# Patient Record
Sex: Male | Born: 1940
Health system: Southern US, Community
[De-identification: ages and names within clinical notes are randomized; demographics above are authoritative.]

## PROBLEM LIST (undated history)

## (undated) DIAGNOSIS — M7020 Olecranon bursitis, unspecified elbow: Secondary | ICD-10-CM

## (undated) DIAGNOSIS — N3281 Overactive bladder: Secondary | ICD-10-CM

## (undated) DIAGNOSIS — E291 Testicular hypofunction: Secondary | ICD-10-CM

## (undated) DIAGNOSIS — G629 Polyneuropathy, unspecified: Secondary | ICD-10-CM

## (undated) DIAGNOSIS — R9431 Abnormal electrocardiogram [ECG] [EKG]: Secondary | ICD-10-CM

## (undated) DIAGNOSIS — R42 Dizziness and giddiness: Secondary | ICD-10-CM

## (undated) DIAGNOSIS — R001 Bradycardia, unspecified: Secondary | ICD-10-CM

## (undated) DIAGNOSIS — I951 Orthostatic hypotension: Secondary | ICD-10-CM

## (undated) DIAGNOSIS — M47819 Spondylosis without myelopathy or radiculopathy, site unspecified: Secondary | ICD-10-CM

## (undated) DIAGNOSIS — F0390 Unspecified dementia without behavioral disturbance: Secondary | ICD-10-CM

## (undated) HISTORY — PX: TOTAL HIP ARTHROPLASTY: SHX124

## (undated) HISTORY — DX: Orthostatic hypotension: I95.1

## (undated) HISTORY — DX: Olecranon bursitis, unspecified elbow: M70.20

## (undated) HISTORY — DX: Overactive bladder: N32.81

## (undated) HISTORY — DX: Testicular hypofunction: E29.1

## (undated) HISTORY — DX: Spondylosis without myelopathy or radiculopathy, site unspecified: M47.819

## (undated) HISTORY — PX: UMBILICAL HERNIA REPAIR: SHX196

## (undated) HISTORY — DX: Dizziness and giddiness: R42

## (undated) HISTORY — DX: Unspecified dementia, unspecified severity, without behavioral disturbance, psychotic disturbance, mood disturbance, and anxiety: F03.90

## (undated) HISTORY — DX: Bradycardia, unspecified: R00.1

## (undated) HISTORY — DX: Abnormal electrocardiogram (ECG) (EKG): R94.31

## (undated) HISTORY — DX: Polyneuropathy, unspecified: G62.9

## (undated) HISTORY — PX: LUMBAR LAMINECTOMY: SHX95

---

## 2000-01-12 ENCOUNTER — Ambulatory Visit (HOSPITAL_BASED_OUTPATIENT_CLINIC_OR_DEPARTMENT_OTHER): Admission: RE | Admit: 2000-01-12 | Discharge: 2000-01-12 | Payer: Self-pay | Admitting: Urology

## 2000-04-27 ENCOUNTER — Ambulatory Visit (HOSPITAL_COMMUNITY): Admission: RE | Admit: 2000-04-27 | Discharge: 2000-04-27 | Payer: Self-pay | Admitting: Orthopedic Surgery

## 2000-04-27 ENCOUNTER — Encounter: Payer: Self-pay | Admitting: Orthopedic Surgery

## 2000-06-07 ENCOUNTER — Encounter: Payer: Self-pay | Admitting: Orthopedic Surgery

## 2000-06-14 ENCOUNTER — Encounter: Payer: Self-pay | Admitting: Orthopedic Surgery

## 2000-06-14 ENCOUNTER — Inpatient Hospital Stay (HOSPITAL_COMMUNITY): Admission: RE | Admit: 2000-06-14 | Discharge: 2000-06-17 | Payer: Self-pay | Admitting: Orthopedic Surgery

## 2001-04-14 ENCOUNTER — Encounter: Payer: Self-pay | Admitting: Internal Medicine

## 2001-04-14 ENCOUNTER — Encounter: Admission: RE | Admit: 2001-04-14 | Discharge: 2001-04-14 | Payer: Self-pay | Admitting: Internal Medicine

## 2001-06-07 ENCOUNTER — Ambulatory Visit (HOSPITAL_BASED_OUTPATIENT_CLINIC_OR_DEPARTMENT_OTHER): Admission: RE | Admit: 2001-06-07 | Discharge: 2001-06-07 | Payer: Self-pay | Admitting: Surgery

## 2002-09-06 ENCOUNTER — Emergency Department (HOSPITAL_COMMUNITY): Admission: EM | Admit: 2002-09-06 | Discharge: 2002-09-07 | Payer: Self-pay | Admitting: Emergency Medicine

## 2002-09-07 ENCOUNTER — Encounter: Payer: Self-pay | Admitting: Emergency Medicine

## 2003-09-06 ENCOUNTER — Ambulatory Visit (HOSPITAL_COMMUNITY): Admission: RE | Admit: 2003-09-06 | Discharge: 2003-09-06 | Payer: Self-pay | Admitting: Gastroenterology

## 2005-08-10 ENCOUNTER — Inpatient Hospital Stay (HOSPITAL_COMMUNITY): Admission: RE | Admit: 2005-08-10 | Discharge: 2005-08-12 | Payer: Self-pay | Admitting: Neurosurgery

## 2005-11-11 ENCOUNTER — Emergency Department (HOSPITAL_COMMUNITY): Admission: EM | Admit: 2005-11-11 | Discharge: 2005-11-11 | Payer: Self-pay | Admitting: Emergency Medicine

## 2006-04-29 ENCOUNTER — Ambulatory Visit: Payer: Self-pay | Admitting: Internal Medicine

## 2006-06-16 ENCOUNTER — Ambulatory Visit: Payer: Self-pay | Admitting: Internal Medicine

## 2007-05-10 ENCOUNTER — Encounter: Payer: Self-pay | Admitting: Internal Medicine

## 2007-05-10 DIAGNOSIS — I10 Essential (primary) hypertension: Secondary | ICD-10-CM | POA: Insufficient documentation

## 2007-05-10 DIAGNOSIS — M129 Arthropathy, unspecified: Secondary | ICD-10-CM | POA: Insufficient documentation

## 2007-05-17 ENCOUNTER — Encounter: Payer: Self-pay | Admitting: Internal Medicine

## 2007-07-11 ENCOUNTER — Telehealth: Payer: Self-pay | Admitting: Internal Medicine

## 2007-07-19 ENCOUNTER — Encounter: Payer: Self-pay | Admitting: Internal Medicine

## 2007-07-25 ENCOUNTER — Telehealth: Payer: Self-pay | Admitting: Internal Medicine

## 2007-08-15 ENCOUNTER — Encounter: Payer: Self-pay | Admitting: Internal Medicine

## 2007-10-24 ENCOUNTER — Encounter: Payer: Self-pay | Admitting: Internal Medicine

## 2007-11-09 ENCOUNTER — Encounter: Payer: Self-pay | Admitting: Internal Medicine

## 2010-12-05 NOTE — Op Note (Signed)
[place]. [facility]  Patient:    Ricky Black, Ricky Black Visit Number: [id number] MRN: [id number]          Service Type: DSU Location: [facility] Attending Physician:  Bonnetta Barry Dictated by:   Velora Heckler, M.D. Proc. Date: 06/07/01 Admit Date:  06/07/2001   CC:         Julieanne Manson, M.D.   Operative Report  PREOPERATIVE DIAGNOSIS:  Reducible umbilical hernia.  POSTOPERATIVE DIAGNOSIS:  Reducible umbilical hernia.  OPERATION PERFORMED:  Repair of umbilical hernia.  SURGEON:  Velora Heckler, M.D.  ANESTHESIA:  General.  ESTIMATED BLOOD LOSS:  Minimal.  PREPARATION:  Betadine.  COMPLICATIONS:  None.  INDICATIONS FOR PROCEDURE:  The patient is a 70 year old white male who presents with umbilical hernia.  This had first been noted in 1990.  It has been largely asymptomatic but increasing in size over the past few years.  It has become uncomfortable to the touch and bothers him with physical activity. He now comes to surgery for repair.  DESCRIPTION OF PROCEDURE:  The procedure was done in OR #3 at the [facility].  The patient was brought to the operating room and placed in supine position on the operating room table.  Following administration of general anesthesia, the patient was prepped and draped in the usual strict aseptic fashion.  After ascertaining that an adequate level of anesthesia had been obtained, an infraumbilical incision was made with a #15 blade. Dissection was carried down through the subcutaneous tissues to the fascia. The neck of the hernia was identified and the fascial plane was developed. Hernia sac was opened.  It contains omentum.  This was dissected away from the undersurface of the umbilicus and reduced back within the peritoneal cavity. Hernia defect was defined and debrided to healthy fascia.  There was good healthy, thick fascia apparent.  This was closed transversely with interrupted #1  Ethibond sutures.  There was no need for mesh application.  Good hemostasis was obtained.  The umbilicus was fixed back to the abdominal wall with an interrupted 3-0 Vicryl suture.  The subcutaneous tissues were reapproximated with interrupted 3-0 Vicryl sutures.  Skin edges were anesthetized with local Marcaine anesthetic.  Skin edges were reapproximated with interrupted 4-0 Vicryl subcuticular sutures.  The wound was washed and dried and benzoin and Steri-Strips were applied.  Sterile gauze dressings were applied.  The patient was awakened from anesthesia and brought to the recovery room in stable condition.  The patient tolerated the procedure well. Dictated by:   Velora Heckler, M.D. Attending Physician:  Bonnetta Barry DD:  06/07/01 TD:  06/07/01 Job: [id number]

## 2010-12-05 NOTE — Discharge Summary (Signed)
[facility]  Patient:    Ricky Black, Ricky Black                        MRN: [id number] Adm. Date:  [id number] Disc. Date: [id number] Attending:  Loanne Drilling Dictator:   Alexzandrew L. Perkins, P.A.C.                           Discharge Summary  ADMISSION DIAGNOSES: 1. Osteoarthritis of right hip. 2. Hypertension.  DISCHARGE DIAGNOSES: 1. Osteoarthritis of right hip status post right total hip replacement    arthroplasty. 2. Mild postoperative anemia. 3. Postoperative hypokalemia. 4. Hypertension.  PROCEDURE:  The patient was taken to the OR on June 14, 2000, underwent a right total hip replacement arthroplasty.  Surgeon was Dr. Ollen Gross, assistant was Ralene Bathe, P.A.C.  Surgery was under spinal anesthesia. Hemovac drain x1 placed at time of surgery.  HISTORY OF PRESENT ILLNESS:  The patient is a 70 year old male who has had significant history concerning his right hip.  He has had progressive pain and dysfunction due to known osteoarthritis.  He is seen in the office.  X-rays reveal advanced arthritis in both hips, however, the right is more pronounced then the left.  He is at a point which he wishes to have surgical intervention.  Risks and benefits of the total hip procedure have been discussed with the patient, and he has elected to proceed with surgery.  LABORATORY DATA:  CBC on admission showed a hemoglobin of 14.9, hematocrit of 41.9, white blood cell count 6.4, red cell count 4.61.  Differential within normal limits.  Postoperative hemoglobin dropped down to a level of 12.1, hematocrit of 34.3, last noted hemoglobin and hematocrit was 12.7 and 37.1. PT/PTT on admission were 14.4 and 30, respectively, with an INR of 1.2. Serial prothrombin times were followed per Coumadin protocol, last noted PT/INR were 17.6 and 1.7.  Chem panel on admission all within normal limits with the exception of a slightly low ALP of 31.  Followup BMET  postoperatively showed a drop in potassium from 3.8 to 3.4, increase in glucose from 98 to 142.  Last BMET noted potassium had come back up to 4.0, glucose dropped back down to 129.  Urinalysis on admission was negative.  Blood group type O+.  EKG dated June 07, 2000, showed normal sinus rhythm, normal EKG, no significant change since last tracing, confirmed by Dr. Roger Shelter. Chest x-ray dated June 07, 2000, negative chest for active disease.  Hip films dated preoperatively, dated June 07, 2000, degenerative changes are noted of the right hip.  Postoperative films on June 14, 2000, portable pelvis showed anatomic alignment of the right total hip arthroplasty, single view right hip, normal alignment of the femoral stem.  HOSPITAL COURSE:  The patient was admitted to [facility], taken to the OR on June 14, 2000, underwent the above stated procedure without complication.  The patient tolerated the procedure well, was later transferred to the recovery room and then the orthopedic floor for continued postoperative care.  The patient was placed on PCA analgesics for pain control following his surgery.  Hemovac drain which was placed at time of surgery was pulled on postoperative day #1.  The patient was noted to have some mild hypokalemia postoperatively on the followup BMET.  He was placed on potassium supplements and responded well.  He had some mild volume overload, and underwent some  mild diuresis and responded well.  The Is and Os improved throughout the hospital course.  The patient was slow to progress with physical therapy, only ambulating approximately 20 feet by postoperative day #1.  However, did very well and progressed up to 200 feet by postoperative day #2.  Dressing changes were initiated on postoperative day #2.  Wound was healing well.  Had some mild elevation in temperature on postoperative day #2 to 101.7.  He was treated with antipyretics  and incentive spirometer.  Temperature responded well, was afebrile by postoperative day #3.  By June 17, 2000, the patient had progressed very well with therapy, was working on ambulation and transfers.  Pain had been controlled.  He was weaned off his PCA over to p.o. analgesics, doing quite well, and was discharged home.  DISCHARGE PLAN:  Discharged home on June 17, 2000.  DISCHARGE DIAGNOSES:  Please see above.  DISCHARGE MEDICATIONS: 1. Percocet p.r.n. pain. 2. Robaxin p.r.n. spasm.  ACTIVITY:  He is touchdown weightbearing to the right lower extremity.  Hip precautions at all times.  He will be set up for home health PT and home health nursing.  FOLLOWUP:  Thursday or Friday following discharge.  Call the office for an appointment at [phone].  DIET:  Low sodium diet.  DISPOSITION:  Home.  CONDITION ON DISCHARGE:  Improved. DD:  07/30/00 TD:  07/31/00 Job: [id number]

## 2010-12-05 NOTE — Op Note (Signed)
NAME:  Ricky Black, Ricky Black                           ACCOUNT NO.:  [id number]   MEDICAL RECORD NO.:  [id number]                   PATIENT TYPE:  AMB   LOCATION:  ENDO                                 FACILITY:  [facility]   PHYSICIAN:  Graylin Shiver, M.D.                DATE OF BIRTH:  1941/01/05   DATE OF PROCEDURE:  09/06/2003  DATE OF DISCHARGE:                                 OPERATIVE REPORT   PROCEDURE:  Colonoscopy.   INDICATION FOR PROCEDURE:  Screening.   Informed consent was obtained after explanation of the risks of bleeding,  infection, and perforation.   PREMEDICATION:  Fentanyl 75 mcg IV and Versed 7 mg IV.   PROCEDURE IN DETAIL:  With the patient in the left lateral decubitus  position, a rectal exam was performed.  No masses were felt.  The Olympus  colonoscope was inserted into the rectum and advanced around the colon to  the cecum.  The cecal landmarks were identified.  The cecum and ascending  colon were normal.  The transverse colon, descending colon, sigmoid colon,  and rectum were normal.  He tolerated the procedure well without  complications.   IMPRESSION:  Normal colonoscopy to the cecum.   RECOMMENDATIONS:  I would recommend follow-up screening colonoscopy in 10  years.                                               Graylin Shiver, M.D.    Germain Osgood  D:  09/06/2003  T:  09/06/2003  Job:  [id number]

## 2010-12-05 NOTE — Op Note (Signed)
[facility]  Patient:    Ricky Black, Ricky Black                            MRN: [id number] Proc. Date: 06/14/00 Attending:  Trudee Grip, M.D.                           Operative Report  PREOPERATIVE DIAGNOSES:  Osteoarthritis, right hip.  POSTOPERATIVE DIAGNOSES:  Osteoarthritis, right hip.  PROCEDURE:  Right total hip arthroplasty.  SURGEON:  Dr. Lequita Halt.  ASSISTANT:  Shelbie Proctor, P.A.-C.  ANESTHESIA:  Spinal.  ESTIMATED BLOOD LOSS:  400.  DRAIN:  Hemovac x 1.  COMPLICATIONS:  None.  CONDITION:  Stable to recovery.  BRIEF CLINICAL NOTE:  Mr. Frankowski is a 70 year old male who has significant osteoarthritis of the right hip with progressive pain and dysfunction. He has failed nonoperative management and presents for right total hip arthroplasty.  DESCRIPTION OF PROCEDURE:  After successful administration of spinal anesthetic, the patient is placed in the left lateral decubitus position with the right side up and held with the hip positioner. The right lower extremity was then isolated from the perineum with plastic drapes and prepped and draped in the usual sterile fashion. A standard posterolateral incision was made, skin cut with a 10 blade through subcutaneous at the level of the fascia lata which is incised in line with the skin incision. The short external rotator was then isolated off the femur and capsulectomy performed. The hip was then dislocated and the center of the femoral head marked. The trial prosthesis is placed such that the center of the trial head corresponds to the center of his native femoral head. The osteotomy line is marked and then the femoral neck is cut with an oscillating saw. The femur is then retracted anteriorly and anterior capsule removed. At this point, acetabular preparation is initiated.  The labrum is removed. Acetabular reaming started with a 46 coursing in increments of 2 to a 54 and then a 55 is used. A 56 mm  pinnacle cup is then impacted into the acetabulum with 40 degrees of abduction and approximately 20 degrees of forward flexion matching his native anteversion. He had an excellent fit and then was also transfixed with 2 dome screws. A trial 28, 10 degree plus 4 liner is utilized. The high wall is then placed in the 10 oclock position.  Several preparations initiated first with the canal finder and the starter reamer. Axial reaming is performed all the way up to 19.5 mm. The proximal reaming is performed up to a 61F and the spout up to an extra extra large. A 61F XXL sleeve is placed. A 24 x 19 stem with a 36 plus 8 neck is placed with a 28 plus zero head. I put him in approximately 15 degrees of anteversion which was slightly more than his native anteversion. The hip is then reduced and he has got outstanding stability. Soft tissues were slightly tight thus we switched him to a neutral liner and he still had 70 degrees of flexion, 40 degrees adduction and 70 degrees internal rotation and 90 degrees of flexion and 90 degrees internal rotation and then full extension to full external rotation. Soft tissue tension is much more appropriate. Trials are then removed and the apex hole eliminator and permanent 28 mm neutral marathon liner is impacted into the acetabular shell. The permanent 61F XXL sleeve and 24  x 19 stem with 36 plus 8 neck are impacted into the femur. A trial plus 3 head is used and that is felt to be the most stable and match the tissue tension most appropriately. A permanent 28 plus 3 head is then utilized. Reduction is performed with the same stability parameters. The wound was copiously irrigated with antibiotic solution and the external rotators reattached to the femur through drill holes. The fascia lata and fascia of the gluteus maximus were then closed over 1 limb of the Hemovac drain with interrupted #1 Vicryl. The subcu was closed with interrupted 2-0 Vicryl,  skin with subcuticular stitch with 4-0 monocryl. The incision was cleaned and dried and Steri-Strips and a bulky sterile dressing applied. The drain was hooked to suction. The patient was placed into a knee immobilizer, awaken and transported to recovery in stable condition. DD:  06/14/00 TD:  06/14/00 Job: [id number]

## 2010-12-05 NOTE — Op Note (Signed)
[place]. [facility]  Patient:    Ricky Black, Ricky Black                          MRN: [id number] Proc. Date: 01/12/00 Adm. Date:  [id number] Disc. Date: [id number] Attending:  Laqueta Jean                           Operative Report  PREOPERATIVE DIAGNOSIS:  Benign prostatic hypertrophy.  POSTOPERATIVE DIAGNOSIS:  Benign prostatic hypertrophy.  OPERATION:  Transurethral needle ablation of prostate (TUNA).  SURGEON:  Sigmund I. Patsi Sears, M.D.  ANESTHESIA:  Local.  PREPARATION:  After the usual and customary pretreatment medications including pain medicine, antispasmodic medication and antibiotic, the patient was brought to the operating room and placed on the operating table in a dorsal low ____ dorsal lithotomy position and the pubis was prepped with Betadine solution and draped in the usual fashion.  PROCEDURE:  The transurethral needle scope was placed in the prostatic urethra, and 6 areas of ablation were accomplished.  Following this, the scope was removed and the Foley catheter was placed and irrigated.  The patient was taken to the recovery room in excellent condition.DD:  01/12/00 TD:  01/13/00 Job: [id number]

## 2010-12-05 NOTE — Op Note (Signed)
[name], Ricky Black                 ACCOUNT NO.:  [id number]   MEDICAL RECORD NO.:  [id number]          PATIENT TYPE:  INP   LOCATION:  2899                         FACILITY:  [facility]   PHYSICIAN:  Hewitt Shorts, M.D.DATE OF BIRTH:  11-27-1940   DATE OF PROCEDURE:  08/10/2005  DATE OF DISCHARGE:                                 OPERATIVE REPORT   PREOPERATIVE DIAGNOSIS:  Lumbar stenosis, lumbar spondylosis, lumbar  degenerative disk disease and neurogenic claudication.   POSTOPERATIVE DIAGNOSIS:  Lumbar stenosis, lumbar spondylosis, lumbar  degenerative disk disease and neurogenic claudication.   OPERATION PERFORMED:  L4 to S1 decompressive lumbar laminectomy with  microdissection.   SURGEON:  Hewitt Shorts, M.D.   ASSISTANT:  Hilda Lias, M.D.   ANESTHESIA:  General endotracheal.   INDICATIONS FOR PROCEDURE:  The patient is a 70 year old man who presented  with neurogenic claudication and was found by MRI scan to have significant  lumbar stenosis.  A decision was made to proceed with decompressive lumbar  laminectomy.   DESCRIPTION OF PROCEDURE:  The patient was brought to the operating room and  placed under general endotracheal anesthesia.  The patient was turned to a  prone position.  Lumbar region was prepped with Betadine soap and solution  and draped in sterile fashion.  The midline was infiltrated with local  anesthetic with epinephrine.  X-ray was taken and the L4-5 level identified.  A midline incision was made and carried down to the subcutaneous tissue.  Bipolar cautery and electrocautery were used to maintain hemostasis.  Dissection was carried down to the lumbar fascia which was incised  bilaterally and the paraspinal muscles were dissected from the spinous  process and lamina in subperiosteal fashion.  The  interlaminar space was  identified and x-ray was taken and the L5-S1, L4-5 interlaminar space  identified.  A laminectomy was performed using  double action rongeurs and  the X-Max drill and Kerrison punches.  Superior S1, complete L5 and inferior  L4 laminectomy was performed.  The ligamentum flavum at each level was  markedly thickened. There was significant stenosis at each level.  At points  the ligamentum flavum was adherent to the dura and two small rents of the  dura occurred.  These were closed with 6-0 Prolene suture, tested against  Valsalva with good watertight closure documented.  Care was taken to  decompress the thecal sac within the central part of the spinous canal as  well as the exiting nerve roots including the L4, L5 and S1 nerve roots  bilaterally.  Once decompression was completed and hemostasis was  established, Tisseel was injected over the dural repairs and then we  proceeded with closure.  The paraspinal muscle were approximated with  interrupted, undyed 1 Vicryl suture, deep fascia was closed with #1  interrupted undyed Vicryl sutures, the Scarpa's fascia closed with  interrupted undyed 1 and 2-0 undyed Vicryl sutures and the subcutaneous and  subcuticular layer were closed with interrupted inverted 2-0 and 3-0 undyed  Vicryl sutures and skin edges approximated with Dermabond.  The wound was  dressed with  Adaptic and sterile gauze.  The patient tolerated the procedure  well.  The estimated blood loss for  this procedure was 100 mL.  Sponge, needle and instrument counts were  correct.  Following surgery the patient was turned back to supine position  to be reversed from anesthetic, extubated and transferred to recovery room  for further care.      Hewitt Shorts, M.D.  Electronically Signed     RWN/MEDQ  D:  08/10/2005  T:  08/10/2005  Job:  [id number]

## 2010-12-05 NOTE — Op Note (Signed)
[place]. [facility]  Patient:    Ricky Black, Ricky Black                          MRN: [id number] Proc. Date: 01/12/00 Attending:  Vonzell Schlatter. Patsi Sears, M.D.                           Operative Report  PREOPERATIVE DIAGNOSIS:  Benign prostatic hypertrophy.  POSTOPERATIVE DIAGNOSIS:  Benign prostatic hypertrophy.  OPERATION:  Transurethral needle ablation of prostate (TUNA).  SURGEON:  Sigmund I. Patsi Sears, M.D.  ANESTHESIA:  Local.  PREPARATION:  After usual and customary pretreatment medications, including pain medicine, antispasmodic medication, antibiotic, the patient was brought to the operating room and placed on the operating table in the dorsal low Allen stirrup dorsal lithotomy position and the pubis was prepped with Betadine solution and draped in usual fashion.  PROCEDURE:  The transurethral needle scope was placed in the prostatic urethra, and six areas of ablation were accomplished.  Following this, the scope was removed, and Foley catheter was placed and irrigated.  The patient was taken to recovery room in excellent condition. DD:  01/12/00 TD:  01/13/00 Job: [id number]

## 2011-08-03 DIAGNOSIS — E291 Testicular hypofunction: Secondary | ICD-10-CM | POA: Diagnosis not present

## 2011-08-11 DIAGNOSIS — N312 Flaccid neuropathic bladder, not elsewhere classified: Secondary | ICD-10-CM | POA: Diagnosis not present

## 2011-08-11 DIAGNOSIS — E291 Testicular hypofunction: Secondary | ICD-10-CM | POA: Diagnosis not present

## 2011-08-19 DIAGNOSIS — H2510 Age-related nuclear cataract, unspecified eye: Secondary | ICD-10-CM | POA: Diagnosis not present

## 2011-09-16 DIAGNOSIS — M961 Postlaminectomy syndrome, not elsewhere classified: Secondary | ICD-10-CM | POA: Diagnosis not present

## 2011-09-30 DIAGNOSIS — M545 Low back pain: Secondary | ICD-10-CM | POA: Diagnosis not present

## 2011-11-30 DIAGNOSIS — E291 Testicular hypofunction: Secondary | ICD-10-CM | POA: Diagnosis not present

## 2011-12-07 DIAGNOSIS — N401 Enlarged prostate with lower urinary tract symptoms: Secondary | ICD-10-CM | POA: Diagnosis not present

## 2011-12-07 DIAGNOSIS — E291 Testicular hypofunction: Secondary | ICD-10-CM | POA: Diagnosis not present

## 2011-12-07 DIAGNOSIS — N312 Flaccid neuropathic bladder, not elsewhere classified: Secondary | ICD-10-CM | POA: Diagnosis not present

## 2012-01-05 DIAGNOSIS — M961 Postlaminectomy syndrome, not elsewhere classified: Secondary | ICD-10-CM | POA: Diagnosis not present

## 2012-03-03 DIAGNOSIS — M545 Low back pain: Secondary | ICD-10-CM | POA: Diagnosis not present

## 2012-03-03 DIAGNOSIS — M5137 Other intervertebral disc degeneration, lumbosacral region: Secondary | ICD-10-CM | POA: Diagnosis not present

## 2012-03-08 DIAGNOSIS — M169 Osteoarthritis of hip, unspecified: Secondary | ICD-10-CM | POA: Diagnosis not present

## 2012-03-16 DIAGNOSIS — M545 Low back pain: Secondary | ICD-10-CM | POA: Diagnosis not present

## 2012-03-16 DIAGNOSIS — M47817 Spondylosis without myelopathy or radiculopathy, lumbosacral region: Secondary | ICD-10-CM | POA: Diagnosis not present

## 2012-03-29 DIAGNOSIS — Z23 Encounter for immunization: Secondary | ICD-10-CM | POA: Diagnosis not present

## 2012-04-08 DIAGNOSIS — I10 Essential (primary) hypertension: Secondary | ICD-10-CM | POA: Diagnosis not present

## 2012-04-08 DIAGNOSIS — Z125 Encounter for screening for malignant neoplasm of prostate: Secondary | ICD-10-CM | POA: Diagnosis not present

## 2012-04-15 DIAGNOSIS — Z0000 Encounter for general adult medical examination without abnormal findings: Secondary | ICD-10-CM | POA: Diagnosis not present

## 2012-04-15 DIAGNOSIS — I10 Essential (primary) hypertension: Secondary | ICD-10-CM | POA: Diagnosis not present

## 2012-04-15 DIAGNOSIS — M545 Low back pain: Secondary | ICD-10-CM | POA: Diagnosis not present

## 2012-04-15 DIAGNOSIS — Z23 Encounter for immunization: Secondary | ICD-10-CM | POA: Diagnosis not present

## 2012-04-15 DIAGNOSIS — G589 Mononeuropathy, unspecified: Secondary | ICD-10-CM | POA: Diagnosis not present

## 2012-04-15 HISTORY — PX: COLONOSCOPY: SHX174

## 2012-04-19 DIAGNOSIS — Z1212 Encounter for screening for malignant neoplasm of rectum: Secondary | ICD-10-CM | POA: Diagnosis not present

## 2012-05-04 DIAGNOSIS — M76899 Other specified enthesopathies of unspecified lower limb, excluding foot: Secondary | ICD-10-CM | POA: Diagnosis not present

## 2012-06-03 DIAGNOSIS — E291 Testicular hypofunction: Secondary | ICD-10-CM | POA: Diagnosis not present

## 2012-06-09 DIAGNOSIS — M76899 Other specified enthesopathies of unspecified lower limb, excluding foot: Secondary | ICD-10-CM | POA: Diagnosis not present

## 2012-06-14 DIAGNOSIS — N400 Enlarged prostate without lower urinary tract symptoms: Secondary | ICD-10-CM | POA: Diagnosis not present

## 2012-06-29 DIAGNOSIS — M47817 Spondylosis without myelopathy or radiculopathy, lumbosacral region: Secondary | ICD-10-CM | POA: Diagnosis not present

## 2012-07-06 DIAGNOSIS — H2510 Age-related nuclear cataract, unspecified eye: Secondary | ICD-10-CM | POA: Diagnosis not present

## 2012-07-15 DIAGNOSIS — Z1331 Encounter for screening for depression: Secondary | ICD-10-CM | POA: Diagnosis not present

## 2012-07-15 DIAGNOSIS — F0390 Unspecified dementia without behavioral disturbance: Secondary | ICD-10-CM | POA: Diagnosis not present

## 2012-07-22 DIAGNOSIS — F0390 Unspecified dementia without behavioral disturbance: Secondary | ICD-10-CM | POA: Diagnosis not present

## 2012-08-23 DIAGNOSIS — H2510 Age-related nuclear cataract, unspecified eye: Secondary | ICD-10-CM | POA: Diagnosis not present

## 2012-10-13 DIAGNOSIS — F0390 Unspecified dementia without behavioral disturbance: Secondary | ICD-10-CM | POA: Diagnosis not present

## 2012-11-03 DIAGNOSIS — I951 Orthostatic hypotension: Secondary | ICD-10-CM | POA: Diagnosis not present

## 2012-11-03 DIAGNOSIS — Z6827 Body mass index (BMI) 27.0-27.9, adult: Secondary | ICD-10-CM | POA: Diagnosis not present

## 2012-11-03 DIAGNOSIS — I498 Other specified cardiac arrhythmias: Secondary | ICD-10-CM | POA: Diagnosis not present

## 2012-11-03 DIAGNOSIS — R42 Dizziness and giddiness: Secondary | ICD-10-CM | POA: Diagnosis not present

## 2012-11-07 ENCOUNTER — Encounter: Payer: Self-pay | Admitting: Cardiology

## 2012-11-07 ENCOUNTER — Ambulatory Visit (INDEPENDENT_AMBULATORY_CARE_PROVIDER_SITE_OTHER): Payer: Medicare Other | Admitting: Cardiology

## 2012-11-07 ENCOUNTER — Encounter: Payer: Self-pay | Admitting: *Deleted

## 2012-11-07 VITALS — BP 115/75 | HR 65 | Ht 74.0 in | Wt 213.1 lb

## 2012-11-07 DIAGNOSIS — R9431 Abnormal electrocardiogram [ECG] [EKG]: Secondary | ICD-10-CM

## 2012-11-07 DIAGNOSIS — M47819 Spondylosis without myelopathy or radiculopathy, site unspecified: Secondary | ICD-10-CM

## 2012-11-07 DIAGNOSIS — M479 Spondylosis, unspecified: Secondary | ICD-10-CM

## 2012-11-07 DIAGNOSIS — I951 Orthostatic hypotension: Secondary | ICD-10-CM | POA: Diagnosis not present

## 2012-11-07 DIAGNOSIS — E291 Testicular hypofunction: Secondary | ICD-10-CM

## 2012-11-07 DIAGNOSIS — G629 Polyneuropathy, unspecified: Secondary | ICD-10-CM

## 2012-11-07 DIAGNOSIS — I498 Other specified cardiac arrhythmias: Secondary | ICD-10-CM

## 2012-11-07 DIAGNOSIS — G589 Mononeuropathy, unspecified: Secondary | ICD-10-CM

## 2012-11-07 DIAGNOSIS — R001 Bradycardia, unspecified: Secondary | ICD-10-CM

## 2012-11-07 DIAGNOSIS — R42 Dizziness and giddiness: Secondary | ICD-10-CM | POA: Diagnosis not present

## 2012-11-07 DIAGNOSIS — F0390 Unspecified dementia without behavioral disturbance: Secondary | ICD-10-CM | POA: Insufficient documentation

## 2012-11-07 NOTE — Assessment & Plan Note (Signed)
Unfortunately he is having a significant problem with memory. This is being treated by his primary team.

## 2012-11-07 NOTE — Assessment & Plan Note (Signed)
The patient describes his symptoms last week as lightheadedness. He did not have syncope or presyncope. He feels better now that his lisinopril has been stopped. No further workup.

## 2012-11-07 NOTE — Assessment & Plan Note (Signed)
There was question of orthostatic hypotension on April 17. Today his blood pressures were 127/76 with a pulse of 52 lying,   126/69 with a pulse of 53 sitting,   116/65 with a pulse of 62 upon immediate standing,    127/74 with a pulse of 61 after 2 minutes standing,    116/75 with a pulse of 65 after standing for 5 minutes. There is slight blood pressure change but no marked abnormalities. He had no significant symptoms. I have encouraged him to remain off his lisinopril and to be sure that he remains hydrated.

## 2012-11-07 NOTE — Patient Instructions (Signed)
Your physician recommends that you schedule a follow-up appointment in:  3 MONTHS WITH DR Myrtis Ser Your physician recommends that you continue on your current medications as directed. Please refer to the Current Medication list given to you today.

## 2012-11-07 NOTE — Assessment & Plan Note (Signed)
The current EKG reveals a small R wave in V2. Overall there is no diagnostic abnormality. I reviewed with the patient's wife that we could proceed with 2-D echo to fully assess LV wall motion. I told him that I did not think that that was necessary at this time and they agreed.  The plan at this point is to stay off lisinopril. He is to go about full physical activities and be careful to remain hydrated. If he has any further problems I would be happy to see him for further cardiac evaluation.

## 2012-11-07 NOTE — Progress Notes (Signed)
Patient ID: Ricky Black, male   DOB: 12-Nov-1940, 72 y.o.   MRN: [id number]   HPI  The patient is seen as a new patient evaluation for bradycardia, lightheadedness, and question of orthostatic hypotension, and abnormal EKG. The patient has no prior cardiac history. In the past he had done some long-distance running. He's not had any chest pain or shortness of breath. On November 03, 2012 he was seen by the team helping to cover Dr. Eric Form. On that day he said that he felt lightheaded. He did not have syncope or presyncope. Blood pressures were obtained and there was question of orthostasis. His heart rate also was slow. His lisinopril was stopped. He was referred for further evaluation. He has felt better since his lisinopril was stopped on April 17.  Also an EKG was done. It showed sinus bradycardia. It was read as showing that an old septal infarct could not be ruled out.  The patient has developed a significant memory problem. Aricept has been started.  No Known Allergies  Current Outpatient Prescriptions  Medication Sig Dispense Refill  . ACAI PO Take 100 mg by mouth 2 (two) times daily as needed.      Marland Kitchen acetaminophen (TYLENOL) 325 MG tablet Take 650 mg by mouth as directed.      . bethanechol (URECHOLINE) 25 MG tablet Take 25 mg by mouth daily.      . celecoxib (CELEBREX) 200 MG capsule Take 200 mg by mouth daily.      . cyclobenzaprine (FLEXERIL) 10 MG tablet Take 10 mg by mouth 3 (three) times daily as needed for muscle spasms.      Marland Kitchen donepezil (ARICEPT) 10 MG tablet Take 10 mg by mouth at bedtime as needed.      . DULoxetine (CYMBALTA) 60 MG capsule Take 60 mg by mouth daily.      . finasteride (PROSCAR) 5 MG tablet Take 5 mg by mouth daily.      Marland Kitchen HYDROcodone-acetaminophen (NORCO) 7.5-325 MG per tablet Take 1 tablet by mouth every 6 (six) hours as needed for pain.      . Saw Palmetto, Serenoa repens, (SAW PALMETTO PO) Take 450 mg by mouth daily.      . tamsulosin (FLOMAX) 0.4 MG CAPS  Take 0.4 mg by mouth daily.      . Testosterone (AXIRON) 30 MG/ACT SOLN Place onto the skin as directed.       No current facility-administered medications for this visit.    History   Social History  . Marital Status: Married    Spouse Name: N/A    Number of Children: N/A  . Years of Education: N/A   Occupational History  . Not on file.   Social History Main Topics  . Smoking status: Former Games developer  . Smokeless tobacco: Not on file  . Alcohol Use: No  . Drug Use: Not on file  . Sexually Active: Not on file   Other Topics Concern  . Not on file   Social History Narrative  . No narrative on file    History reviewed. No pertinent family history.  Past Medical History  Diagnosis Date  . Dementia   . Hypogonadism male   . Neuropathy     (R) Dorsal foot  . Spinal arthritis     ESI  . OAB (overactive bladder)   . Olecranon bursitis   . Orthostatic hypotension   . Dizziness   . Bradycardia     Past Surgical History  Procedure Laterality Date  . Umbilical hernia repair    . Total hip arthroplasty      Right  . Lumbar laminectomy    . Colonoscopy  04/15/2012    Patient Active Problem List  Diagnosis  . HYPERTENSION  . ARTHRITIS  . Bradycardia  . Dizziness  . Orthostatic hypotension  . Spinal arthritis  . Neuropathy  . Hypogonadism male  . Dementia    ROS   Patient denies fever, chills, headache, sweats, rash, change in vision, change in hearing, chest pain, cough, nausea vomiting, urinary symptoms. All other systems are reviewed and are negative.  PHYSICAL EXAM  The patient is here with his wife. He seems oriented to person time and place. He does have difficulty with short-term memory. There is no jugulovenous distention. Lungs are clear. Respiratory effort is nonlabored. Cardiac exam reveals an S1 and S2. There no clicks or significant murmurs. The abdomen is soft. There is no peripheral edema. There no musculoskeletal deformities. There are no skin  rashes.  Filed Vitals:   11/07/12 1535 11/07/12 1538 11/07/12 1539 11/07/12 1540  BP: 126/69 116/65 127/74 115/75  Pulse: 53 62 61 65  Height:      Weight:      SpO2:       EKG is done today and reviewed by me. EKG today shows a rate of 51. There is a very tiny R wave in lead V2. There is a tracing from April 15, 2012. It was read as showing possible septal infarct age undetermined.  ASSESSMENT & PLAN

## 2012-11-07 NOTE — Assessment & Plan Note (Signed)
While in the office today the patient was stable. His heart rate at rest was in the range of 54. We exercised him in the office and he walked quite well. After walking in our long halls for multiple trips down the hall his heart rate was up to 122. This argues against chronotropic incompetence. The patient has not had syncope or presyncope. It does not appear that he is having any symptoms from his bradycardia. His wife does mention that his heart rate has been on the slow side over the years. We considered having him wear a Holter monitor. However I feel that that is not necessary at this time. The patient's wife understands and agrees.

## 2012-11-21 DIAGNOSIS — M47817 Spondylosis without myelopathy or radiculopathy, lumbosacral region: Secondary | ICD-10-CM | POA: Diagnosis not present

## 2012-12-14 DIAGNOSIS — M47817 Spondylosis without myelopathy or radiculopathy, lumbosacral region: Secondary | ICD-10-CM | POA: Diagnosis not present

## 2013-02-14 ENCOUNTER — Encounter: Payer: Self-pay | Admitting: Cardiology

## 2013-02-14 ENCOUNTER — Ambulatory Visit (INDEPENDENT_AMBULATORY_CARE_PROVIDER_SITE_OTHER): Payer: Medicare Other | Admitting: Cardiology

## 2013-02-14 VITALS — BP 104/62 | HR 66 | Ht 74.0 in | Wt 217.8 lb

## 2013-02-14 DIAGNOSIS — R42 Dizziness and giddiness: Secondary | ICD-10-CM

## 2013-02-14 DIAGNOSIS — I498 Other specified cardiac arrhythmias: Secondary | ICD-10-CM | POA: Diagnosis not present

## 2013-02-14 DIAGNOSIS — R001 Bradycardia, unspecified: Secondary | ICD-10-CM

## 2013-02-14 NOTE — Assessment & Plan Note (Signed)
Unfortunately there is a memory problem. This being treated by primary care.

## 2013-02-14 NOTE — Patient Instructions (Addendum)
**Note De-identified  Obfuscation** Your physician recommends that you continue on your current medications as directed. Please refer to the Current Medication list given to you today.  Your physician wants you to follow-up in: 1 year. You will receive a reminder letter in the mail two months in advance. If you don't receive a letter, please call our office to schedule the follow-up appointment.  

## 2013-02-14 NOTE — Progress Notes (Signed)
   HPI   Patient is here to followup his cardiac workup. I saw him November 07, 2012. He has some bradycardia and lightheadedness. His blood pressure was on the low side. His pulse was slow. We walked him in the office and his pulse increased. There was no definite evidence of chronotropic incompetence. He had already put his lisinopril on hold. I encouraged him to remain off lisinopril. He returns today feeling fine.  No Known Allergies  Current Outpatient Prescriptions  Medication Sig Dispense Refill  . acetaminophen (TYLENOL) 325 MG tablet Take 650 mg by mouth as directed.      . bethanechol (URECHOLINE) 25 MG tablet Take 25 mg by mouth daily.      . celecoxib (CELEBREX) 200 MG capsule Take 200 mg by mouth daily.      . finasteride (PROSCAR) 5 MG tablet Take 5 mg by mouth daily.      Marland Kitchen HYDROcodone-acetaminophen (NORCO) 7.5-325 MG per tablet Take 1 tablet by mouth every 6 (six) hours as needed for pain.       No current facility-administered medications for this visit.    History   Social History  . Marital Status: Married    Spouse Name: N/A    Number of Children: N/A  . Years of Education: N/A   Occupational History  . Not on file.   Social History Main Topics  . Smoking status: Former Games developer  . Smokeless tobacco: Not on file  . Alcohol Use: No  . Drug Use: Not on file  . Sexually Active: Not on file   Other Topics Concern  . Not on file   Social History Narrative  . No narrative on file    No family history on file.  Past Medical History  Diagnosis Date  . Dementia   . Hypogonadism male   . Neuropathy     (R) Dorsal foot  . Spinal arthritis     ESI  . OAB (overactive bladder)   . Olecranon bursitis   . Orthostatic hypotension   . Dizziness   . Bradycardia   . Abnormal EKG     Prior tracing  with reading can't rule out old septal MI    Past Surgical History  Procedure Laterality Date  . Umbilical hernia repair    . Total hip arthroplasty      Right    . Lumbar laminectomy    . Colonoscopy  04/15/2012    Patient Active Problem List   Diagnosis Date Noted  . Bradycardia   . Dizziness   . Orthostatic hypotension   . Spinal arthritis   . Neuropathy   . Hypogonadism male   . Dementia   . Abnormal EKG   . HYPERTENSION 05/10/2007  . ARTHRITIS 05/10/2007    ROS   Patient denies fever, chills, headache, sweats, rash, change in vision, change in hearing, chest pain, cough, nausea vomiting, urinary symptoms. All other systems are reviewed and are negative.  PHYSICAL EXAM  Patient is oriented to person time and place. Affect is normal. He's here with his wife. Lungs are clear. Respiratory effort is nonlabored. Cardiac exam reveals S1 and S2. There no clicks or significant murmurs. The abdomen is soft. There is no peripheral edema.  Filed Vitals:   02/14/13 0920  BP: 104/62  Pulse: 66  Height: 6\' 2"  (1.88 m)  Weight: 217 lb 12.8 oz (98.793 kg)  SpO2: 97%     ASSESSMENT & PLAN

## 2013-02-14 NOTE — Assessment & Plan Note (Signed)
Heart rate is stable. No change in therapy. 

## 2013-02-14 NOTE — Assessment & Plan Note (Signed)
He's not having any further dizziness. No further workup.

## 2013-02-15 DIAGNOSIS — N312 Flaccid neuropathic bladder, not elsewhere classified: Secondary | ICD-10-CM | POA: Diagnosis not present

## 2013-02-15 DIAGNOSIS — N400 Enlarged prostate without lower urinary tract symptoms: Secondary | ICD-10-CM | POA: Diagnosis not present

## 2013-02-15 DIAGNOSIS — R3129 Other microscopic hematuria: Secondary | ICD-10-CM | POA: Diagnosis not present

## 2013-02-15 DIAGNOSIS — N401 Enlarged prostate with lower urinary tract symptoms: Secondary | ICD-10-CM | POA: Diagnosis not present

## 2013-02-15 DIAGNOSIS — M545 Low back pain: Secondary | ICD-10-CM | POA: Diagnosis not present

## 2013-02-15 DIAGNOSIS — R310 Gross hematuria: Secondary | ICD-10-CM | POA: Diagnosis not present

## 2013-03-01 DIAGNOSIS — R310 Gross hematuria: Secondary | ICD-10-CM | POA: Diagnosis not present

## 2013-03-01 DIAGNOSIS — N281 Cyst of kidney, acquired: Secondary | ICD-10-CM | POA: Diagnosis not present

## 2013-03-08 DIAGNOSIS — R310 Gross hematuria: Secondary | ICD-10-CM | POA: Diagnosis not present

## 2013-03-08 DIAGNOSIS — N401 Enlarged prostate with lower urinary tract symptoms: Secondary | ICD-10-CM | POA: Diagnosis not present

## 2013-03-08 DIAGNOSIS — N400 Enlarged prostate without lower urinary tract symptoms: Secondary | ICD-10-CM | POA: Diagnosis not present

## 2013-03-22 DIAGNOSIS — M47817 Spondylosis without myelopathy or radiculopathy, lumbosacral region: Secondary | ICD-10-CM | POA: Diagnosis not present

## 2013-04-17 DIAGNOSIS — I10 Essential (primary) hypertension: Secondary | ICD-10-CM | POA: Diagnosis not present

## 2013-04-17 DIAGNOSIS — Z125 Encounter for screening for malignant neoplasm of prostate: Secondary | ICD-10-CM | POA: Diagnosis not present

## 2013-04-21 DIAGNOSIS — Z23 Encounter for immunization: Secondary | ICD-10-CM | POA: Diagnosis not present

## 2013-04-21 DIAGNOSIS — I951 Orthostatic hypotension: Secondary | ICD-10-CM | POA: Diagnosis not present

## 2013-04-21 DIAGNOSIS — R3129 Other microscopic hematuria: Secondary | ICD-10-CM | POA: Diagnosis not present

## 2013-04-21 DIAGNOSIS — R42 Dizziness and giddiness: Secondary | ICD-10-CM | POA: Diagnosis not present

## 2013-04-21 DIAGNOSIS — Z87898 Personal history of other specified conditions: Secondary | ICD-10-CM | POA: Diagnosis not present

## 2013-04-21 DIAGNOSIS — M545 Low back pain: Secondary | ICD-10-CM | POA: Diagnosis not present

## 2013-04-21 DIAGNOSIS — F0390 Unspecified dementia without behavioral disturbance: Secondary | ICD-10-CM | POA: Diagnosis not present

## 2013-04-21 DIAGNOSIS — M48061 Spinal stenosis, lumbar region without neurogenic claudication: Secondary | ICD-10-CM | POA: Diagnosis not present

## 2013-04-21 DIAGNOSIS — I10 Essential (primary) hypertension: Secondary | ICD-10-CM | POA: Diagnosis not present

## 2013-04-21 DIAGNOSIS — Z0000 Encounter for general adult medical examination without abnormal findings: Secondary | ICD-10-CM | POA: Diagnosis not present

## 2013-04-27 DIAGNOSIS — Z1212 Encounter for screening for malignant neoplasm of rectum: Secondary | ICD-10-CM | POA: Diagnosis not present

## 2013-06-28 DIAGNOSIS — M47817 Spondylosis without myelopathy or radiculopathy, lumbosacral region: Secondary | ICD-10-CM | POA: Diagnosis not present

## 2013-07-05 DIAGNOSIS — Z6828 Body mass index (BMI) 28.0-28.9, adult: Secondary | ICD-10-CM | POA: Diagnosis not present

## 2013-07-05 DIAGNOSIS — I10 Essential (primary) hypertension: Secondary | ICD-10-CM | POA: Diagnosis not present

## 2013-08-23 DIAGNOSIS — H2510 Age-related nuclear cataract, unspecified eye: Secondary | ICD-10-CM | POA: Diagnosis not present

## 2013-08-23 DIAGNOSIS — H40019 Open angle with borderline findings, low risk, unspecified eye: Secondary | ICD-10-CM | POA: Diagnosis not present

## 2013-08-23 DIAGNOSIS — Z961 Presence of intraocular lens: Secondary | ICD-10-CM | POA: Diagnosis not present

## 2013-08-25 DIAGNOSIS — R82998 Other abnormal findings in urine: Secondary | ICD-10-CM | POA: Diagnosis not present

## 2013-08-25 DIAGNOSIS — N312 Flaccid neuropathic bladder, not elsewhere classified: Secondary | ICD-10-CM | POA: Diagnosis not present

## 2013-08-25 DIAGNOSIS — R3129 Other microscopic hematuria: Secondary | ICD-10-CM | POA: Diagnosis not present

## 2013-08-25 DIAGNOSIS — N401 Enlarged prostate with lower urinary tract symptoms: Secondary | ICD-10-CM | POA: Diagnosis not present

## 2013-08-29 DIAGNOSIS — N400 Enlarged prostate without lower urinary tract symptoms: Secondary | ICD-10-CM | POA: Diagnosis not present

## 2013-08-29 DIAGNOSIS — E291 Testicular hypofunction: Secondary | ICD-10-CM | POA: Diagnosis not present

## 2013-08-29 DIAGNOSIS — N312 Flaccid neuropathic bladder, not elsewhere classified: Secondary | ICD-10-CM | POA: Diagnosis not present

## 2013-08-29 DIAGNOSIS — N401 Enlarged prostate with lower urinary tract symptoms: Secondary | ICD-10-CM | POA: Diagnosis not present

## 2013-08-29 DIAGNOSIS — R3129 Other microscopic hematuria: Secondary | ICD-10-CM | POA: Diagnosis not present

## 2013-08-29 DIAGNOSIS — N390 Urinary tract infection, site not specified: Secondary | ICD-10-CM | POA: Diagnosis not present

## 2013-08-29 DIAGNOSIS — N139 Obstructive and reflux uropathy, unspecified: Secondary | ICD-10-CM | POA: Diagnosis not present

## 2013-08-30 DIAGNOSIS — N401 Enlarged prostate with lower urinary tract symptoms: Secondary | ICD-10-CM | POA: Diagnosis not present

## 2013-08-30 DIAGNOSIS — R339 Retention of urine, unspecified: Secondary | ICD-10-CM | POA: Diagnosis not present

## 2013-10-03 DIAGNOSIS — N139 Obstructive and reflux uropathy, unspecified: Secondary | ICD-10-CM | POA: Diagnosis not present

## 2013-10-03 DIAGNOSIS — N312 Flaccid neuropathic bladder, not elsewhere classified: Secondary | ICD-10-CM | POA: Diagnosis not present

## 2013-10-03 DIAGNOSIS — N401 Enlarged prostate with lower urinary tract symptoms: Secondary | ICD-10-CM | POA: Diagnosis not present

## 2013-10-03 DIAGNOSIS — M47817 Spondylosis without myelopathy or radiculopathy, lumbosacral region: Secondary | ICD-10-CM | POA: Diagnosis not present

## 2013-11-16 DIAGNOSIS — M545 Low back pain, unspecified: Secondary | ICD-10-CM | POA: Diagnosis not present

## 2013-11-16 DIAGNOSIS — I10 Essential (primary) hypertension: Secondary | ICD-10-CM | POA: Diagnosis not present

## 2013-11-16 DIAGNOSIS — G309 Alzheimer's disease, unspecified: Secondary | ICD-10-CM | POA: Diagnosis not present

## 2013-11-16 DIAGNOSIS — F0280 Dementia in other diseases classified elsewhere without behavioral disturbance: Secondary | ICD-10-CM | POA: Diagnosis not present

## 2013-11-16 DIAGNOSIS — Z6829 Body mass index (BMI) 29.0-29.9, adult: Secondary | ICD-10-CM | POA: Diagnosis not present

## 2013-12-13 DIAGNOSIS — H9190 Unspecified hearing loss, unspecified ear: Secondary | ICD-10-CM | POA: Diagnosis not present

## 2013-12-13 DIAGNOSIS — H6120 Impacted cerumen, unspecified ear: Secondary | ICD-10-CM | POA: Diagnosis not present

## 2013-12-19 DIAGNOSIS — M47817 Spondylosis without myelopathy or radiculopathy, lumbosacral region: Secondary | ICD-10-CM | POA: Diagnosis not present

## 2014-02-15 ENCOUNTER — Ambulatory Visit: Payer: PRIVATE HEALTH INSURANCE | Admitting: Cardiology

## 2014-03-06 ENCOUNTER — Encounter: Payer: Self-pay | Admitting: Cardiology

## 2014-03-14 DIAGNOSIS — M47817 Spondylosis without myelopathy or radiculopathy, lumbosacral region: Secondary | ICD-10-CM | POA: Diagnosis not present

## 2014-04-10 DIAGNOSIS — N529 Male erectile dysfunction, unspecified: Secondary | ICD-10-CM | POA: Diagnosis not present

## 2014-04-10 DIAGNOSIS — E291 Testicular hypofunction: Secondary | ICD-10-CM | POA: Diagnosis not present

## 2014-04-10 DIAGNOSIS — N401 Enlarged prostate with lower urinary tract symptoms: Secondary | ICD-10-CM | POA: Diagnosis not present

## 2014-04-10 DIAGNOSIS — N312 Flaccid neuropathic bladder, not elsewhere classified: Secondary | ICD-10-CM | POA: Diagnosis not present

## 2014-04-26 DIAGNOSIS — Z23 Encounter for immunization: Secondary | ICD-10-CM | POA: Diagnosis not present

## 2014-05-09 DIAGNOSIS — M5186 Other intervertebral disc disorders, lumbar region: Secondary | ICD-10-CM | POA: Diagnosis not present

## 2014-05-09 DIAGNOSIS — M5126 Other intervertebral disc displacement, lumbar region: Secondary | ICD-10-CM | POA: Diagnosis not present

## 2014-05-22 DIAGNOSIS — Z0000 Encounter for general adult medical examination without abnormal findings: Secondary | ICD-10-CM | POA: Diagnosis not present

## 2014-05-22 DIAGNOSIS — Z125 Encounter for screening for malignant neoplasm of prostate: Secondary | ICD-10-CM | POA: Diagnosis not present

## 2014-05-22 DIAGNOSIS — I10 Essential (primary) hypertension: Secondary | ICD-10-CM | POA: Diagnosis not present

## 2014-05-31 DIAGNOSIS — Z1389 Encounter for screening for other disorder: Secondary | ICD-10-CM | POA: Diagnosis not present

## 2014-05-31 DIAGNOSIS — I951 Orthostatic hypotension: Secondary | ICD-10-CM | POA: Diagnosis not present

## 2014-05-31 DIAGNOSIS — I10 Essential (primary) hypertension: Secondary | ICD-10-CM | POA: Diagnosis not present

## 2014-05-31 DIAGNOSIS — Z0000 Encounter for general adult medical examination without abnormal findings: Secondary | ICD-10-CM | POA: Diagnosis not present

## 2014-05-31 DIAGNOSIS — E291 Testicular hypofunction: Secondary | ICD-10-CM | POA: Diagnosis not present

## 2014-05-31 DIAGNOSIS — K121 Other forms of stomatitis: Secondary | ICD-10-CM | POA: Diagnosis not present

## 2014-05-31 DIAGNOSIS — N318 Other neuromuscular dysfunction of bladder: Secondary | ICD-10-CM | POA: Diagnosis not present

## 2014-05-31 DIAGNOSIS — M4806 Spinal stenosis, lumbar region: Secondary | ICD-10-CM | POA: Diagnosis not present

## 2014-05-31 DIAGNOSIS — G309 Alzheimer's disease, unspecified: Secondary | ICD-10-CM | POA: Diagnosis not present

## 2014-06-01 DIAGNOSIS — Z1212 Encounter for screening for malignant neoplasm of rectum: Secondary | ICD-10-CM | POA: Diagnosis not present

## 2014-06-05 DIAGNOSIS — D101 Benign neoplasm of tongue: Secondary | ICD-10-CM | POA: Diagnosis not present

## 2014-06-08 DIAGNOSIS — K1379 Other lesions of oral mucosa: Secondary | ICD-10-CM | POA: Diagnosis not present

## 2014-07-03 DIAGNOSIS — K5730 Diverticulosis of large intestine without perforation or abscess without bleeding: Secondary | ICD-10-CM | POA: Diagnosis not present

## 2014-07-03 DIAGNOSIS — D125 Benign neoplasm of sigmoid colon: Secondary | ICD-10-CM | POA: Diagnosis not present

## 2014-07-03 DIAGNOSIS — Z1211 Encounter for screening for malignant neoplasm of colon: Secondary | ICD-10-CM | POA: Diagnosis not present

## 2014-07-03 DIAGNOSIS — D126 Benign neoplasm of colon, unspecified: Secondary | ICD-10-CM | POA: Diagnosis not present

## 2014-08-14 DIAGNOSIS — M5136 Other intervertebral disc degeneration, lumbar region: Secondary | ICD-10-CM | POA: Diagnosis not present

## 2014-10-17 DIAGNOSIS — R351 Nocturia: Secondary | ICD-10-CM | POA: Diagnosis not present

## 2014-10-17 DIAGNOSIS — N312 Flaccid neuropathic bladder, not elsewhere classified: Secondary | ICD-10-CM | POA: Diagnosis not present

## 2014-10-17 DIAGNOSIS — N401 Enlarged prostate with lower urinary tract symptoms: Secondary | ICD-10-CM | POA: Diagnosis not present

## 2014-11-28 DIAGNOSIS — M5136 Other intervertebral disc degeneration, lumbar region: Secondary | ICD-10-CM | POA: Diagnosis not present

## 2014-11-29 DIAGNOSIS — M4806 Spinal stenosis, lumbar region: Secondary | ICD-10-CM | POA: Diagnosis not present

## 2014-11-29 DIAGNOSIS — N400 Enlarged prostate without lower urinary tract symptoms: Secondary | ICD-10-CM | POA: Diagnosis not present

## 2014-11-29 DIAGNOSIS — I10 Essential (primary) hypertension: Secondary | ICD-10-CM | POA: Diagnosis not present

## 2014-11-29 DIAGNOSIS — Z6828 Body mass index (BMI) 28.0-28.9, adult: Secondary | ICD-10-CM | POA: Diagnosis not present

## 2014-11-29 DIAGNOSIS — Z23 Encounter for immunization: Secondary | ICD-10-CM | POA: Diagnosis not present

## 2014-11-29 DIAGNOSIS — G309 Alzheimer's disease, unspecified: Secondary | ICD-10-CM | POA: Diagnosis not present

## 2015-01-12 DIAGNOSIS — M5136 Other intervertebral disc degeneration, lumbar region: Secondary | ICD-10-CM | POA: Diagnosis not present

## 2015-04-03 DIAGNOSIS — M5136 Other intervertebral disc degeneration, lumbar region: Secondary | ICD-10-CM | POA: Diagnosis not present

## 2015-04-12 DIAGNOSIS — Z23 Encounter for immunization: Secondary | ICD-10-CM | POA: Diagnosis not present

## 2015-04-12 DIAGNOSIS — G309 Alzheimer's disease, unspecified: Secondary | ICD-10-CM | POA: Diagnosis not present

## 2015-04-12 DIAGNOSIS — Z6829 Body mass index (BMI) 29.0-29.9, adult: Secondary | ICD-10-CM | POA: Diagnosis not present

## 2015-04-12 DIAGNOSIS — I10 Essential (primary) hypertension: Secondary | ICD-10-CM | POA: Diagnosis not present

## 2015-05-27 DIAGNOSIS — R8290 Unspecified abnormal findings in urine: Secondary | ICD-10-CM | POA: Diagnosis not present

## 2015-05-27 DIAGNOSIS — Z79899 Other long term (current) drug therapy: Secondary | ICD-10-CM | POA: Diagnosis not present

## 2015-05-27 DIAGNOSIS — I10 Essential (primary) hypertension: Secondary | ICD-10-CM | POA: Diagnosis not present

## 2015-06-07 DIAGNOSIS — Z0000 Encounter for general adult medical examination without abnormal findings: Secondary | ICD-10-CM | POA: Diagnosis not present

## 2015-06-07 DIAGNOSIS — G629 Polyneuropathy, unspecified: Secondary | ICD-10-CM | POA: Diagnosis not present

## 2015-06-07 DIAGNOSIS — M4806 Spinal stenosis, lumbar region: Secondary | ICD-10-CM | POA: Diagnosis not present

## 2015-06-07 DIAGNOSIS — Z1389 Encounter for screening for other disorder: Secondary | ICD-10-CM | POA: Diagnosis not present

## 2015-06-07 DIAGNOSIS — E291 Testicular hypofunction: Secondary | ICD-10-CM | POA: Diagnosis not present

## 2015-06-07 DIAGNOSIS — N318 Other neuromuscular dysfunction of bladder: Secondary | ICD-10-CM | POA: Diagnosis not present

## 2015-06-07 DIAGNOSIS — M545 Low back pain: Secondary | ICD-10-CM | POA: Diagnosis not present

## 2015-06-07 DIAGNOSIS — N400 Enlarged prostate without lower urinary tract symptoms: Secondary | ICD-10-CM | POA: Diagnosis not present

## 2015-06-07 DIAGNOSIS — G309 Alzheimer's disease, unspecified: Secondary | ICD-10-CM | POA: Diagnosis not present

## 2015-06-07 DIAGNOSIS — I10 Essential (primary) hypertension: Secondary | ICD-10-CM | POA: Diagnosis not present

## 2015-06-07 DIAGNOSIS — Z6829 Body mass index (BMI) 29.0-29.9, adult: Secondary | ICD-10-CM | POA: Diagnosis not present

## 2015-06-28 DIAGNOSIS — L821 Other seborrheic keratosis: Secondary | ICD-10-CM | POA: Diagnosis not present

## 2015-06-28 DIAGNOSIS — L570 Actinic keratosis: Secondary | ICD-10-CM | POA: Diagnosis not present

## 2015-06-28 DIAGNOSIS — D1801 Hemangioma of skin and subcutaneous tissue: Secondary | ICD-10-CM | POA: Diagnosis not present

## 2015-07-10 DIAGNOSIS — M5136 Other intervertebral disc degeneration, lumbar region: Secondary | ICD-10-CM | POA: Diagnosis not present

## 2015-10-15 DIAGNOSIS — M5136 Other intervertebral disc degeneration, lumbar region: Secondary | ICD-10-CM | POA: Diagnosis not present

## 2015-11-04 DIAGNOSIS — L602 Onychogryphosis: Secondary | ICD-10-CM | POA: Diagnosis not present

## 2015-11-04 DIAGNOSIS — M2012 Hallux valgus (acquired), left foot: Secondary | ICD-10-CM | POA: Diagnosis not present

## 2015-11-04 DIAGNOSIS — M2011 Hallux valgus (acquired), right foot: Secondary | ICD-10-CM | POA: Diagnosis not present

## 2015-11-06 DIAGNOSIS — B351 Tinea unguium: Secondary | ICD-10-CM | POA: Diagnosis not present

## 2015-11-06 DIAGNOSIS — L602 Onychogryphosis: Secondary | ICD-10-CM | POA: Diagnosis not present

## 2015-11-25 DIAGNOSIS — L602 Onychogryphosis: Secondary | ICD-10-CM | POA: Diagnosis not present

## 2015-11-27 DIAGNOSIS — Z79899 Other long term (current) drug therapy: Secondary | ICD-10-CM | POA: Diagnosis not present

## 2015-11-27 DIAGNOSIS — B351 Tinea unguium: Secondary | ICD-10-CM | POA: Diagnosis not present

## 2015-12-06 DIAGNOSIS — N400 Enlarged prostate without lower urinary tract symptoms: Secondary | ICD-10-CM | POA: Diagnosis not present

## 2015-12-06 DIAGNOSIS — I10 Essential (primary) hypertension: Secondary | ICD-10-CM | POA: Diagnosis not present

## 2015-12-06 DIAGNOSIS — F329 Major depressive disorder, single episode, unspecified: Secondary | ICD-10-CM | POA: Diagnosis not present

## 2015-12-06 DIAGNOSIS — M4806 Spinal stenosis, lumbar region: Secondary | ICD-10-CM | POA: Diagnosis not present

## 2015-12-06 DIAGNOSIS — Z6830 Body mass index (BMI) 30.0-30.9, adult: Secondary | ICD-10-CM | POA: Diagnosis not present

## 2015-12-06 DIAGNOSIS — G309 Alzheimer's disease, unspecified: Secondary | ICD-10-CM | POA: Diagnosis not present

## 2016-01-24 DIAGNOSIS — B351 Tinea unguium: Secondary | ICD-10-CM | POA: Diagnosis not present

## 2016-02-01 DIAGNOSIS — M5136 Other intervertebral disc degeneration, lumbar region: Secondary | ICD-10-CM | POA: Diagnosis not present

## 2016-02-11 DIAGNOSIS — H6123 Impacted cerumen, bilateral: Secondary | ICD-10-CM | POA: Diagnosis not present

## 2016-02-11 DIAGNOSIS — H9193 Unspecified hearing loss, bilateral: Secondary | ICD-10-CM | POA: Diagnosis not present

## 2016-02-11 DIAGNOSIS — Z6831 Body mass index (BMI) 31.0-31.9, adult: Secondary | ICD-10-CM | POA: Diagnosis not present

## 2016-04-16 DIAGNOSIS — Z6831 Body mass index (BMI) 31.0-31.9, adult: Secondary | ICD-10-CM | POA: Diagnosis not present

## 2016-04-16 DIAGNOSIS — R05 Cough: Secondary | ICD-10-CM | POA: Diagnosis not present

## 2016-04-16 DIAGNOSIS — I10 Essential (primary) hypertension: Secondary | ICD-10-CM | POA: Diagnosis not present

## 2016-04-16 DIAGNOSIS — R233 Spontaneous ecchymoses: Secondary | ICD-10-CM | POA: Diagnosis not present

## 2016-04-27 ENCOUNTER — Other Ambulatory Visit: Payer: Self-pay | Admitting: Internal Medicine

## 2016-04-27 ENCOUNTER — Ambulatory Visit
Admission: RE | Admit: 2016-04-27 | Discharge: 2016-04-27 | Disposition: A | Discharge status: Home / Self Care | Payer: Medicare Other | Source: Ambulatory Visit | Attending: Internal Medicine | Admitting: Internal Medicine

## 2016-04-27 DIAGNOSIS — R233 Spontaneous ecchymoses: Secondary | ICD-10-CM

## 2016-04-27 DIAGNOSIS — Z79899 Other long term (current) drug therapy: Secondary | ICD-10-CM | POA: Diagnosis not present

## 2016-04-27 DIAGNOSIS — Z6831 Body mass index (BMI) 31.0-31.9, adult: Secondary | ICD-10-CM | POA: Diagnosis not present

## 2016-04-27 DIAGNOSIS — R05 Cough: Secondary | ICD-10-CM | POA: Diagnosis not present

## 2016-04-27 DIAGNOSIS — S301XXA Contusion of abdominal wall, initial encounter: Secondary | ICD-10-CM | POA: Diagnosis not present

## 2016-04-27 MED ORDER — IOPAMIDOL (ISOVUE-300) INJECTION 61%: 100.0000 mL | Freq: Once | INTRAVENOUS | Status: DC | PRN

## 2016-04-29 DIAGNOSIS — I10 Essential (primary) hypertension: Secondary | ICD-10-CM | POA: Diagnosis not present

## 2016-04-29 DIAGNOSIS — R233 Spontaneous ecchymoses: Secondary | ICD-10-CM | POA: Diagnosis not present

## 2016-06-10 DIAGNOSIS — F3289 Other specified depressive episodes: Secondary | ICD-10-CM | POA: Diagnosis not present

## 2016-06-10 DIAGNOSIS — G308 Other Alzheimer's disease: Secondary | ICD-10-CM | POA: Diagnosis not present

## 2016-06-10 DIAGNOSIS — Z23 Encounter for immunization: Secondary | ICD-10-CM | POA: Diagnosis not present

## 2016-06-10 DIAGNOSIS — Z1389 Encounter for screening for other disorder: Secondary | ICD-10-CM | POA: Diagnosis not present

## 2016-06-10 DIAGNOSIS — M545 Low back pain: Secondary | ICD-10-CM | POA: Diagnosis not present

## 2016-06-10 DIAGNOSIS — Z6831 Body mass index (BMI) 31.0-31.9, adult: Secondary | ICD-10-CM | POA: Diagnosis not present

## 2016-06-10 DIAGNOSIS — N400 Enlarged prostate without lower urinary tract symptoms: Secondary | ICD-10-CM | POA: Diagnosis not present

## 2016-06-10 DIAGNOSIS — I10 Essential (primary) hypertension: Secondary | ICD-10-CM | POA: Diagnosis not present

## 2016-06-10 DIAGNOSIS — Z0000 Encounter for general adult medical examination without abnormal findings: Secondary | ICD-10-CM | POA: Diagnosis not present

## 2016-06-10 DIAGNOSIS — N318 Other neuromuscular dysfunction of bladder: Secondary | ICD-10-CM | POA: Diagnosis not present

## 2016-09-16 DIAGNOSIS — M5136 Other intervertebral disc degeneration, lumbar region: Secondary | ICD-10-CM | POA: Diagnosis not present

## 2016-12-25 DIAGNOSIS — Z6832 Body mass index (BMI) 32.0-32.9, adult: Secondary | ICD-10-CM | POA: Diagnosis not present

## 2016-12-25 DIAGNOSIS — H9192 Unspecified hearing loss, left ear: Secondary | ICD-10-CM | POA: Diagnosis not present

## 2016-12-25 DIAGNOSIS — H6122 Impacted cerumen, left ear: Secondary | ICD-10-CM | POA: Diagnosis not present

## 2017-05-18 DIAGNOSIS — M5136 Other intervertebral disc degeneration, lumbar region: Secondary | ICD-10-CM | POA: Diagnosis not present

## 2017-06-08 DIAGNOSIS — R82998 Other abnormal findings in urine: Secondary | ICD-10-CM | POA: Diagnosis not present

## 2017-06-08 DIAGNOSIS — I10 Essential (primary) hypertension: Secondary | ICD-10-CM | POA: Diagnosis not present

## 2018-01-18 DIAGNOSIS — H6123 Impacted cerumen, bilateral: Secondary | ICD-10-CM | POA: Diagnosis not present

## 2018-02-24 DIAGNOSIS — L821 Other seborrheic keratosis: Secondary | ICD-10-CM | POA: Diagnosis not present

## 2018-02-24 DIAGNOSIS — C4441 Basal cell carcinoma of skin of scalp and neck: Secondary | ICD-10-CM | POA: Diagnosis not present

## 2018-02-24 DIAGNOSIS — L859 Epidermal thickening, unspecified: Secondary | ICD-10-CM | POA: Diagnosis not present

## 2018-02-24 DIAGNOSIS — L570 Actinic keratosis: Secondary | ICD-10-CM | POA: Diagnosis not present

## 2018-02-24 DIAGNOSIS — D1801 Hemangioma of skin and subcutaneous tissue: Secondary | ICD-10-CM | POA: Diagnosis not present

## 2018-02-24 DIAGNOSIS — D235 Other benign neoplasm of skin of trunk: Secondary | ICD-10-CM | POA: Diagnosis not present

## 2018-02-24 DIAGNOSIS — D485 Neoplasm of uncertain behavior of skin: Secondary | ICD-10-CM | POA: Diagnosis not present

## 2018-04-04 DIAGNOSIS — Z85828 Personal history of other malignant neoplasm of skin: Secondary | ICD-10-CM | POA: Diagnosis not present

## 2018-04-04 DIAGNOSIS — C44319 Basal cell carcinoma of skin of other parts of face: Secondary | ICD-10-CM | POA: Diagnosis not present

## 2018-04-11 DIAGNOSIS — L570 Actinic keratosis: Secondary | ICD-10-CM | POA: Diagnosis not present

## 2018-06-10 DIAGNOSIS — I10 Essential (primary) hypertension: Secondary | ICD-10-CM | POA: Diagnosis not present

## 2018-06-10 DIAGNOSIS — R82998 Other abnormal findings in urine: Secondary | ICD-10-CM | POA: Diagnosis not present

## 2018-06-10 DIAGNOSIS — R7301 Impaired fasting glucose: Secondary | ICD-10-CM | POA: Diagnosis not present

## 2018-06-29 ENCOUNTER — Emergency Department (HOSPITAL_COMMUNITY)
Admission: EM | Admit: 2018-06-29 | Discharge: 2018-06-30 | Disposition: A | Discharge status: Home / Self Care | Payer: Medicare Other | Source: Home / Self Care | Attending: Emergency Medicine | Admitting: Emergency Medicine

## 2018-06-29 ENCOUNTER — Encounter (HOSPITAL_COMMUNITY): Payer: Self-pay | Admitting: *Deleted

## 2018-06-29 ENCOUNTER — Emergency Department (HOSPITAL_COMMUNITY): Payer: Medicare Other

## 2018-06-29 DIAGNOSIS — K5730 Diverticulosis of large intestine without perforation or abscess without bleeding: Secondary | ICD-10-CM | POA: Diagnosis not present

## 2018-06-29 DIAGNOSIS — Z79899 Other long term (current) drug therapy: Secondary | ICD-10-CM

## 2018-06-29 DIAGNOSIS — M545 Low back pain, unspecified: Secondary | ICD-10-CM

## 2018-06-29 DIAGNOSIS — F0390 Unspecified dementia without behavioral disturbance: Secondary | ICD-10-CM | POA: Insufficient documentation

## 2018-06-29 DIAGNOSIS — S299XXA Unspecified injury of thorax, initial encounter: Secondary | ICD-10-CM | POA: Diagnosis not present

## 2018-06-29 DIAGNOSIS — I10 Essential (primary) hypertension: Secondary | ICD-10-CM | POA: Diagnosis not present

## 2018-06-29 DIAGNOSIS — N3001 Acute cystitis with hematuria: Secondary | ICD-10-CM | POA: Diagnosis not present

## 2018-06-29 DIAGNOSIS — S199XXA Unspecified injury of neck, initial encounter: Secondary | ICD-10-CM | POA: Diagnosis not present

## 2018-06-29 DIAGNOSIS — G9341 Metabolic encephalopathy: Secondary | ICD-10-CM | POA: Diagnosis not present

## 2018-06-29 DIAGNOSIS — S0990XA Unspecified injury of head, initial encounter: Secondary | ICD-10-CM | POA: Diagnosis not present

## 2018-06-29 DIAGNOSIS — R531 Weakness: Secondary | ICD-10-CM | POA: Diagnosis not present

## 2018-06-29 DIAGNOSIS — D696 Thrombocytopenia, unspecified: Secondary | ICD-10-CM | POA: Diagnosis not present

## 2018-06-29 DIAGNOSIS — R410 Disorientation, unspecified: Secondary | ICD-10-CM | POA: Diagnosis not present

## 2018-06-29 DIAGNOSIS — Z87891 Personal history of nicotine dependence: Secondary | ICD-10-CM | POA: Insufficient documentation

## 2018-06-29 DIAGNOSIS — R55 Syncope and collapse: Secondary | ICD-10-CM | POA: Diagnosis not present

## 2018-06-29 DIAGNOSIS — E876 Hypokalemia: Secondary | ICD-10-CM | POA: Diagnosis not present

## 2018-06-29 DIAGNOSIS — R4182 Altered mental status, unspecified: Secondary | ICD-10-CM | POA: Diagnosis not present

## 2018-06-29 DIAGNOSIS — S0091XA Abrasion of unspecified part of head, initial encounter: Secondary | ICD-10-CM | POA: Diagnosis not present

## 2018-06-29 LAB — CBC
HCT: 45.2 % (ref 39.0–52.0)
HEMOGLOBIN: 14.6 g/dL (ref 13.0–17.0)
MCH: 33.1 pg (ref 26.0–34.0)
MCHC: 32.3 g/dL (ref 30.0–36.0)
MCV: 102.5 fL — ABNORMAL HIGH (ref 80.0–100.0)
NRBC: 0 % (ref 0.0–0.2)
PLATELETS: 132 10*3/uL — AB (ref 150–400)
RBC: 4.41 MIL/uL (ref 4.22–5.81)
RDW: 12.4 % (ref 11.5–15.5)
WBC: 10.4 10*3/uL (ref 4.0–10.5)

## 2018-06-29 LAB — COMPREHENSIVE METABOLIC PANEL
ALK PHOS: 43 U/L (ref 38–126)
ALT: 37 U/L (ref 0–44)
ANION GAP: 11 (ref 5–15)
AST: 30 U/L (ref 15–41)
Albumin: 4.3 g/dL (ref 3.5–5.0)
BILIRUBIN TOTAL: 0.9 mg/dL (ref 0.3–1.2)
BUN: 22 mg/dL (ref 8–23)
CO2: 23 mmol/L (ref 22–32)
CREATININE: 1.1 mg/dL (ref 0.61–1.24)
Calcium: 9.7 mg/dL (ref 8.9–10.3)
Chloride: 103 mmol/L (ref 98–111)
GFR calc Af Amer: >60 mL/min (ref >60)
GFR calc non Af Amer: >60 mL/min (ref >60)
Glucose, Bld: 201 mg/dL — ABNORMAL HIGH (ref 70–99)
POTASSIUM: 4.2 mmol/L (ref 3.5–5.1)
Sodium: 137 mmol/L (ref 135–145)
TOTAL PROTEIN: 6.8 g/dL (ref 6.5–8.1)

## 2018-06-29 LAB — URINALYSIS, ROUTINE W REFLEX MICROSCOPIC
BACTERIA UA: NONE SEEN
Bilirubin Urine: NEGATIVE
GLUCOSE, UA: NEGATIVE mg/dL
Ketones, ur: NEGATIVE mg/dL
LEUKOCYTES UA: NEGATIVE
Nitrite: NEGATIVE
Protein, ur: NEGATIVE mg/dL
Specific Gravity, Urine: 1.033 — ABNORMAL HIGH (ref 1.005–1.030)
pH: 6 (ref 5.0–8.0)

## 2018-06-29 MED ORDER — IOPAMIDOL (ISOVUE-370) INJECTION 76%
INTRAVENOUS | Status: AC
  Administered 2018-06-29: 100 mL
  Filled 2018-06-29: qty 100

## 2018-06-29 MED ORDER — CYCLOBENZAPRINE HCL 5 MG PO TABS: 5.0000 mg | ORAL_TABLET | Freq: Two times a day (BID) | ORAL | 0 refills | Status: DC | PRN

## 2018-06-29 MED ORDER — ACETAMINOPHEN 500 MG PO TABS
1000.0000 mg | ORAL_TABLET | Freq: Once | ORAL | Status: AC
  Administered 2018-06-29: 1000 mg via ORAL
  Filled 2018-06-29: qty 2

## 2018-06-29 NOTE — ED Notes (Signed)
I have attempted to contact the patients wife at the number listed, number is not available, the patient is resting quietly in the hall at this time.

## 2018-06-29 NOTE — ED Provider Notes (Signed)
[facility] Provider Note   CSN: [id number] Arrival date & time: 06/29/18  1448     History   Chief Complaint Chief Complaint  Patient presents with  . Back Pain  . Altered Mental Status    HPI Ricky Black is a 77 y.o. male.  HPI   Hx of back pain but more severe today Has been more confused today, babbling, saying statements which are nonsense. Took hydrocodone this AM, and at 1PM, usually takes it once or twice a day. No medication changes.  No pain with urination.  History of dementia.   Patient denies pain but does state back hurts. Reports it began a few weeks ago.  Reports lower back pain.  Wife reports ab domen looks distended.  Patient denies chest pain, abdominal pain.  No increased swelling in legs.  Eating ok, no nausea, no vomiting, no black or bloody stools or diarrhea.  Reports righht lower back pain which is severe. No difficulty urinating. No fevers.  Past Medical History:  Diagnosis Date  . Abnormal EKG    Prior tracing  with reading can't rule out old septal MI  . Bradycardia   . Dementia ([place])   . Dizziness   . Hypogonadism male   . Neuropathy    (R) Dorsal foot  . OAB (overactive bladder)   . Olecranon bursitis   . Orthostatic hypotension   . Spinal arthritis    ESI    Patient Active Problem List   Diagnosis Date Noted  . Bradycardia   . Dizziness   . Orthostatic hypotension   . Spinal arthritis   . Neuropathy   . Hypogonadism male   . Dementia ([place])   . Abnormal EKG   . HYPERTENSION 05/10/2007  . ARTHRITIS 05/10/2007    Past Surgical History:  Procedure Laterality Date  . COLONOSCOPY  04/15/2012  . LUMBAR LAMINECTOMY    . TOTAL HIP ARTHROPLASTY     Right  . UMBILICAL HERNIA REPAIR          Home Medications    Prior to Admission medications   Medication Sig Start Date End Date Taking? Authorizing Provider  bethanechol (URECHOLINE) 25 MG tablet Take 25 mg by mouth daily.   Yes  [provider]  celecoxib (CELEBREX) 200 MG capsule Take 200 mg by mouth daily.   Yes [provider]  DONEPEZIL HCL PO Take 1 tablet by mouth at bedtime.   Yes [provider]  finasteride (PROSCAR) 5 MG tablet Take 5 mg by mouth daily.   Yes [provider]  HYDROCHLOROTHIAZIDE PO Take 1 tablet by mouth daily.   Yes [provider]  HYDROcodone-acetaminophen (NORCO) 7.5-325 MG per tablet Take 1 tablet by mouth every 6 (six) hours as needed for pain.   Yes [provider]  Memantine HCl (NAMENDA PO) Take 1 tablet by mouth 2 (two) times daily.   Yes [provider]  Pregabalin (LYRICA PO) Take 1 capsule by mouth 2 (two) times daily.   Yes [provider]  Sertraline HCl (ZOLOFT PO) Take 1 tablet by mouth daily.   Yes [provider]  tamsulosin (FLOMAX) 0.4 MG CAPS capsule Take 0.8 mg by mouth at bedtime.   Yes [provider]  VALSARTAN PO Take 1 tablet by mouth daily.   Yes [provider]  cyclobenzaprine (FLEXERIL) 5 MG tablet Take 1 tablet (5 mg total) by mouth 2 (two) times daily as needed for muscle spasms.  06/29/18   Gareth Morgan, MD    Family History History reviewed. No pertinent family history.  Social History Social History   Tobacco Use  . Smoking status: Former Smoker  Substance Use Topics  . Alcohol use: No  . Drug use: Not on file     Allergies   Patient has no known allergies.   Review of Systems Review of Systems  Unable to perform ROS: Dementia  Constitutional: Negative for fever.  Respiratory: Negative for cough and shortness of breath.   Cardiovascular: Negative for chest pain.  Gastrointestinal: Negative for abdominal pain, blood in stool, diarrhea, nausea and vomiting.  Genitourinary: Positive for decreased urine volume. Negative for dysuria.  Musculoskeletal: Positive for back pain.  Neurological: Negative for weakness, numbness and headaches.      Physical Exam Updated Vital Signs BP (!) 179/85 (BP Location: Left Arm)   Pulse 84   Temp 99.8 F (37.7 C) (Oral)   Resp 18   SpO2 98%   Physical Exam  Constitutional: He is oriented to person, place, and time. He appears well-developed and well-nourished. No distress.  HENT:  Head: Normocephalic and atraumatic.  Eyes: Conjunctivae and EOM are normal.  Neck: Normal range of motion.  Cardiovascular: Normal rate, regular rhythm, normal heart sounds and intact distal pulses. Exam reveals no gallop and no friction rub.  No murmur heard. Pulmonary/Chest: Effort normal and breath sounds normal. No respiratory distress. He has no wheezes. He has no rales.  Abdominal: Soft. He exhibits distension. There is no tenderness. There is no guarding.  Musculoskeletal: He exhibits no edema.       Lumbar back: He exhibits tenderness (right sided). He exhibits no bony tenderness.  Neurological: He is alert and oriented to person, place, and time.  Skin: Skin is warm and dry. He is not diaphoretic.  Nursing note and vitals reviewed.    ED Treatments / Results  Labs (all labs ordered are listed, but only abnormal results are displayed) Labs Reviewed  COMPREHENSIVE METABOLIC PANEL - Abnormal; Notable for the following components:      Result Value   Glucose, Bld 201 (*)    All other components within normal limits  CBC - Abnormal; Notable for the following components:   MCV 102.5 (*)    Platelets 132 (*)    All other components within normal limits  URINALYSIS, ROUTINE W REFLEX MICROSCOPIC - Abnormal; Notable for the following components:   APPearance HAZY (*)    Specific Gravity, Urine 1.033 (*)    Hgb urine dipstick LARGE (*)    All other components within normal limits  URINE CULTURE    EKG EKG Interpretation  Date/Time:  Wednesday June 29 2018 16:17:02 EST Ventricular Rate:  76 PR Interval:    QRS Duration: 121 QT Interval:  395 QTC Calculation: 445 R  Axis:   -47 Text Interpretation:  Sinus rhythm Short PR interval Nonspecific IVCD with LAD Left ventricular hypertrophy Anterior Q waves, possibly due to LVH Since prior ECG in 2007, nonspecific IVCD present Confirmed by Gareth Morgan [id number]) on 06/29/2018 6:54:33 PM   Radiology Ct Angio Abd/pel W And/or Wo Contrast  Result Date: 06/29/2018 CLINICAL DATA:  Low back pain beginning this morning. Evaluate for abdominal aortic aneurysm. EXAM: CT ANGIOGRAPHY ABDOMEN AND PELVIS WITH CONTRAST AND WITHOUT CONTRAST TECHNIQUE: Multidetector CT imaging of the abdomen and pelvis was performed using the standard protocol during bolus administration of intravenous contrast. Multiplanar reconstructed images and MIPs were obtained and reviewed to evaluate  the vascular anatomy. CONTRAST:  164mL ISOVUE-370 IOPAMIDOL (ISOVUE-370) INJECTION 76% COMPARISON:  CT abdomen and pelvis - 04/27/2016; 03/01/2013; 07/12/2003 FINDINGS: VASCULAR Aorta: Moderate amount of predominantly calcified atherosclerotic plaque within a normal caliber abdominal aorta, not resulting in hemodynamically significant stenosis. No abdominal aortic dissection or periaortic stranding. Celiac: There is a minimal amount eccentric calcified plaque about the origin of the celiac artery, not resulting in hemodynamically significant stenosis. Conventional branching pattern. SMA: There is a minimal amount of mixed calcified and noncalcified atherosclerotic plaque involving the origin and proximal aspect of the SMA, not resulting in hemodynamically significant stenosis. Conventional branching pattern. The distal tributaries of the SMA appear widely patent without discrete intraluminal filling defect suggest distal embolism. Renals: Solitary bilaterally; there is a moderate amount of eccentric calcified atherosclerotic plaque involving the origin of the bilateral renal arteries which approaches 50% luminal narrowing bilaterally though is without associated  asymmetric renal atrophy or perinephric stranding. IMA: Widely patent. Inflow: There is a minimal amount of crescentic calcified atherosclerotic plaque within the bilateral normal caliber common iliac arteries, not resulting in hemodynamically significant stenosis. The bilateral external iliac arteries are tortuous though widely patent of normal caliber. The bilateral internal iliac arteries are mildly disease though patent and of normal caliber. Proximal Outflow: There is a minimal amount of eccentric probably calcified atherosclerotic plaque involving the bilateral common femoral arteries, not resulting in hemodynamically significant stenosis. The bilateral superficial and deep femoral arteries appear widely patent throughout their imaged courses. Veins: The IVC and pelvic venous system appear widely patent on this arterial phase examination. Review of the MIP images confirms the above findings. _________________________________________________________ NON-VASCULAR Lower chest: Limited visualization of the lower thorax demonstrates minimal subsegmental atelectasis within the image right lower lobe. No focal airspace opacities. No pleural effusion. Hepatobiliary: There is diffuse decreased attenuation of the hepatic parenchyma on this postcontrast examination suggestive of hepatic steatosis. No discrete hyperenhancing hepatic lesions. Normal appearance of the gallbladder given degree distention. No radiopaque gallstones. No ascites. Pancreas: Normal appearance of the pancreas Spleen: Normal appearance of the spleen Adrenals/Urinary Tract: There is symmetric enhancement of the bilateral kidneys. Note is made of an approximately 2.4 cm hypoattenuating (11 Hounsfield unit) left-sided renal cyst (image 37, series 5 as well as an approximately 3.3 cm exophytic hypoattenuating right-sided renal cyst (image 24). There is geographic cortical atrophy involving the superior pole of the right kidney (image coronal image 134,  series 8). No discrete worrisome renal lesions. No definite evidence of nephrolithiasis on this postcontrast examination. There is a very minimal amount of likely age and body habitus related perinephric stranding. No urine obstruction. There is mild thickening of the bilateral adrenal glands without discrete nodule. Normal appearance of the urinary bladder given degree of distention. Stomach/Bowel: Scattered colonic diverticulosis primarily involving the distal aspect of the descending and sigmoid colon, without evidence of superimposed acute diverticulitis. Normal appearance of the terminal ileum and appendix. Moderate size hiatal hernia. No pneumoperitoneum, pneumatosis or portal venous gas. Lymphatic: No bulky retroperitoneal, mesenteric, pelvic or inguinal lymphadenopathy. Reproductive: Dystrophic calcifications within normal sized prostate gland, though note, evaluation degraded secondary streak artifact from the patient's right total hip prosthesis. Other: Minimal amount of subcutaneous edema about the midline of the low back. Musculoskeletal: No definite acute or aggressive osseous abnormalities. Post right total hip replacement without definitive evidence of hardware failure loosening though note the caudal aspect of the right femoral stem component was not imaged. There are multiple peripherally calcified ossicles superior to the right greater  trochanter likely the sequela of prior trauma and/or iatrogenic. Severe degenerative change of the contralateral left hip with joint space loss, subchondral sclerosis and osteophytosis. No evidence avascular necrosis. Moderate severe degenerative change throughout the lumbar spine. Stigmata of DISH throughout the thoracic and lumbar spine. Post L5 laminectomy with linear calcifications posterior to the sacrum with associated linear subcutaneous scarring. IMPRESSION: VASCULAR 1. No definite explanation for patient's acute back pain. Specifically, no evidence of  abdominal aortic aneurysm or dissection. 2.  Aortic Atherosclerosis (ICD10-I70.0). 3. Suspected hemodynamically significant stenosis ease involving the bilateral renal arteries without associated asymmetric renal atrophy or delayed renal enhancement. NON-VASCULAR 1. Colonic diverticulosis without evidence of superimposed acute diverticulitis. 2. Suspected hepatic steatosis. Correlation with LFTs could be performed as clinically indicated. 3. Moderate size hiatal hernia. Electronically Signed   By: Sandi Mariscal M.D.   On: 06/29/2018 18:53    Procedures Procedures (including critical care time)  Medications Ordered in ED Medications  iopamidol (ISOVUE-370) 76 % injection (100 mLs  Contrast Given 06/29/18 1815)  acetaminophen (TYLENOL) tablet 1,000 mg (1,000 mg Oral Given 06/29/18 2256)     Initial Impression / Assessment and Plan / ED Course  I have reviewed the triage vital signs and the nursing notes.  Pertinent labs & imaging results that were available during my care of the patient were reviewed by me and considered in my medical decision making (see chart for details).     77 year old male with a history of dementia, spinal arthritis, hypertension, who presents with concern for increasing chronic back pain and confusion.  EKG without acute changes, no chest pain to suggest ACS or PE.  No headache, no head trauma, no focal neurologic symptoms or findings on exam, and of low suspicion for CVA or intracranial etiology of symptoms.  Denies cough, shortness of breath, and have low suspicion for pneumonia.  Urinalysis shows no sign of urinary tract infection.  White no abdominal distention, given flank pain, CT angios abdomen pelvis was done which showed no evidence of aortic aneurysm, no dissection, no sign of nephrolithiasis, or other intra-abdominal infection.  He has no red flags of back pain, no signs of cauda equina, normal strength on exam and is able to walk independently.  His vital signs  are stable.  Suspect waxing and waning delirium secondary to increase in back pain from exacerbation of chronic back pain or muscular strain, and/or possible change related to medications. Recommend PCP follow up, close return precautions.  Patient will be discharged, however wife who is caretaker returned home to walk the dog and has not returned, and we do not have number to contact her. Nursing is contacting social work.   Final Clinical Impressions(s) / ED Diagnoses   Final diagnoses:  Acute right-sided low back pain without sciatica    ED Discharge Orders         Ordered    cyclobenzaprine (FLEXERIL) 5 MG tablet  2 times daily PRN     06/29/18 2043           Gareth Morgan, MD 06/30/18 0013

## 2018-06-29 NOTE — ED Triage Notes (Signed)
Pt in with family stating that patient was c/o lower back pain this morning, pt has dementia and is normally confused, but has seemed more confused this afternoon, pt has history of back pain in the past, pt reports he is having back pain still but is unable to explain the location or describe, family denies any changes in his urinary patterns, no fever at home, no indication of infection that they have noticed

## 2018-06-29 NOTE — ED Notes (Signed)
Assisted patient to use urinal. Attempted again to call wife, no voicemail not set up. Charge nurse made aware. Pt is confused, trying to get out of bed frequently. In plain sight of nurses station for safety.

## 2018-06-29 NOTE — Discharge Instructions (Signed)
Your back pain is likely muscular or an exacerbation of your chronic back pain. Sometimes pain can cause increased confusion, and sometimes pain medication can also cause this. You do not have symptoms to suggest pneumonia, no signs of UTI, no signs of abdominal infection or aneurysm.  I recommend close follow up with your doctor.

## 2018-06-29 NOTE — ED Notes (Addendum)
Pt has hx of dementia, pt. Has gotten up from bed numerous times, to urinate, remove leads, BP cuff, pulse ox, etc. Moved pt. To hallway to promote patient safety.  Family is coming to get patient when he is discharged. Pt. Being monitored.

## 2018-06-29 NOTE — ED Notes (Signed)
Attempted to call pt's family multiple times with no answer. Voicemail box is not set up so unable to leave message for family.

## 2018-06-30 ENCOUNTER — Encounter (HOSPITAL_COMMUNITY): Payer: Self-pay

## 2018-06-30 ENCOUNTER — Emergency Department (HOSPITAL_COMMUNITY): Payer: Medicare Other

## 2018-06-30 ENCOUNTER — Observation Stay (HOSPITAL_BASED_OUTPATIENT_CLINIC_OR_DEPARTMENT_OTHER)
Admission: EM | Admit: 2018-06-30 | Discharge: 2018-07-01 | Disposition: A | Discharge status: Home / Self Care | Payer: Medicare Other | Source: Home / Self Care | Attending: Emergency Medicine | Admitting: Emergency Medicine

## 2018-06-30 DIAGNOSIS — Z9181 History of falling: Secondary | ICD-10-CM

## 2018-06-30 DIAGNOSIS — Z79891 Long term (current) use of opiate analgesic: Secondary | ICD-10-CM | POA: Insufficient documentation

## 2018-06-30 DIAGNOSIS — N3281 Overactive bladder: Secondary | ICD-10-CM | POA: Insufficient documentation

## 2018-06-30 DIAGNOSIS — G92 Toxic encephalopathy: Secondary | ICD-10-CM

## 2018-06-30 DIAGNOSIS — Z96641 Presence of right artificial hip joint: Secondary | ICD-10-CM | POA: Insufficient documentation

## 2018-06-30 DIAGNOSIS — M47816 Spondylosis without myelopathy or radiculopathy, lumbar region: Secondary | ICD-10-CM

## 2018-06-30 DIAGNOSIS — R296 Repeated falls: Secondary | ICD-10-CM | POA: Insufficient documentation

## 2018-06-30 DIAGNOSIS — N390 Urinary tract infection, site not specified: Secondary | ICD-10-CM

## 2018-06-30 DIAGNOSIS — R531 Weakness: Secondary | ICD-10-CM

## 2018-06-30 DIAGNOSIS — M549 Dorsalgia, unspecified: Secondary | ICD-10-CM | POA: Diagnosis present

## 2018-06-30 DIAGNOSIS — F0390 Unspecified dementia without behavioral disturbance: Secondary | ICD-10-CM | POA: Insufficient documentation

## 2018-06-30 DIAGNOSIS — S0091XA Abrasion of unspecified part of head, initial encounter: Secondary | ICD-10-CM | POA: Diagnosis not present

## 2018-06-30 DIAGNOSIS — S0990XA Unspecified injury of head, initial encounter: Secondary | ICD-10-CM | POA: Diagnosis not present

## 2018-06-30 DIAGNOSIS — R3129 Other microscopic hematuria: Secondary | ICD-10-CM | POA: Insufficient documentation

## 2018-06-30 DIAGNOSIS — R410 Disorientation, unspecified: Secondary | ICD-10-CM | POA: Diagnosis not present

## 2018-06-30 DIAGNOSIS — R4182 Altered mental status, unspecified: Secondary | ICD-10-CM

## 2018-06-30 DIAGNOSIS — Z79899 Other long term (current) drug therapy: Secondary | ICD-10-CM | POA: Insufficient documentation

## 2018-06-30 DIAGNOSIS — I10 Essential (primary) hypertension: Secondary | ICD-10-CM | POA: Diagnosis present

## 2018-06-30 DIAGNOSIS — Z87891 Personal history of nicotine dependence: Secondary | ICD-10-CM

## 2018-06-30 DIAGNOSIS — E869 Volume depletion, unspecified: Secondary | ICD-10-CM | POA: Insufficient documentation

## 2018-06-30 DIAGNOSIS — T50915A Adverse effect of multiple unspecified drugs, medicaments and biological substances, initial encounter: Secondary | ICD-10-CM | POA: Insufficient documentation

## 2018-06-30 DIAGNOSIS — S199XXA Unspecified injury of neck, initial encounter: Secondary | ICD-10-CM | POA: Diagnosis not present

## 2018-06-30 DIAGNOSIS — Z791 Long term (current) use of non-steroidal anti-inflammatories (NSAID): Secondary | ICD-10-CM | POA: Insufficient documentation

## 2018-06-30 DIAGNOSIS — R0902 Hypoxemia: Secondary | ICD-10-CM | POA: Diagnosis not present

## 2018-06-30 LAB — CBC WITH DIFFERENTIAL/PLATELET
ABS IMMATURE GRANULOCYTES: 0.05 10*3/uL (ref 0.00–0.07)
Basophils Absolute: 0 10*3/uL (ref 0.0–0.1)
Basophils Relative: 0 %
Eosinophils Absolute: 0 10*3/uL (ref 0.0–0.5)
Eosinophils Relative: 0 %
HCT: 43 % (ref 39.0–52.0)
HEMOGLOBIN: 14.3 g/dL (ref 13.0–17.0)
Immature Granulocytes: 1 %
Lymphocytes Relative: 4 %
Lymphs Abs: 0.3 10*3/uL — ABNORMAL LOW (ref 0.7–4.0)
MCH: 33.7 pg (ref 26.0–34.0)
MCHC: 33.3 g/dL (ref 30.0–36.0)
MCV: 101.4 fL — ABNORMAL HIGH (ref 80.0–100.0)
MONO ABS: 0.5 10*3/uL (ref 0.1–1.0)
Monocytes Relative: 6 %
Neutro Abs: 7.3 10*3/uL (ref 1.7–7.7)
Neutrophils Relative %: 89 %
Platelets: 99 10*3/uL — ABNORMAL LOW (ref 150–400)
RBC: 4.24 MIL/uL (ref 4.22–5.81)
RDW: 12.8 % (ref 11.5–15.5)
WBC: 8.2 10*3/uL (ref 4.0–10.5)
nRBC: 0 % (ref 0.0–0.2)

## 2018-06-30 LAB — URINALYSIS, ROUTINE W REFLEX MICROSCOPIC
BILIRUBIN URINE: NEGATIVE
Glucose, UA: NEGATIVE mg/dL
Ketones, ur: 5 mg/dL — AB
LEUKOCYTES UA: NEGATIVE
NITRITE: POSITIVE — AB
PROTEIN: 100 mg/dL — AB
Specific Gravity, Urine: 1.026 (ref 1.005–1.030)
pH: 6 (ref 5.0–8.0)

## 2018-06-30 LAB — COMPREHENSIVE METABOLIC PANEL
ALT: 31 U/L (ref 0–44)
AST: 33 U/L (ref 15–41)
Albumin: 3.9 g/dL (ref 3.5–5.0)
Alkaline Phosphatase: 35 U/L — ABNORMAL LOW (ref 38–126)
Anion gap: 12 (ref 5–15)
BUN: 18 mg/dL (ref 8–23)
CO2: 27 mmol/L (ref 22–32)
Calcium: 9.3 mg/dL (ref 8.9–10.3)
Chloride: 100 mmol/L (ref 98–111)
Creatinine, Ser: 0.96 mg/dL (ref 0.61–1.24)
GFR calc Af Amer: >60 mL/min (ref >60)
GFR calc non Af Amer: >60 mL/min (ref >60)
Glucose, Bld: 144 mg/dL — ABNORMAL HIGH (ref 70–99)
POTASSIUM: 3.4 mmol/L — AB (ref 3.5–5.1)
Sodium: 139 mmol/L (ref 135–145)
Total Bilirubin: 1.2 mg/dL (ref 0.3–1.2)
Total Protein: 6.5 g/dL (ref 6.5–8.1)

## 2018-06-30 LAB — I-STAT TROPONIN, ED: TROPONIN I, POC: 0 ng/mL (ref 0.00–0.08)

## 2018-06-30 MED ORDER — SODIUM CHLORIDE 0.9 % IV SOLN
1.0000 g | Freq: Once | INTRAVENOUS | Status: AC
  Administered 2018-06-30: 1 g via INTRAVENOUS
  Filled 2018-06-30: qty 10

## 2018-06-30 NOTE — Care Management Note (Signed)
Case Management Note  Patient Details  Name: Ricky Black MRN: [id number] Date of Birth: [birth year]  Subjective/Objective:                  fall  Action/Plan: ED CM spoke with the patient's wife at the bedside. She reports she found the patient on his knees on the floor in the bedroom. He has a walker at home but she states he forgets to use it. He also had a shower chair. She states 3 people had to attempt to get him up. Reports his physician told her to bring him to the ED due to the fall. The patient receives personal care assistance from a program with [facility]. Wife reports he has LTC insurance which pays for his personal care. His LTC insurance has visited the home and completed a needs assessment. The personal care assistants assist with bathing, transportation and meal preparation. Patient's wife would like home health services for additional assistance. Due to the recent fall, she would like a home health RN, PT, OT, HHA and SW if ordered. She was provided with the list of 3-5 star [facility] providers using CMS compare. She selected [facility] for home health services. Awaiting orders to fax to [facility] if patient is discharged from the ED.    Expected Discharge Date:                  Expected Discharge Plan:  [place]  In-House Referral:  Clinical Social Work  Discharge planning Services  CM Consult  Post Acute Care Choice:    Choice offered to:  Spouse  DME Arranged:    DME Agency:     HH Arranged:    [place] Agency:  [facility]  Status of Service:     If discussed at [place] of Stay Meetings, dates discussed:    Additional Comments:  Apolonio Schneiders, RN 06/30/2018, 8:13 PM

## 2018-06-30 NOTE — ED Notes (Signed)
Pt is resting quietly, equal and unlabored. Still unable to get a hold of his wife to come and get him.

## 2018-06-30 NOTE — ED Notes (Signed)
Updated charge nurse on patient status.

## 2018-06-30 NOTE — ED Triage Notes (Signed)
Pt presents from home with report of altered mental status, last seen normal yesterday.  Pt was seen here and discharged this morning.  Per EMS, wife found pt on the floor, unsure of fall.  Pt has no complaints.

## 2018-06-30 NOTE — Clinical Social Work Note (Signed)
Clinical Social Work Assessment  Patient Details  Name: Ricky Black MRN: [id number] Date of Birth: [birth year]  Date of referral:  06/30/18               Reason for consult:  Other (Comment Required)(Information/ Resources)                Permission sought to share information with:  Case Manager Permission granted to share information::     Name::        Agency::     Relationship::     Contact Information:     Housing/Transportation Living arrangements for the past 2 months:  Single Family Home Source of Information:  Spouse Patient Interpreter Needed:  None Criminal Activity/Legal Involvement Pertinent to Current Situation/Hospitalization:    Significant Relationships:  Spouse Lives with:  Spouse Do you feel safe going back to the place where you live?  Yes Need for family participation in patient care:  Yes (Comment)  Care giving concerns: CSW consulted for helping family add support at home. Pt seen last night and stayed into this morning because staff unable to contact family. Pt returned shortly after leaving due to a fall.  Social Worker assessment / plan:  CSW and RNCM met with pt's wife at pt's bedside. Pt did not participate in assessment. CSW provided pt's wife with list of Memory Care units in the [place] area if she feels that pt would be more appropriate there than at home. CSW explained that Memory Cares are paid for via private pay or Medicaid and family would have to reach out to them about availability. RNCM working with family to establish home health services.   Employment status:  Retired Nurse, adult PT Recommendations:  Not assessed at this time Information / Referral to community resources:  Other (Comment Required)(Memory Care)  Patient/Family's Response to care:  Pt's wife agreeable to the plan of care.   Patient/Family's Understanding of and Emotional Response to Diagnosis, Current Treatment, and Prognosis:  Pt's family  did not have any questions or concerns for CSW at this time.   Emotional Assessment Appearance:  Appears stated age Attitude/Demeanor/Rapport:    Affect (typically observed):  Unable to Assess Orientation:    Alcohol / Substance use:    Psych involvement (Current and /or in the community):  No (Comment)  Discharge Needs  Concerns to be addressed:  Lack of Support Readmission within the last 30 days:  No Current discharge risk:  None Barriers to Discharge:  Continued Medical Work up   [employer], LCSW 06/30/2018, 8:40 PM

## 2018-06-30 NOTE — Progress Notes (Addendum)
CSW and RNCM met with pt and pt's wife. Pt is from home with his wife. CSW provided list of memory care units to pt's wife due to pt's memory loss progression. Pt's wife to explore memory care options. Pt's wife understanding that memory care is paid for via private pay or Medicaid. RNCM working with family to establish home health services. Pt has PCA at home already established.   Wendelyn Breslow, [facility]  [phone]

## 2018-06-30 NOTE — ED Provider Notes (Signed)
[facility] EMERGENCY DEPARTMENT Provider Note   CSN: [id number] Arrival date & time: 06/30/18  1842     History   Chief Complaint Chief Complaint  Patient presents with  . Altered Mental Status    HPI Ricky Black is a 77 y.o. male.  The history is provided by the patient.  Altered Mental Status   This is a chronic problem. The current episode started more than 1 week ago. Progression since onset: waxing and wanes. Associated symptoms include confusion and weakness. Pertinent negatives include no seizures. Risk factors: Hx of dementia, lives at home with wife and she is unable to help provide for him. Continues to have falls and unable to take care of him. Another fall today.  His past medical history is significant for dementia.    Past Medical History:  Diagnosis Date  . Abnormal EKG    Prior tracing  with reading can't rule out old septal MI  . Bradycardia   . Dementia ([place])   . Dizziness   . Hypogonadism male   . Neuropathy    (R) Dorsal foot  . OAB (overactive bladder)   . Olecranon bursitis   . Orthostatic hypotension   . Spinal arthritis    ESI    Patient Active Problem List   Diagnosis Date Noted  . Bradycardia   . Dizziness   . Orthostatic hypotension   . Spinal arthritis   . Neuropathy   . Hypogonadism male   . Dementia ([place])   . Abnormal EKG   . HYPERTENSION 05/10/2007  . ARTHRITIS 05/10/2007    Past Surgical History:  Procedure Laterality Date  . COLONOSCOPY  04/15/2012  . LUMBAR LAMINECTOMY    . TOTAL HIP ARTHROPLASTY     Right  . UMBILICAL HERNIA REPAIR          Home Medications    Prior to Admission medications   Medication Sig Start Date End Date Taking? Authorizing Provider  bethanechol (URECHOLINE) 25 MG tablet Take 25 mg by mouth daily.    [provider]  celecoxib (CELEBREX) 200 MG capsule Take 200 mg by mouth daily.    [provider]  cyclobenzaprine (FLEXERIL) 5 MG tablet Take 1  tablet (5 mg total) by mouth 2 (two) times daily as needed for muscle spasms. 06/29/18   Gareth Morgan, MD  DONEPEZIL HCL PO Take 1 tablet by mouth at bedtime.    [provider]  finasteride (PROSCAR) 5 MG tablet Take 5 mg by mouth daily.    [provider]  HYDROCHLOROTHIAZIDE PO Take 1 tablet by mouth daily.    [provider]  HYDROcodone-acetaminophen (NORCO) 7.5-325 MG per tablet Take 1 tablet by mouth every 6 (six) hours as needed for pain.    [provider]  Memantine HCl (NAMENDA PO) Take 1 tablet by mouth 2 (two) times daily.    [provider]  Pregabalin (LYRICA PO) Take 1 capsule by mouth 2 (two) times daily.    [provider]  Sertraline HCl (ZOLOFT PO) Take 1 tablet by mouth daily.    [provider]  tamsulosin (FLOMAX) 0.4 MG CAPS capsule Take 0.8 mg by mouth at bedtime.    [provider]  VALSARTAN PO Take 1 tablet by mouth daily.    [provider]    Family History History reviewed. No pertinent family history.  Social History Social History   Tobacco Use  . Smoking status: Former Research scientist (life sciences)  . Smokeless  tobacco: Never Used  Substance Use Topics  . Alcohol use: No  . Drug use: Not on file     Allergies   Patient has no known allergies.   Review of Systems Review of Systems  Unable to perform ROS: Dementia (patient able to answer yes and no)  Constitutional: Negative for chills and fever.  HENT: Negative for ear pain and sore throat.   Eyes: Negative for pain and visual disturbance.  Respiratory: Negative for cough and shortness of breath.   Cardiovascular: Negative for chest pain and palpitations.  Gastrointestinal: Negative for abdominal pain and vomiting.  Genitourinary: Negative for dysuria and hematuria.  Musculoskeletal: Positive for gait problem. Negative for arthralgias and back pain.  Skin: Positive for wound (abrasion to head). Negative for color change and rash.    Neurological: Positive for weakness. Negative for seizures and syncope.  Psychiatric/Behavioral: Positive for confusion.  All other systems reviewed and are negative.    Physical Exam Updated Vital Signs  ED Triage Vitals [06/30/18 1845]  Enc Vitals Group     BP (!) 190/84     Pulse Rate 70     Resp (!) 30     Temp 99.5 F (37.5 C)     Temp src      SpO2 96 %     Weight      Height      Head Circumference      Peak Flow      Pain Score 0     Pain Loc      Pain Edu?      Excl. in [place]?     Physical Exam Vitals signs and nursing note reviewed.  Constitutional:      Appearance: He is well-developed. He is not ill-appearing.  HENT:     Head: Normocephalic and atraumatic.     Nose: Nose normal.     Mouth/Throat:     Mouth: Mucous membranes are dry.  Eyes:     Extraocular Movements: Extraocular movements intact.     Conjunctiva/sclera: Conjunctivae normal.     Pupils: Pupils are equal, round, and reactive to light.  Neck:     Musculoskeletal: Normal range of motion and neck supple.  Cardiovascular:     Rate and Rhythm: Normal rate and regular rhythm.     Pulses: Normal pulses.     Heart sounds: Normal heart sounds. No murmur.  Pulmonary:     Effort: Pulmonary effort is normal. No respiratory distress.     Breath sounds: Normal breath sounds.  Abdominal:     Palpations: Abdomen is soft.     Tenderness: There is no abdominal tenderness.  Musculoskeletal: Normal range of motion.        General: No swelling or tenderness.  Skin:    General: Skin is warm and dry.     Comments: Abrasion to scalp  Neurological:     General: No focal deficit present.     Mental Status: He is alert. He is disoriented.     Cranial Nerves: No cranial nerve deficit.     Sensory: No sensory deficit.     Motor: No weakness.  Psychiatric:        Mood and Affect: Mood normal.      ED Treatments / Results  Labs (all labs ordered are listed, but only abnormal results are  displayed) Labs Reviewed  CBC WITH DIFFERENTIAL/PLATELET - Abnormal; Notable for the following components:      Result Value   MCV  101.4 (*)    Platelets 99 (*)    Lymphs Abs 0.3 (*)    All other components within normal limits  COMPREHENSIVE METABOLIC PANEL - Abnormal; Notable for the following components:   Potassium 3.4 (*)    Glucose, Bld 144 (*)    Alkaline Phosphatase 35 (*)    All other components within normal limits  URINALYSIS, ROUTINE W REFLEX MICROSCOPIC - Abnormal; Notable for the following components:   Color, Urine AMBER (*)    APPearance HAZY (*)    Hgb urine dipstick LARGE (*)    Ketones, ur 5 (*)    Protein, ur 100 (*)    Nitrite POSITIVE (*)    Bacteria, UA FEW (*)    All other components within normal limits  URINE CULTURE  I-STAT TROPONIN, ED    EKG EKG Interpretation  Date/Time:  Thursday June 30 2018 18:46:19 EST Ventricular Rate:  72 PR Interval:    QRS Duration: 118 QT Interval:  413 QTC Calculation: 452 R Axis:   -33 Text Interpretation:  Sinus rhythm Incomplete left bundle branch block LVH with secondary repolarization abnormality Anterior Q waves, possibly due to LVH No significant change since last tracing Confirmed by Lennice Sites [phone]) on 06/30/2018 6:49:36 PM   Radiology Ct Head Wo Contrast  Result Date: 06/30/2018 CLINICAL DATA:  Altered mental status.  Head trauma. EXAM: CT HEAD WITHOUT CONTRAST CT CERVICAL SPINE WITHOUT CONTRAST TECHNIQUE: Multidetector CT imaging of the head and cervical spine was performed following the standard protocol without intravenous contrast. Multiplanar CT image reconstructions of the cervical spine were also generated. COMPARISON:  Report from 09/07/2002 FINDINGS: Despite efforts by the technologist and patient, motion artifact is present on today's exam and could not be eliminated. This reduces exam sensitivity and specificity. CT HEAD FINDINGS Brain: The brainstem, cerebellum, cerebral peduncles,  thalami, basal ganglia, basilar cisterns, and ventricular system appear within normal limits. No intracranial hemorrhage, mass lesion, or acute CVA. Vascular: Unremarkable Skull: Unremarkable Sinuses/Orbits: Mild chronic right maxillary sinusitis. Other: No supplemental non-categorized findings. CT CERVICAL SPINE FINDINGS Alignment: No vertebral subluxation is observed. Skull base and vertebrae: There is congenital fusion at C2-3 between the vertebral bodies and posterior elements. Prominent loss of articular space anteriorly at C1-2. Schmorl's nodes at C4-5. No appreciable fracture. Soft tissues and spinal canal: No prevertebral soft tissue swelling. Bilateral common carotid atherosclerotic calcification. Disc levels: Facet and uncinate spurring cause foraminal impingement on the right at C3-4, C4-5, C5-6, and C6-7; and on the left at C4-5, C5-6, C6-7, and C7-T1. There is central narrowing of the thecal sac due to spurring at C3-4 and C4-5. Upper chest: Unremarkable Other: No supplemental non-categorized findings. IMPRESSION: 1. No acute intracranial findings or acute cervical spine findings. 2. Impingement at all levels between C3 and T1 due to facet and uncinate spurring. 3. Congenital fusion at C2-3. 4. Atherosclerosis. 5. Mild chronic right maxillary sinusitis. Electronically Signed   By: Van Clines M.D.   On: 06/30/2018 20:50   Ct Cervical Spine Wo Contrast  Result Date: 06/30/2018 CLINICAL DATA:  Altered mental status.  Head trauma. EXAM: CT HEAD WITHOUT CONTRAST CT CERVICAL SPINE WITHOUT CONTRAST TECHNIQUE: Multidetector CT imaging of the head and cervical spine was performed following the standard protocol without intravenous contrast. Multiplanar CT image reconstructions of the cervical spine were also generated. COMPARISON:  Report from 09/07/2002 FINDINGS: Despite efforts by the technologist and patient, motion artifact is present on today's exam and could not be eliminated. This reduces  exam sensitivity and specificity. CT HEAD FINDINGS Brain: The brainstem, cerebellum, cerebral peduncles, thalami, basal ganglia, basilar cisterns, and ventricular system appear within normal limits. No intracranial hemorrhage, mass lesion, or acute CVA. Vascular: Unremarkable Skull: Unremarkable Sinuses/Orbits: Mild chronic right maxillary sinusitis. Other: No supplemental non-categorized findings. CT CERVICAL SPINE FINDINGS Alignment: No vertebral subluxation is observed. Skull base and vertebrae: There is congenital fusion at C2-3 between the vertebral bodies and posterior elements. Prominent loss of articular space anteriorly at C1-2. Schmorl's nodes at C4-5. No appreciable fracture. Soft tissues and spinal canal: No prevertebral soft tissue swelling. Bilateral common carotid atherosclerotic calcification. Disc levels: Facet and uncinate spurring cause foraminal impingement on the right at C3-4, C4-5, C5-6, and C6-7; and on the left at C4-5, C5-6, C6-7, and C7-T1. There is central narrowing of the thecal sac due to spurring at C3-4 and C4-5. Upper chest: Unremarkable Other: No supplemental non-categorized findings. IMPRESSION: 1. No acute intracranial findings or acute cervical spine findings. 2. Impingement at all levels between C3 and T1 due to facet and uncinate spurring. 3. Congenital fusion at C2-3. 4. Atherosclerosis. 5. Mild chronic right maxillary sinusitis. Electronically Signed   By: Van Clines M.D.   On: 06/30/2018 20:50   Ct Angio Abd/pel W And/or Wo Contrast  Result Date: 06/29/2018 CLINICAL DATA:  Low back pain beginning this morning. Evaluate for abdominal aortic aneurysm. EXAM: CT ANGIOGRAPHY ABDOMEN AND PELVIS WITH CONTRAST AND WITHOUT CONTRAST TECHNIQUE: Multidetector CT imaging of the abdomen and pelvis was performed using the standard protocol during bolus administration of intravenous contrast. Multiplanar reconstructed images and MIPs were obtained and reviewed to evaluate the  vascular anatomy. CONTRAST:  147mL ISOVUE-370 IOPAMIDOL (ISOVUE-370) INJECTION 76% COMPARISON:  CT abdomen and pelvis - 04/27/2016; 03/01/2013; 07/12/2003 FINDINGS: VASCULAR Aorta: Moderate amount of predominantly calcified atherosclerotic plaque within a normal caliber abdominal aorta, not resulting in hemodynamically significant stenosis. No abdominal aortic dissection or periaortic stranding. Celiac: There is a minimal amount eccentric calcified plaque about the origin of the celiac artery, not resulting in hemodynamically significant stenosis. Conventional branching pattern. SMA: There is a minimal amount of mixed calcified and noncalcified atherosclerotic plaque involving the origin and proximal aspect of the SMA, not resulting in hemodynamically significant stenosis. Conventional branching pattern. The distal tributaries of the SMA appear widely patent without discrete intraluminal filling defect suggest distal embolism. Renals: Solitary bilaterally; there is a moderate amount of eccentric calcified atherosclerotic plaque involving the origin of the bilateral renal arteries which approaches 50% luminal narrowing bilaterally though is without associated asymmetric renal atrophy or perinephric stranding. IMA: Widely patent. Inflow: There is a minimal amount of crescentic calcified atherosclerotic plaque within the bilateral normal caliber common iliac arteries, not resulting in hemodynamically significant stenosis. The bilateral external iliac arteries are tortuous though widely patent of normal caliber. The bilateral internal iliac arteries are mildly disease though patent and of normal caliber. Proximal Outflow: There is a minimal amount of eccentric probably calcified atherosclerotic plaque involving the bilateral common femoral arteries, not resulting in hemodynamically significant stenosis. The bilateral superficial and deep femoral arteries appear widely patent throughout their imaged courses. Veins: The  IVC and pelvic venous system appear widely patent on this arterial phase examination. Review of the MIP images confirms the above findings. _________________________________________________________ NON-VASCULAR Lower chest: Limited visualization of the lower thorax demonstrates minimal subsegmental atelectasis within the image right lower lobe. No focal airspace opacities. No pleural effusion. Hepatobiliary: There is diffuse decreased attenuation of the hepatic parenchyma on this postcontrast examination  suggestive of hepatic steatosis. No discrete hyperenhancing hepatic lesions. Normal appearance of the gallbladder given degree distention. No radiopaque gallstones. No ascites. Pancreas: Normal appearance of the pancreas Spleen: Normal appearance of the spleen Adrenals/Urinary Tract: There is symmetric enhancement of the bilateral kidneys. Note is made of an approximately 2.4 cm hypoattenuating (11 Hounsfield unit) left-sided renal cyst (image 37, series 5 as well as an approximately 3.3 cm exophytic hypoattenuating right-sided renal cyst (image 24). There is geographic cortical atrophy involving the superior pole of the right kidney (image coronal image 134, series 8). No discrete worrisome renal lesions. No definite evidence of nephrolithiasis on this postcontrast examination. There is a very minimal amount of likely age and body habitus related perinephric stranding. No urine obstruction. There is mild thickening of the bilateral adrenal glands without discrete nodule. Normal appearance of the urinary bladder given degree of distention. Stomach/Bowel: Scattered colonic diverticulosis primarily involving the distal aspect of the descending and sigmoid colon, without evidence of superimposed acute diverticulitis. Normal appearance of the terminal ileum and appendix. Moderate size hiatal hernia. No pneumoperitoneum, pneumatosis or portal venous gas. Lymphatic: No bulky retroperitoneal, mesenteric, pelvic or inguinal  lymphadenopathy. Reproductive: Dystrophic calcifications within normal sized prostate gland, though note, evaluation degraded secondary streak artifact from the patient's right total hip prosthesis. Other: Minimal amount of subcutaneous edema about the midline of the low back. Musculoskeletal: No definite acute or aggressive osseous abnormalities. Post right total hip replacement without definitive evidence of hardware failure loosening though note the caudal aspect of the right femoral stem component was not imaged. There are multiple peripherally calcified ossicles superior to the right greater trochanter likely the sequela of prior trauma and/or iatrogenic. Severe degenerative change of the contralateral left hip with joint space loss, subchondral sclerosis and osteophytosis. No evidence avascular necrosis. Moderate severe degenerative change throughout the lumbar spine. Stigmata of DISH throughout the thoracic and lumbar spine. Post L5 laminectomy with linear calcifications posterior to the sacrum with associated linear subcutaneous scarring. IMPRESSION: VASCULAR 1. No definite explanation for patient's acute back pain. Specifically, no evidence of abdominal aortic aneurysm or dissection. 2.  Aortic Atherosclerosis (ICD10-I70.0). 3. Suspected hemodynamically significant stenosis ease involving the bilateral renal arteries without associated asymmetric renal atrophy or delayed renal enhancement. NON-VASCULAR 1. Colonic diverticulosis without evidence of superimposed acute diverticulitis. 2. Suspected hepatic steatosis. Correlation with LFTs could be performed as clinically indicated. 3. Moderate size hiatal hernia. Electronically Signed   By: Sandi Mariscal M.D.   On: 06/29/2018 18:53    Procedures Procedures (including critical care time)  Medications Ordered in ED Medications  cefTRIAXone (ROCEPHIN) 1 g in sodium chloride 0.9 % 100 mL IVPB (1 g Intravenous New Bag/Given 06/30/18 2328)     Initial  Impression / Assessment and Plan / ED Course  I have reviewed the triage vital signs and the nursing notes.  Pertinent labs & imaging results that were available during my care of the patient were reviewed by me and considered in my medical decision making (see chart for details).     SHAFER SWAMY is a 77 year old male with history of dementia, hypertension, altered mental status.  Patient pleasantly demented on exam.  Does not have any specific pain.  Does have some abrasions to his head.  Family member at the bedside states that patient has had multiple falls over the last 3 days.  Has a history of dementia but has had regression over the last several days.  Patient is on chronic narcotic pain medicine.  No new medications at this time.  Patient was seen here yesterday and had a CTA of his abdomen pelvis that was unremarkable.  Patient appears grossly neurologically intact.  No bony tenderness on exam.  Patient with normal vitals.  No fever.  No abdominal tenderness on exam. No concern for stroke, no focal exam.  No significant anemia, electrolyte abnormality, kidney injury.  CT of the neck and head unremarkable.  Patient with possible urinary tract infection with positive nitrates, few bacteria.  Will treat with IV Rocephin and get urine culture.  Patient to be admitted to hospitalist service for further care.  Will likely need placement.  Hemodynamically stable throughout my care.  This chart was dictated using voice recognition software.  Despite best efforts to proofread,  errors can occur which can change the documentation meaning.   Final Clinical Impressions(s) / ED Diagnoses   Final diagnoses:  Urinary tract infection without hematuria, site unspecified    ED Discharge Orders    None       Lennice Sites, DO 07/01/18 0159

## 2018-06-30 NOTE — Progress Notes (Addendum)
CSW consulted as pt's wife hadn't arrived to pick pt up. CSW spoke with Ms. Perkins listed in chart and was informed that she has came to pick pt up at this time. CSW thanked her and wished her well.   No further CSW needs at this time. CSW will sign off.    Ricky Black. Eugune Sine, MSW, [facility] Clinical Social Worker [phone]

## 2018-07-01 ENCOUNTER — Encounter (HOSPITAL_COMMUNITY): Payer: Self-pay

## 2018-07-01 ENCOUNTER — Emergency Department (HOSPITAL_COMMUNITY): Payer: Medicare Other

## 2018-07-01 ENCOUNTER — Observation Stay (HOSPITAL_COMMUNITY): Payer: Medicare Other

## 2018-07-01 ENCOUNTER — Inpatient Hospital Stay (HOSPITAL_COMMUNITY)
Admission: EM | Admit: 2018-07-01 | Discharge: 2018-07-04 | DRG: 071 | Disposition: A | Discharge status: Skilled Nursing Facility | Payer: Medicare Other | Attending: Internal Medicine | Admitting: Internal Medicine

## 2018-07-01 DIAGNOSIS — F0390 Unspecified dementia without behavioral disturbance: Secondary | ICD-10-CM | POA: Diagnosis not present

## 2018-07-01 DIAGNOSIS — R3129 Other microscopic hematuria: Secondary | ICD-10-CM | POA: Diagnosis not present

## 2018-07-01 DIAGNOSIS — M5126 Other intervertebral disc displacement, lumbar region: Secondary | ICD-10-CM | POA: Diagnosis present

## 2018-07-01 DIAGNOSIS — G8929 Other chronic pain: Secondary | ICD-10-CM | POA: Diagnosis not present

## 2018-07-01 DIAGNOSIS — M5127 Other intervertebral disc displacement, lumbosacral region: Secondary | ICD-10-CM | POA: Diagnosis present

## 2018-07-01 DIAGNOSIS — M549 Dorsalgia, unspecified: Secondary | ICD-10-CM | POA: Diagnosis present

## 2018-07-01 DIAGNOSIS — G9341 Metabolic encephalopathy: Principal | ICD-10-CM | POA: Diagnosis present

## 2018-07-01 DIAGNOSIS — M48061 Spinal stenosis, lumbar region without neurogenic claudication: Secondary | ICD-10-CM | POA: Diagnosis present

## 2018-07-01 DIAGNOSIS — I10 Essential (primary) hypertension: Secondary | ICD-10-CM | POA: Diagnosis present

## 2018-07-01 DIAGNOSIS — G9340 Encephalopathy, unspecified: Secondary | ICD-10-CM | POA: Diagnosis present

## 2018-07-01 DIAGNOSIS — T68XXXA Hypothermia, initial encounter: Secondary | ICD-10-CM | POA: Diagnosis not present

## 2018-07-01 DIAGNOSIS — S299XXA Unspecified injury of thorax, initial encounter: Secondary | ICD-10-CM | POA: Diagnosis not present

## 2018-07-01 DIAGNOSIS — R410 Disorientation, unspecified: Secondary | ICD-10-CM | POA: Diagnosis present

## 2018-07-01 DIAGNOSIS — R0902 Hypoxemia: Secondary | ICD-10-CM | POA: Diagnosis not present

## 2018-07-01 DIAGNOSIS — R296 Repeated falls: Secondary | ICD-10-CM | POA: Diagnosis present

## 2018-07-01 DIAGNOSIS — M545 Low back pain: Secondary | ICD-10-CM | POA: Diagnosis not present

## 2018-07-01 DIAGNOSIS — N3001 Acute cystitis with hematuria: Secondary | ICD-10-CM | POA: Diagnosis not present

## 2018-07-01 DIAGNOSIS — D696 Thrombocytopenia, unspecified: Secondary | ICD-10-CM | POA: Diagnosis present

## 2018-07-01 DIAGNOSIS — Z79891 Long term (current) use of opiate analgesic: Secondary | ICD-10-CM

## 2018-07-01 DIAGNOSIS — Z96641 Presence of right artificial hip joint: Secondary | ICD-10-CM | POA: Diagnosis present

## 2018-07-01 DIAGNOSIS — R4182 Altered mental status, unspecified: Secondary | ICD-10-CM | POA: Diagnosis not present

## 2018-07-01 DIAGNOSIS — N390 Urinary tract infection, site not specified: Secondary | ICD-10-CM

## 2018-07-01 DIAGNOSIS — Z79899 Other long term (current) drug therapy: Secondary | ICD-10-CM

## 2018-07-01 DIAGNOSIS — S199XXA Unspecified injury of neck, initial encounter: Secondary | ICD-10-CM | POA: Diagnosis not present

## 2018-07-01 DIAGNOSIS — E876 Hypokalemia: Secondary | ICD-10-CM | POA: Diagnosis present

## 2018-07-01 DIAGNOSIS — M47816 Spondylosis without myelopathy or radiculopathy, lumbar region: Secondary | ICD-10-CM | POA: Diagnosis not present

## 2018-07-01 DIAGNOSIS — I440 Atrioventricular block, first degree: Secondary | ICD-10-CM | POA: Diagnosis not present

## 2018-07-01 DIAGNOSIS — Z87891 Personal history of nicotine dependence: Secondary | ICD-10-CM

## 2018-07-01 DIAGNOSIS — S0990XA Unspecified injury of head, initial encounter: Secondary | ICD-10-CM | POA: Diagnosis not present

## 2018-07-01 DIAGNOSIS — I499 Cardiac arrhythmia, unspecified: Secondary | ICD-10-CM | POA: Diagnosis not present

## 2018-07-01 DIAGNOSIS — W19XXXA Unspecified fall, initial encounter: Secondary | ICD-10-CM | POA: Diagnosis present

## 2018-07-01 DIAGNOSIS — Y92009 Unspecified place in unspecified non-institutional (private) residence as the place of occurrence of the external cause: Secondary | ICD-10-CM

## 2018-07-01 LAB — URINALYSIS, ROUTINE W REFLEX MICROSCOPIC
Bilirubin Urine: NEGATIVE
Glucose, UA: NEGATIVE mg/dL
Ketones, ur: 5 mg/dL — AB
Nitrite: POSITIVE — AB
PROTEIN: 100 mg/dL — AB
Specific Gravity, Urine: 1.025 (ref 1.005–1.030)
WBC, UA: >50 WBC/hpf — ABNORMAL HIGH (ref 0–5)
pH: 6 (ref 5.0–8.0)

## 2018-07-01 LAB — AMMONIA: Ammonia: 25 umol/L (ref 9–35)

## 2018-07-01 LAB — CK: Total CK: 665 U/L — ABNORMAL HIGH (ref 49–397)

## 2018-07-01 LAB — URINE CULTURE: Culture: NO GROWTH

## 2018-07-01 LAB — I-STAT CG4 LACTIC ACID, ED: Lactic Acid, Venous: 1.12 mmol/L (ref 0.5–1.9)

## 2018-07-01 MED ORDER — CELECOXIB 200 MG PO CAPS
200.0000 mg | ORAL_CAPSULE | Freq: Every day | ORAL | Status: DC
  Administered 2018-07-01: 200 mg via ORAL
  Filled 2018-07-01: qty 1

## 2018-07-01 MED ORDER — HYDROCODONE-ACETAMINOPHEN 7.5-325 MG PO TABS: 1.0000 | ORAL_TABLET | Freq: Four times a day (QID) | ORAL | Status: DC | PRN

## 2018-07-01 MED ORDER — ONDANSETRON HCL 4 MG/2ML IJ SOLN: 4.0000 mg | Freq: Four times a day (QID) | INTRAMUSCULAR | Status: DC | PRN

## 2018-07-01 MED ORDER — TAMSULOSIN HCL 0.4 MG PO CAPS
0.8000 mg | ORAL_CAPSULE | Freq: Every day | ORAL | Status: DC
  Administered 2018-07-01: 0.8 mg via ORAL
  Filled 2018-07-01: qty 2

## 2018-07-01 MED ORDER — FINASTERIDE 5 MG PO TABS
5.0000 mg | ORAL_TABLET | Freq: Every day | ORAL | Status: DC
  Administered 2018-07-01: 5 mg via ORAL
  Filled 2018-07-01: qty 1

## 2018-07-01 MED ORDER — CIPROFLOXACIN HCL 500 MG PO TABS: 500.0000 mg | ORAL_TABLET | Freq: Two times a day (BID) | ORAL | 0 refills | Status: DC

## 2018-07-01 MED ORDER — IRBESARTAN 150 MG PO TABS
150.0000 mg | ORAL_TABLET | Freq: Every day | ORAL | Status: DC
  Administered 2018-07-01: 150 mg via ORAL
  Filled 2018-07-01: qty 1

## 2018-07-01 MED ORDER — ACETAMINOPHEN 325 MG PO TABS
650.0000 mg | ORAL_TABLET | Freq: Four times a day (QID) | ORAL | Status: DC | PRN
  Administered 2018-07-01 (×2): 650 mg via ORAL
  Filled 2018-07-01 (×2): qty 2

## 2018-07-01 MED ORDER — MEMANTINE HCL 10 MG PO TABS
10.0000 mg | ORAL_TABLET | Freq: Two times a day (BID) | ORAL | Status: DC
  Administered 2018-07-01 (×2): 10 mg via ORAL
  Filled 2018-07-01 (×3): qty 1

## 2018-07-01 MED ORDER — DONEPEZIL HCL 10 MG PO TABS
10.0000 mg | ORAL_TABLET | Freq: Every day | ORAL | Status: DC
  Administered 2018-07-01: 10 mg via ORAL
  Filled 2018-07-01 (×2): qty 1

## 2018-07-01 MED ORDER — PREGABALIN 75 MG PO CAPS
75.0000 mg | ORAL_CAPSULE | Freq: Two times a day (BID) | ORAL | Status: DC
  Administered 2018-07-01 (×2): 75 mg via ORAL
  Filled 2018-07-01 (×2): qty 1

## 2018-07-01 MED ORDER — BETHANECHOL CHLORIDE 25 MG PO TABS
25.0000 mg | ORAL_TABLET | Freq: Every day | ORAL | Status: DC
  Administered 2018-07-01: 25 mg via ORAL
  Filled 2018-07-01: qty 1

## 2018-07-01 MED ORDER — POTASSIUM CHLORIDE CRYS ER 20 MEQ PO TBCR
40.0000 meq | EXTENDED_RELEASE_TABLET | Freq: Once | ORAL | Status: AC
  Administered 2018-07-01: 40 meq via ORAL
  Filled 2018-07-01: qty 2

## 2018-07-01 MED ORDER — HYDROCHLOROTHIAZIDE 25 MG PO TABS
25.0000 mg | ORAL_TABLET | Freq: Every day | ORAL | Status: DC
  Administered 2018-07-01: 25 mg via ORAL
  Filled 2018-07-01: qty 1

## 2018-07-01 MED ORDER — ESCITALOPRAM OXALATE 10 MG PO TABS
20.0000 mg | ORAL_TABLET | Freq: Every day | ORAL | Status: DC
  Administered 2018-07-01: 20 mg via ORAL
  Filled 2018-07-01: qty 2

## 2018-07-01 MED ORDER — ONDANSETRON HCL 4 MG PO TABS: 4.0000 mg | ORAL_TABLET | Freq: Four times a day (QID) | ORAL | Status: DC | PRN

## 2018-07-01 MED ORDER — ACETAMINOPHEN 650 MG RE SUPP: 650.0000 mg | Freq: Four times a day (QID) | RECTAL | Status: DC | PRN

## 2018-07-01 NOTE — Discharge Summary (Signed)
Physician Discharge Summary  Ricky Black BTD:[id number] DOB: 1941/05/09 DOA: 06/30/2018  PCP: Marton Redwood, MD  Admit date: 06/30/2018  Discharge date: 07/01/2018  Admitted From: Home  Disposition:  Home with Home health  Discharge Condition: Stable  CODE STATUS:  Full  Diet recommendation: Heart healthy  Brief/Interim Summary:  Patient is 77 years old male with past medical history of dementia, hypertension, back pain and degenerative joint disease presented to hospital with some confusion and back pain.  Patient does take Norco for back pain.  Patient lives with his wife at home who is been taking care of him but he did have difficulty ambulating so the wife was concerned about it.  Patient was then brought into the hospital.  Urinalysis showed some nitrates but no white cells.  He will be given a course of antibiotic to complete Omnicef.  His confusion was thought to be embolic encephalopathy secondary to polypharmacy volume depletion and possible UTI.  CT of the head and neck were negative.  Patient underwent a MRI of the lumbar spine which showed lumbar spinal canal stenosis with degenerative changes.  MRI of the thoracic spine also showed multilevel disc and facet degenerative changes.  Patient felt better while in the hospital.  He was seen by physical therapy and Occupational Therapy.  Physical therapy recommending home health on discharge.  At this time, patient has been considered stable for disposition home with home health.  I also spoke with the patient's wife regarding the plan for disposition and follow-up.  Patient will be given a course of antibiotics to complete on discharge.   Discharge Diagnoses:  Principal Problem:   Back pain, secondary to degenerative joint disease Active Problems:   Essential hypertension   Dementia (HCC) Metabolic encephalopathy- resolved   Hematuria, microscopic Possible UTI   Discharge Instructions  Discharge Instructions    Diet -  low sodium heart healthy   Complete by:  As directed    Discharge instructions   Complete by:  As directed    Follow up with Primary care physician in one week. Continue home PT on discharge. Increase fluid intake.   Increase activity slowly   Complete by:  As directed      Allergies as of 07/01/2018   No Known Allergies     Medication List    TAKE these medications   bethanechol 25 MG tablet Commonly known as:  URECHOLINE Take 25 mg by mouth daily.   celecoxib 200 MG capsule Commonly known as:  CELEBREX Take 200 mg by mouth daily.   ciprofloxacin 500 MG tablet Commonly known as:  CIPRO Take 1 tablet (500 mg total) by mouth 2 (two) times daily for 5 days.   cyclobenzaprine 5 MG tablet Commonly known as:  FLEXERIL Take 1 tablet (5 mg total) by mouth 2 (two) times daily as needed for muscle spasms.   DONEPEZIL HCL PO Take 1 tablet by mouth at bedtime.   finasteride 5 MG tablet Commonly known as:  PROSCAR Take 5 mg by mouth daily.   HYDROCHLOROTHIAZIDE PO Take 1 tablet by mouth daily.   HYDROcodone-acetaminophen 7.5-325 MG tablet Commonly known as:  NORCO Take 1 tablet by mouth every 6 (six) hours as needed for pain.   LYRICA PO Take 1 capsule by mouth 2 (two) times daily.   NAMENDA PO Take 1 tablet by mouth 2 (two) times daily.   tamsulosin 0.4 MG Caps capsule Commonly known as:  FLOMAX Take 0.8 mg by mouth at bedtime.  VALSARTAN PO Take 1 tablet by mouth daily.   ZOLOFT PO Take 1 tablet by mouth daily.       No Known Allergies  Consultations:  None   Procedures/Studies: Ct Head Wo Contrast  Result Date: 06/30/2018 CLINICAL DATA:  Altered mental status.  Head trauma. EXAM: CT HEAD WITHOUT CONTRAST CT CERVICAL SPINE WITHOUT CONTRAST TECHNIQUE: Multidetector CT imaging of the head and cervical spine was performed following the standard protocol without intravenous contrast. Multiplanar CT image reconstructions of the cervical spine were also  generated. COMPARISON:  Report from 09/07/2002 FINDINGS: Despite efforts by the technologist and patient, motion artifact is present on today's exam and could not be eliminated. This reduces exam sensitivity and specificity. CT HEAD FINDINGS Brain: The brainstem, cerebellum, cerebral peduncles, thalami, basal ganglia, basilar cisterns, and ventricular system appear within normal limits. No intracranial hemorrhage, mass lesion, or acute CVA. Vascular: Unremarkable Skull: Unremarkable Sinuses/Orbits: Mild chronic right maxillary sinusitis. Other: No supplemental non-categorized findings. CT CERVICAL SPINE FINDINGS Alignment: No vertebral subluxation is observed. Skull base and vertebrae: There is congenital fusion at C2-3 between the vertebral bodies and posterior elements. Prominent loss of articular space anteriorly at C1-2. Schmorl's nodes at C4-5. No appreciable fracture. Soft tissues and spinal canal: No prevertebral soft tissue swelling. Bilateral common carotid atherosclerotic calcification. Disc levels: Facet and uncinate spurring cause foraminal impingement on the right at C3-4, C4-5, C5-6, and C6-7; and on the left at C4-5, C5-6, C6-7, and C7-T1. There is central narrowing of the thecal sac due to spurring at C3-4 and C4-5. Upper chest: Unremarkable Other: No supplemental non-categorized findings. IMPRESSION: 1. No acute intracranial findings or acute cervical spine findings. 2. Impingement at all levels between C3 and T1 due to facet and uncinate spurring. 3. Congenital fusion at C2-3. 4. Atherosclerosis. 5. Mild chronic right maxillary sinusitis. Electronically Signed   By: Van Clines M.D.   On: 06/30/2018 20:50   Ct Cervical Spine Wo Contrast  Result Date: 06/30/2018 CLINICAL DATA:  Altered mental status.  Head trauma. EXAM: CT HEAD WITHOUT CONTRAST CT CERVICAL SPINE WITHOUT CONTRAST TECHNIQUE: Multidetector CT imaging of the head and cervical spine was performed following the standard  protocol without intravenous contrast. Multiplanar CT image reconstructions of the cervical spine were also generated. COMPARISON:  Report from 09/07/2002 FINDINGS: Despite efforts by the technologist and patient, motion artifact is present on today's exam and could not be eliminated. This reduces exam sensitivity and specificity. CT HEAD FINDINGS Brain: The brainstem, cerebellum, cerebral peduncles, thalami, basal ganglia, basilar cisterns, and ventricular system appear within normal limits. No intracranial hemorrhage, mass lesion, or acute CVA. Vascular: Unremarkable Skull: Unremarkable Sinuses/Orbits: Mild chronic right maxillary sinusitis. Other: No supplemental non-categorized findings. CT CERVICAL SPINE FINDINGS Alignment: No vertebral subluxation is observed. Skull base and vertebrae: There is congenital fusion at C2-3 between the vertebral bodies and posterior elements. Prominent loss of articular space anteriorly at C1-2. Schmorl's nodes at C4-5. No appreciable fracture. Soft tissues and spinal canal: No prevertebral soft tissue swelling. Bilateral common carotid atherosclerotic calcification. Disc levels: Facet and uncinate spurring cause foraminal impingement on the right at C3-4, C4-5, C5-6, and C6-7; and on the left at C4-5, C5-6, C6-7, and C7-T1. There is central narrowing of the thecal sac due to spurring at C3-4 and C4-5. Upper chest: Unremarkable Other: No supplemental non-categorized findings. IMPRESSION: 1. No acute intracranial findings or acute cervical spine findings. 2. Impingement at all levels between C3 and T1 due to facet and uncinate spurring. 3. Congenital  fusion at C2-3. 4. Atherosclerosis. 5. Mild chronic right maxillary sinusitis. Electronically Signed   By: Van Clines M.D.   On: 06/30/2018 20:50   Mr Thoracic Spine Wo Contrast  Result Date: 07/01/2018 CLINICAL DATA:  77 y/o M; worsening back pain, confusion, generalized weakness over the past couple of days. EXAM: MRI  THORACIC AND LUMBAR SPINE WITHOUT CONTRAST TECHNIQUE: Multiplanar and multiecho pulse sequences of the thoracic and lumbar spine were obtained without intravenous contrast. The patient was confused and moving, unable to continue the exam, lumbar axial sequences were not acquired. COMPARISON:  06/29/2018 CT abdomen pelvis. FINDINGS: MRI THORACIC SPINE FINDINGS Alignment:  Physiologic. Vertebrae: No fracture, evidence of discitis, or bone lesion. Cord:  Normal signal and morphology. Paraspinal and other soft tissues: Negative. Disc levels: Mild multilevel disc and facet degenerative changes. No significant foraminal or canal stenosis. MRI LUMBAR SPINE FINDINGS Segmentation:  Standard. Alignment:  Straightening of lumbar lordosis.  No listhesis. Vertebrae: No fracture, evidence of discitis, or bone lesion. Bilateral L3-4 facet edema, greater on the right, with edema in surrounding soft tissues, likely related to degenerative facet arthritis. Conus medullaris and cauda equina: Conus extends to the L1 level. Conus and cauda equina appear normal. Paraspinal and other soft tissues: Negative. Disc levels: Sagittal sequences only. 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 L1-2: No significant disc displacement, foraminal stenosis, or canal stenosis. L2-3: Disc bulge with left-greater-than-right facet hypertrophy. Mild left foraminal stenosis. Moderate to severe spinal canal stenosis. L3-4: Disc bulge with advanced bilateral facet hypertrophy, greater on the right. Moderate bilateral foraminal stenosis. Severe spinal canal stenosis. L4-5: Disc bulge with endplate marginal osteophytes and advanced facet hypertrophy. Moderate right and severe left foraminal stenosis. Mild spinal canal stenosis. L5-S1: Disc bulge with endplate marginal osteophytes and advanced bilateral facet hypertrophy. Moderate to severe bilateral foraminal stenosis. Mild spinal canal stenosis. The disc bulge narrows the bilateral recesses  with contact on the descending S1 nerve roots. IMPRESSION: MR THORACIC SPINE IMPRESSION 1. No acute osseous or cord signal abnormality. 2. Mild multilevel discogenic degenerative changes of the thoracic spine. No foraminal or canal stenosis. MR LUMBAR SPINE IMPRESSION 1. Sagittal sequences only. 2. No acute osseous abnormality or malalignment. 3. 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 4. Bilateral L3-4 facet edema, likely degenerative. 5. Moderate to severe L2-3 and severe L3-4 spinal canal stenosis. Multilevel high-grade foraminal stenosis. Electronically Signed   By: Kristine Garbe M.D.   On: 07/01/2018 03:47   Mr Lumbar Spine Wo Contrast  Result Date: 07/01/2018 CLINICAL DATA:  77 y/o M; worsening back pain, confusion, generalized weakness over the past couple of days. EXAM: MRI THORACIC AND LUMBAR SPINE WITHOUT CONTRAST TECHNIQUE: Multiplanar and multiecho pulse sequences of the thoracic and lumbar spine were obtained without intravenous contrast. The patient was confused and moving, unable to continue the exam, lumbar axial sequences were not acquired. COMPARISON:  06/29/2018 CT abdomen pelvis. FINDINGS: MRI THORACIC SPINE FINDINGS Alignment:  Physiologic. Vertebrae: No fracture, evidence of discitis, or bone lesion. Cord:  Normal signal and morphology. Paraspinal and other soft tissues: Negative. Disc levels: Mild multilevel disc and facet degenerative changes. No significant foraminal or canal stenosis. MRI LUMBAR SPINE FINDINGS Segmentation:  Standard. Alignment:  Straightening of lumbar lordosis.  No listhesis. Vertebrae: No fracture, evidence of discitis, or bone lesion. Bilateral L3-4 facet edema, greater on the right, with edema in surrounding soft tissues, likely related to degenerative facet  arthritis. Conus medullaris and cauda equina: Conus extends to the L1 level. Conus and cauda  equina appear normal. Paraspinal and other soft tissues: Negative. Disc levels: Sagittal sequences only. 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 L1-2: No significant disc displacement, foraminal stenosis, or canal stenosis. L2-3: Disc bulge with left-greater-than-right facet hypertrophy. Mild left foraminal stenosis. Moderate to severe spinal canal stenosis. L3-4: Disc bulge with advanced bilateral facet hypertrophy, greater on the right. Moderate bilateral foraminal stenosis. Severe spinal canal stenosis. L4-5: Disc bulge with endplate marginal osteophytes and advanced facet hypertrophy. Moderate right and severe left foraminal stenosis. Mild spinal canal stenosis. L5-S1: Disc bulge with endplate marginal osteophytes and advanced bilateral facet hypertrophy. Moderate to severe bilateral foraminal stenosis. Mild spinal canal stenosis. The disc bulge narrows the bilateral recesses with contact on the descending S1 nerve roots. IMPRESSION: MR THORACIC SPINE IMPRESSION 1. No acute osseous or cord signal abnormality. 2. Mild multilevel discogenic degenerative changes of the thoracic spine. No foraminal or canal stenosis. MR LUMBAR SPINE IMPRESSION 1. Sagittal sequences only. 2. No acute osseous abnormality or malalignment. 3. 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 4. Bilateral L3-4 facet edema, likely degenerative. 5. Moderate to severe L2-3 and severe L3-4 spinal canal stenosis. Multilevel high-grade foraminal stenosis. Electronically Signed   By: Kristine Garbe M.D.   On: 07/01/2018 03:47   Ct Angio Abd/pel W And/or Wo Contrast  Result Date: 06/29/2018 CLINICAL DATA:  Low back pain beginning this morning. Evaluate for abdominal aortic aneurysm. EXAM: CT ANGIOGRAPHY ABDOMEN AND PELVIS WITH CONTRAST AND WITHOUT CONTRAST TECHNIQUE: Multidetector CT imaging of the abdomen and pelvis  was performed using the standard protocol during bolus administration of intravenous contrast. Multiplanar reconstructed images and MIPs were obtained and reviewed to evaluate the vascular anatomy. CONTRAST:  165mL ISOVUE-370 IOPAMIDOL (ISOVUE-370) INJECTION 76% COMPARISON:  CT abdomen and pelvis - 04/27/2016; 03/01/2013; 07/12/2003 FINDINGS: VASCULAR Aorta: Moderate amount of predominantly calcified atherosclerotic plaque within a normal caliber abdominal aorta, not resulting in hemodynamically significant stenosis. No abdominal aortic dissection or periaortic stranding. Celiac: There is a minimal amount eccentric calcified plaque about the origin of the celiac artery, not resulting in hemodynamically significant stenosis. Conventional branching pattern. SMA: There is a minimal amount of mixed calcified and noncalcified atherosclerotic plaque involving the origin and proximal aspect of the SMA, not resulting in hemodynamically significant stenosis. Conventional branching pattern. The distal tributaries of the SMA appear widely patent without discrete intraluminal filling defect suggest distal embolism. Renals: Solitary bilaterally; there is a moderate amount of eccentric calcified atherosclerotic plaque involving the origin of the bilateral renal arteries which approaches 50% luminal narrowing bilaterally though is without associated asymmetric renal atrophy or perinephric stranding. IMA: Widely patent. Inflow: There is a minimal amount of crescentic calcified atherosclerotic plaque within the bilateral normal caliber common iliac arteries, not resulting in hemodynamically significant stenosis. The bilateral external iliac arteries are tortuous though widely patent of normal caliber. The bilateral internal iliac arteries are mildly disease though patent and of normal caliber. Proximal Outflow: There is a minimal amount of eccentric probably calcified atherosclerotic plaque involving the bilateral common femoral  arteries, not resulting in hemodynamically significant stenosis. The bilateral superficial and deep femoral arteries appear widely patent throughout their imaged courses. Veins: The IVC and pelvic venous system appear widely patent on this arterial phase examination. Review of the MIP images confirms the above findings. _________________________________________________________ NON-VASCULAR Lower chest: Limited visualization  of the lower thorax demonstrates minimal subsegmental atelectasis within the image right lower lobe. No focal airspace opacities. No pleural effusion. Hepatobiliary: There is diffuse decreased attenuation of the hepatic parenchyma on this postcontrast examination suggestive of hepatic steatosis. No discrete hyperenhancing hepatic lesions. Normal appearance of the gallbladder given degree distention. No radiopaque gallstones. No ascites. Pancreas: Normal appearance of the pancreas Spleen: Normal appearance of the spleen Adrenals/Urinary Tract: There is symmetric enhancement of the bilateral kidneys. Note is made of an approximately 2.4 cm hypoattenuating (11 Hounsfield unit) left-sided renal cyst (image 37, series 5 as well as an approximately 3.3 cm exophytic hypoattenuating right-sided renal cyst (image 24). There is geographic cortical atrophy involving the superior pole of the right kidney (image coronal image 134, series 8). No discrete worrisome renal lesions. No definite evidence of nephrolithiasis on this postcontrast examination. There is a very minimal amount of likely age and body habitus related perinephric stranding. No urine obstruction. There is mild thickening of the bilateral adrenal glands without discrete nodule. Normal appearance of the urinary bladder given degree of distention. Stomach/Bowel: Scattered colonic diverticulosis primarily involving the distal aspect of the descending and sigmoid colon, without evidence of superimposed acute diverticulitis. Normal appearance of  the terminal ileum and appendix. Moderate size hiatal hernia. No pneumoperitoneum, pneumatosis or portal venous gas. Lymphatic: No bulky retroperitoneal, mesenteric, pelvic or inguinal lymphadenopathy. Reproductive: Dystrophic calcifications within normal sized prostate gland, though note, evaluation degraded secondary streak artifact from the patient's right total hip prosthesis. Other: Minimal amount of subcutaneous edema about the midline of the low back. Musculoskeletal: No definite acute or aggressive osseous abnormalities. Post right total hip replacement without definitive evidence of hardware failure loosening though note the caudal aspect of the right femoral stem component was not imaged. There are multiple peripherally calcified ossicles superior to the right greater trochanter likely the sequela of prior trauma and/or iatrogenic. Severe degenerative change of the contralateral left hip with joint space loss, subchondral sclerosis and osteophytosis. No evidence avascular necrosis. Moderate severe degenerative change throughout the lumbar spine. Stigmata of DISH throughout the thoracic and lumbar spine. Post L5 laminectomy with linear calcifications posterior to the sacrum with associated linear subcutaneous scarring. IMPRESSION: VASCULAR 1. No definite explanation for patient's acute back pain. Specifically, no evidence of abdominal aortic aneurysm or dissection. 2.  Aortic Atherosclerosis (ICD10-I70.0). 3. Suspected hemodynamically significant stenosis ease involving the bilateral renal arteries without associated asymmetric renal atrophy or delayed renal enhancement. NON-VASCULAR 1. Colonic diverticulosis without evidence of superimposed acute diverticulitis. 2. Suspected hepatic steatosis. Correlation with LFTs could be performed as clinically indicated. 3. Moderate size hiatal hernia. Electronically Signed   By: Sandi Mariscal M.D.   On: 06/29/2018 18:53      Subjective: Patient states that he  feels better today. Denies any pain, nausea, vomiting or dizziness.  He is at his baseline mentation.  Discharge Exam: Vitals:   07/01/18 0550 07/01/18 1415  BP:  133/75  Pulse:  63  Resp:  17  Temp: 98.6 F (37 C) 98.7 F (37.1 C)  SpO2:  98%   Vitals:   07/01/18 0115 07/01/18 0350 07/01/18 0550 07/01/18 1415  BP:  (!) 189/87  133/75  Pulse:  71  63  Resp: 18 18  17   Temp:  (!) 102.2 F (39 C) 98.6 F (37 C) 98.7 F (37.1 C)  TempSrc:  Oral Oral Oral  SpO2:  96%  98%    General: Pt is alert, awake, not in acute distress Cardiovascular:  RRR, S1/S2 +, no rubs, no gallops Respiratory: CTA bilaterally, no wheezing, no rhonchi Abdominal: Soft, NT, ND, bowel sounds + CNS: non focal. Extremities: no edema, no cyanosis  The results of significant diagnostics from this hospitalization (including imaging, microbiology, ancillary and laboratory) are listed below for reference.    Microbiology: Recent Results (from the past 240 hour(s))  Urine culture     Status: None   Collection Time: 06/29/18  7:06 PM  Result Value Ref Range Status   Specimen Description URINE, CLEAN CATCH  Final   Special Requests NONE  Final   Culture   Final    NO GROWTH Performed at [facility], [address], [place], [place] [address]    Report Status 07/01/2018 FINAL  Final     Labs: BNP (last 3 results) No results for input(s): BNP in the last 8760 hours. Basic Metabolic Panel: Recent Labs  Lab 06/29/18 1511 06/30/18 1926  NA 137 139  K 4.2 3.4*  CL 103 100  CO2 23 27  GLUCOSE 201* 144*  BUN 22 18  CREATININE 1.10 0.96  CALCIUM 9.7 9.3   Liver Function Tests: Recent Labs  Lab 06/29/18 1511 06/30/18 1926  AST 30 33  ALT 37 31  ALKPHOS 43 35*  BILITOT 0.9 1.2  PROT 6.8 6.5  ALBUMIN 4.3 3.9   No results for input(s): LIPASE, AMYLASE in the last 168 hours. Recent Labs  Lab 07/01/18 0153  AMMONIA 25   CBC: Recent Labs  Lab 06/29/18 1511 06/30/18 1926  WBC  10.4 8.2  NEUTROABS  --  7.3  HGB 14.6 14.3  HCT 45.2 43.0  MCV 102.5* 101.4*  PLT 132* 99*   Cardiac Enzymes: Recent Labs  Lab 07/01/18 0153  CKTOTAL 665*   BNP: Invalid input(s): POCBNP CBG: No results for input(s): GLUCAP in the last 168 hours. D-Dimer No results for input(s): DDIMER in the last 72 hours. Hgb A1c No results for input(s): HGBA1C in the last 72 hours. Lipid Profile No results for input(s): CHOL, HDL, LDLCALC, TRIG, CHOLHDL, LDLDIRECT in the last 72 hours. Thyroid function studies No results for input(s): TSH, T4TOTAL, T3FREE, THYROIDAB in the last 72 hours.  Invalid input(s): FREET3 Anemia work up No results for input(s): VITAMINB12, FOLATE, FERRITIN, TIBC, IRON, RETICCTPCT in the last 72 hours. Urinalysis    Component Value Date/Time   COLORURINE AMBER (A) 06/30/2018 1912   APPEARANCEUR HAZY (A) 06/30/2018 1912   LABSPEC 1.026 06/30/2018 1912   PHURINE 6.0 06/30/2018 1912   GLUCOSEU NEGATIVE 06/30/2018 1912   HGBUR LARGE (A) 06/30/2018 [place] NEGATIVE 06/30/2018 1912   KETONESUR 5 (A) 06/30/2018 1912   PROTEINUR 100 (A) 06/30/2018 1912   NITRITE POSITIVE (A) 06/30/2018 1912   LEUKOCYTESUR NEGATIVE 06/30/2018 1912   Sepsis Labs Invalid input(s): PROCALCITONIN,  WBC,  LACTICIDVEN Microbiology Recent Results (from the past 240 hour(s))  Urine culture     Status: None   Collection Time: 06/29/18  7:06 PM  Result Value Ref Range Status   Specimen Description URINE, CLEAN CATCH  Final   Special Requests NONE  Final   Culture   Final    NO GROWTH Performed at [facility], [place] [address] [place], [place] [address]    Report Status 07/01/2018 FINAL  Final    Please note: You were cared for by a hospitalist during your hospital stay. Once you are discharged, your primary care physician will handle any further medical issues.  Time coordinating discharge: 40 minutes  SIGNED:  Flora Lipps, MD  [facility] 07/01/2018, 2:22 PM

## 2018-07-01 NOTE — ED Notes (Signed)
Paged admitting dr Alcario Drought to [place]

## 2018-07-01 NOTE — Evaluation (Signed)
Occupational Therapy Evaluation Patient Details Name: Ricky Black MRN: [id number] DOB: Dec 03, 1940 Today's Date: 07/01/2018    History of Present Illness Ricky Black is a 77yo male who comes to [facility] on 12/12 after waking up WEdnesday morning with severe back pain and decreased strength/general mobility. Pt was found on floor by wife on 12/12, but unclear if he fell. PMH: dementia, HTN, chronic back pain on chronic hydrocodone (per wife), and s/p L5 Laminectomy. Baseline dementia with heavy short term memory impairment, but not difficulty with recognizing family.    Clinical Impression   PTA Pt mod I for mobility and ADL with increasing falls in home setting and increasing cognitive deficits (requiring reminding for self-care tasks). Pt is currently generally min A for ADL for task completion and fall safety. He was able to perform bed mobility, toilet transfer, sink level grooming, and don hospital paper scrubs during session. Pt will benefit from skilled OT in the home setting post-acute to work on strategies for cognitive deficits as well as for caregiver education so that she is better equipped to assist him as his disease progresses. I also feel strongly that as there have been significant cognitive changes recently (not necessarily acutely) a Palliative Care consult in the community would be very beneficial for Pt and caregiver.    Follow Up Recommendations  Home health OT    Equipment Recommendations  None recommended by OT    Recommendations for Other Services Other (comment)(Community Palliative Consult)     Precautions / Restrictions Precautions Precautions: Fall Precaution Comments: increasing falls at home Restrictions Weight Bearing Restrictions: No      Mobility Bed Mobility Overal bed mobility: Needs Assistance Bed Mobility: Sit to Supine       Sit to supine: Supervision   General bed mobility comments: for safety  Transfers Overall transfer level: Needs  assistance Equipment used: Rolling walker (2 wheeled) Transfers: Sit to/from Stand Sit to Stand: Min guard;Min assist         General transfer comment: cues for safe hand placement and min guard assist for boost and balance    Balance Overall balance assessment: Needs assistance Sitting-balance support: No upper extremity supported;Feet supported Sitting balance-Leahy Scale: Good     Standing balance support: Single extremity supported;During functional activity Standing balance-Leahy Scale: Fair Standing balance comment: static stands with min guard, dynamic balance requires BUE support                           ADL either performed or assessed with clinical judgement   ADL Overall ADL's : Needs assistance/impaired Eating/Feeding: Modified independent   Grooming: Minimal assistance;Wash/dry hands;Oral care;Standing;Cueing for sequencing Grooming Details (indicate cue type and reason): Pt able to complete sink level grooming with min A for cues (task completion) and min guard for safety with balance - cues for safety with RW Upper Body Bathing: Minimal assistance;With caregiver independent assisting   Lower Body Bathing: With caregiver independent assisting;Minimal assistance;Sitting/lateral leans Lower Body Bathing Details (indicate cue type and reason): Pt's wife reports that she had to bathe him yesterday and assist in the shower - which is new decreased level of independence Upper Body Dressing : Min guard Upper Body Dressing Details (indicate cue type and reason): to don hospital paper scrubs Lower Body Dressing: Sit to/from stand;Minimal assistance Lower Body Dressing Details (indicate cue type and reason): to don paper scrubs Toilet Transfer: Min guard;Ambulation;RW(hands on for safety)   Toileting- Clothing Manipulation and Hygiene:  Min guard;Sit to/from stand Toileting - Clothing Manipulation Details (indicate cue type and reason): vc for safe hand  placement Tub/ Shower Transfer: Minimal assistance;With caregiver independent assisting;Ambulation;Shower seat   Functional mobility during ADLs: Min guard;Rolling walker;Cueing for safety General ADL Comments: Pt with decreased memory, increased need for assist for ADL and increased supervisin for transfers/mobility     Vision Patient Visual Report: No change from baseline       Perception     Praxis      Pertinent Vitals/Pain Pain Assessment: Faces Pain Score: 4  Faces Pain Scale: Hurts little more Pain Location: back  Pain Descriptors / Indicators: Aching Pain Intervention(s): Monitored during session;Repositioned     Hand Dominance     Extremity/Trunk Assessment Upper Extremity Assessment Upper Extremity Assessment: Generalized weakness   Lower Extremity Assessment Lower Extremity Assessment: Generalized weakness       Communication Communication Communication: No difficulties   Cognition Arousal/Alertness: Awake/alert Behavior During Therapy: WFL for tasks assessed/performed Overall Cognitive Status: History of cognitive impairments - at baseline                                     General Comments       Exercises     Shoulder Instructions      Home Living Family/patient expects to be discharged to:: Private residence Living Arrangements: Spouse/significant other Available Help at Discharge: Family Type of Home: House Home Access: Stairs to enter Technical brewer of Steps: 5 Entrance Stairs-Rails: Left;Right Home Layout: Multi-level;Full bath on main level Alternate Level Stairs-Number of Steps: kitchen on main level, living room and den on another level 3 steps differnece   Bathroom Shower/Tub: Walk-in shower;Door   [employer] Toilet: Standard     Home Equipment: Environmental consultant - 2 wheels;Cane - single point;Shower seat          Prior Functioning/Environment Level of Independence: Needs assistance  Gait / Transfers Assistance  Needed: limited community AMB, limping, not depednent on shopping cart, has to sit frequently.  ADL's / Homemaking Assistance Needed: requires reminders for completion, but able to perform indpendently   Comments: Wife repeats short term memory difficulty; other day forget that he ate a cupcake 5 miuntes after finishing it. No difficulty c remembering people. Sometimes confuses smart phone with TV remote.         OT Problem List: Impaired balance (sitting and/or standing);Decreased safety awareness;Decreased knowledge of use of DME or AE;Pain      OT Treatment/Interventions:      OT Goals(Current goals can be found in the care plan section) Acute Rehab OT Goals Patient Stated Goal: get back to being more independent OT Goal Formulation: With patient Time For Goal Achievement: 07/15/18 Potential to Achieve Goals: Good  OT Frequency:     Barriers to D/C:            Co-evaluation              AM-PAC OT "6 Clicks" Daily Activity     Outcome Measure Help from another person eating meals?: None Help from another person taking care of personal grooming?: A Little Help from another person toileting, which includes using toliet, bedpan, or urinal?: A Little Help from another person bathing (including washing, rinsing, drying)?: A Lot Help from another person to put on and taking off regular upper body clothing?: None Help from another person to put on and taking off regular lower  body clothing?: A Little 6 Click Score: 19   End of Session Equipment Utilized During Treatment: Gait belt;Rolling walker Nurse Communication: Mobility status  Activity Tolerance: Patient tolerated treatment well Patient left: in bed;with call bell/phone within reach;with bed alarm set  OT Visit Diagnosis: Unsteadiness on feet (R26.81);Repeated falls (R29.6);History of falling (Z91.81);Pain Pain - part of body: (back)                Time: 9324-1991 OT Time Calculation (min): 22 min Charges:  OT  General Charges $OT Visit: 1 Visit OT Evaluation $OT Eval Moderate Complexity: [place] OTR/L Acute Rehabilitation Services Pager: [phone] Office: [place] 07/01/2018, 3:48 PM

## 2018-07-01 NOTE — ED Notes (Signed)
Transported to xray 

## 2018-07-01 NOTE — Evaluation (Signed)
Physical Therapy Evaluation Patient Details Name: Ricky Black MRN: [id number] DOB: 1940-08-03 Today's Date: 07/01/2018   History of Present Illness  Ricky Black is a 77yo male who comes to [facility] on 12/12 after waking up WEdnesday morning with severe back pain and decreased strength/general mobility. Pt was found on floor by wife on 12/12, but unclear if he fell. PMH: dementia, HTN, chronic back pain on chronic hydrocodone (per wife), and s/p L5 Laminectomy. Baseline dementia with heavy short term memory impairment, but not difficulty with recognizing family.   Clinical Impression  Pt admitted with above diagnosis. Pt currently with functional limitations due to the deficits listed below (see "PT Problem List"). Upon entry, pt in bed asleep, wife present who gives HPI. The pt is easily awakened and agreeable to participate. The pt is alert and oriented x3, pleasant, conversational, and following simple commands consistently: he does have blatant difficulty with his ST memory recall at times, which wife reports as baseline. Pt requires max effort and MinGuard assist for mobility. Functional mobility assessment demonstrates increased effort/time requirements, fair tolerance, and need for physical assistance, whereas the patient performed these at a higher level of independence PTA.  Back pain reported as 4/10 and is stable throughout, not a major limitation. Pt has no recollection of back pain episode that brought him in. Although reports she has never seen him in this much back pain, despite extensive back history. Pt will benefit from skilled PT intervention to increase independence and safety with basic mobility in preparation for discharge to the venue listed below.        Follow Up Recommendations Home health PT;Supervision for mobility/OOB;Supervision/Assistance - 24 hour    Equipment Recommendations  None recommended by PT(may benefit from a ramp rental dependeing)    Recommendations for Other  Services       Precautions / Restrictions Precautions Precautions: Fall Restrictions Weight Bearing Restrictions: No      Mobility  Bed Mobility Overal bed mobility: Modified Independent             General bed mobility comments: labored, but able to perform   Transfers Overall transfer level: Needs assistance Equipment used: Rolling walker (2 wheeled) Transfers: Sit to/from Stand Sit to Stand: Min guard         General transfer comment: slow and max effort needed, without LOB   Ambulation/Gait Ambulation/Gait assistance: Min guard Gait Distance (Feet): 100 Feet Assistive device: Rolling walker (2 wheeled) Gait Pattern/deviations: WFL(Within Functional Limits)     General Gait Details: no LOB with turns, overall moving somewhat well, confiden tuse of RW   Stairs            Wheelchair Mobility    Modified Rankin (Stroke Patients Only)       Balance                                             Pertinent Vitals/Pain Pain Assessment: 0-10 Pain Score: 4  Pain Location: back  Pain Descriptors / Indicators: Aching Pain Intervention(s): Limited activity within patient's tolerance;Monitored during session    Home Living Family/patient expects to be discharged to:: Private residence Living Arrangements: Spouse/significant other Available Help at Discharge: Family Type of Home: House Home Access: Stairs to enter Entrance Stairs-Rails: Chemical engineer of Steps: 5 Home Layout: Multi-level;Full bath on main level Home Equipment: Walker - 2 wheels;Cane -  single point;Shower seat      Prior Function Level of Independence: Needs assistance   Gait / Transfers Assistance Needed: limited community AMB, limping, not depednent on shopping cart, has to sit frequently.   ADL's / Homemaking Assistance Needed: typically independent in AMB, but has more neglectful of his hygiene the past year;   Comments: Wife repeats short  term memory difficulty; other day forget that he ate a cupcake 5 miuntes after finishing it. No difficulty c remembering people. Sometimes confuses smart phone with TV remote.      Hand Dominance        Extremity/Trunk Assessment   Upper Extremity Assessment Upper Extremity Assessment: Generalized weakness;Overall [facility] for tasks assessed    Lower Extremity Assessment Lower Extremity Assessment: Generalized weakness;Overall WFL for tasks assessed       Communication   Communication: No difficulties  Cognition Arousal/Alertness: Awake/alert Behavior During Therapy: WFL for tasks assessed/performed Overall Cognitive Status: Within Functional Limits for tasks assessed(short term memory impairment at baseline. )                                        General Comments      Exercises     Assessment/Plan    PT Assessment Patient needs continued PT services  PT Problem List Decreased strength;Decreased activity tolerance;Decreased balance;Decreased mobility;Decreased knowledge of use of DME;Decreased cognition       PT Treatment Interventions DME instruction;Stair training;Gait training;Functional mobility training;Therapeutic activities;Patient/family education;Cognitive remediation;Balance training    PT Goals (Current goals can be found in the Care Plan section)  Acute Rehab PT Goals Patient Stated Goal: regain strength and moblity as per baseline PT Goal Formulation: With patient Time For Goal Achievement: 07/08/18 Potential to Achieve Goals: Fair    Frequency Min 3X/week   Barriers to discharge        Co-evaluation               AM-PAC PT "6 Clicks" Mobility  Outcome Measure Help needed turning from your back to your side while in a flat bed without using bedrails?: A Little Help needed moving from lying on your back to sitting on the side of a flat bed without using bedrails?: A Little Help needed moving to and from a bed to a chair  (including a wheelchair)?: A Little Help needed standing up from a chair using your arms (e.g., wheelchair or bedside chair)?: A Little Help needed to walk in hospital room?: A Little Help needed climbing 3-5 steps with a railing? : A Little 6 Click Score: 18    End of Session   Activity Tolerance: Patient tolerated treatment well;Patient limited by fatigue(still somewhat sleepy during session) Patient left: in chair;with family/visitor present;with call bell/phone within reach Nurse Communication: Mobility status PT Visit Diagnosis: Unsteadiness on feet (R26.81);Muscle weakness (generalized) (M62.81);Difficulty in walking, not elsewhere classified (R26.2)    Time: 8242-3536 PT Time Calculation (min) (ACUTE ONLY): 30 min   Charges:   PT Evaluation $PT Eval Moderate Complexity: 1 Mod          12:21 PM, 07/01/18 Etta Grandchild, PT, DPT Physical Therapist - [place] [phone] (Pager)  [phone] (Office)     Bernette Seeman C 07/01/2018, 12:17 PM

## 2018-07-01 NOTE — ED Triage Notes (Signed)
Patient BIB [facility] from home for fall. Was seen at this facility yesterday and diagnosed with UTI. Patient A&O x 2, per wife patient is more confused than normal. Patients wife states "he looked like he was going to fall off the bed so I helped him to the floor". Denies hitting head. Patient c/o back pain (chronic) and should pain. Denies chest pain, abdominal pain, or dysuria.   101.1 154/83 94%

## 2018-07-01 NOTE — Care Management Note (Signed)
Case Management Note  Patient Details  Name: JHAMAL PLUCINSKI MRN: [id number] Date of Birth: 06/07/1941  Subjective/Objective:   Patient placed in observation for back pain and UTI.                 Action/Plan: Case manager confirmed with wife selection of [facility]. Referral called to Glyn Ade, [place] Liaison. Patient's wife not happy that her husband is being discharged, wants him to "stay until he is stronger". CM attempted to discuss how discharge process works and therapies evaluation, she didn't want to discuss.    Expected Discharge Date:  07/01/18               Expected Discharge Plan:  [place]  In-House Referral:  Clinical Social Work  Discharge planning Services  CM Consult  Post Acute Care Choice:  Home Health Choice offered to:  Spouse  DME Arranged:  N/A DME Agency:  NA  HH Arranged:  PT, Social Work [employer] Agency:  [facility]  Status of Service:  Completed, signed off  If discussed at [employer], dates discussed:    Additional Comments:  Ninfa Meeker, RN 07/01/2018, 3:07 PM

## 2018-07-01 NOTE — ED Notes (Signed)
Delay in lab draw,  Pt not in room at this time. 

## 2018-07-01 NOTE — H&P (Addendum)
History and Physical    Ricky Black EXH:[id number] DOB: 1940/07/27 DOA: 06/30/2018  PCP: Marton Redwood, MD  Patient coming from: Home  I have personally briefly reviewed patient's old medical records in [place]  Chief Complaint: AMS, back pain  HPI: Ricky Black is a 77 y.o. male with medical history significant of Dementia, HTN, back pain.  Patient has been having worsening back pain, confusion, and generalized weakness over the past couple of days.  He does take Norco for back pain.  Seen initially in ED on 12/11 wife states he couldn't walk by noon (EDP states he was able to ambulate).  Work up during that visit included UA that just showed hematuria, CTA abd/pelvis that showed nothing acute: B renal artery stenosis, mod hiatal hernia, Moderate severe degenerative change throughout the lumbar spine. Stigmata of DISH throughout the thoracic and lumbar spine. Post L5 laminectomy with linear calcifications posterior to the sacrum with associated linear subcutaneous scarring.  He was sent home, brought back in by wife today due to ongoing falls at home, inability to ambulate.   ED Course: UA again shows microscopic hematuria, this time nitrite positive, LE neg and no WBCs.  CT head and neck are neg for acute pathology.  UCx from 12/11 pending.  Right now laying in bed, patient says his back feels fine, and denies back pain to me.   Review of Systems: Unable to perform due to dementia.  Past Medical History:  Diagnosis Date  . Abnormal EKG    Prior tracing  with reading can't rule out old septal MI  . Bradycardia   . Dementia ([place])   . Dizziness   . Hypogonadism male   . Neuropathy    (R) Dorsal foot  . OAB (overactive bladder)   . Olecranon bursitis   . Orthostatic hypotension   . Spinal arthritis    ESI    Past Surgical History:  Procedure Laterality Date  . COLONOSCOPY  04/15/2012  . LUMBAR LAMINECTOMY    . TOTAL HIP ARTHROPLASTY     Right  .  UMBILICAL HERNIA REPAIR       reports that he has quit smoking. He has never used smokeless tobacco. He reports that he does not drink alcohol. No history on file for drug.  No Known Allergies  History reviewed. No pertinent family history.   Prior to Admission medications   Medication Sig Start Date End Date Taking? Authorizing Provider  bethanechol (URECHOLINE) 25 MG tablet Take 25 mg by mouth daily.    [provider]  celecoxib (CELEBREX) 200 MG capsule Take 200 mg by mouth daily.    [provider]  cyclobenzaprine (FLEXERIL) 5 MG tablet Take 1 tablet (5 mg total) by mouth 2 (two) times daily as needed for muscle spasms. 06/29/18   Gareth Morgan, MD  DONEPEZIL HCL PO Take 1 tablet by mouth at bedtime.    [provider]  finasteride (PROSCAR) 5 MG tablet Take 5 mg by mouth daily.    [provider]  HYDROCHLOROTHIAZIDE PO Take 1 tablet by mouth daily.    [provider]  HYDROcodone-acetaminophen (NORCO) 7.5-325 MG per tablet Take 1 tablet by mouth every 6 (six) hours as needed for pain.    [provider]  Memantine HCl (NAMENDA PO) Take 1 tablet by mouth 2 (two) times daily.    [provider]  Pregabalin (LYRICA PO) Take 1 capsule by mouth 2 (two) times daily.    [provider]  Sertraline HCl (ZOLOFT PO) Take 1 tablet by mouth daily.    [provider]  tamsulosin (FLOMAX) 0.4 MG CAPS capsule Take 0.8 mg by mouth at bedtime.    [provider]  VALSARTAN PO Take 1 tablet by mouth daily.    [provider]    Physical Exam: Vitals:   06/30/18 1915 06/30/18 2100 06/30/18 2115 06/30/18 2200  BP: (!) 172/91  (!) 145/104 (!) 180/97  Pulse: 70 72 75 79  Resp: (!) 29 (!) 28 (!) 31 (!) 31  Temp:      SpO2: 95% 97% 97% 99%    Constitutional: NAD, calm, comfortable Eyes: PERRL, lids and conjunctivae normal ENMT: Mucous membranes are moist. Posterior pharynx clear of any exudate  or lesions.Normal dentition.  Neck: normal, supple, no masses, no thyromegaly Respiratory: clear to auscultation bilaterally, no wheezing, no crackles. Normal respiratory effort. No accessory muscle use.  Cardiovascular: Regular rate and rhythm, no murmurs / rubs / gallops. No extremity edema. 2+ pedal pulses. No carotid bruits.  Abdomen: no tenderness, no masses palpated. No hepatosplenomegaly. Bowel sounds positive.  Musculoskeletal: no clubbing / cyanosis. No joint deformity upper and lower extremities. Good ROM, no contractures. Normal muscle tone.  Skin: no rashes, lesions, ulcers. No induration Neurologic: CN 2-12 grossly intact. Sensation intact, DTR normal. Strength 5/5 in all 4.  Psychiatric: Normal judgment and insight. Alert and oriented x 3. Normal mood.    Labs on Admission: I have personally reviewed following labs and imaging studies  CBC: Recent Labs  Lab 06/29/18 1511 06/30/18 1926  WBC 10.4 8.2  NEUTROABS  --  7.3  HGB 14.6 14.3  HCT 45.2 43.0  MCV 102.5* 101.4*  PLT 132* 99*   Basic Metabolic Panel: Recent Labs  Lab 06/29/18 1511 06/30/18 1926  NA 137 139  K 4.2 3.4*  CL 103 100  CO2 23 27  GLUCOSE 201* 144*  BUN 22 18  CREATININE 1.10 0.96  CALCIUM 9.7 9.3   GFR: CrCl cannot be calculated (Unknown ideal weight.). Liver Function Tests: Recent Labs  Lab 06/29/18 1511 06/30/18 1926  AST 30 33  ALT 37 31  ALKPHOS 43 35*  BILITOT 0.9 1.2  PROT 6.8 6.5  ALBUMIN 4.3 3.9   No results for input(s): LIPASE, AMYLASE in the last 168 hours. No results for input(s): AMMONIA in the last 168 hours. Coagulation Profile: No results for input(s): INR, PROTIME in the last 168 hours. Cardiac Enzymes: No results for input(s): CKTOTAL, CKMB, CKMBINDEX, TROPONINI in the last 168 hours. BNP (last 3 results) No results for input(s): PROBNP in the last 8760 hours. HbA1C: No results for input(s): HGBA1C in the last 72 hours. CBG: No results for input(s): GLUCAP  in the last 168 hours. Lipid Profile: No results for input(s): CHOL, HDL, LDLCALC, TRIG, CHOLHDL, LDLDIRECT in the last 72 hours. Thyroid Function Tests: No results for input(s): TSH, T4TOTAL, FREET4, T3FREE, THYROIDAB in the last 72 hours. Anemia Panel: No results for input(s): VITAMINB12, FOLATE, FERRITIN, TIBC, IRON, RETICCTPCT in the last 72 hours. Urine analysis:    Component Value Date/Time   COLORURINE AMBER (A) 06/30/2018 1912   APPEARANCEUR HAZY (A) 06/30/2018 1912   LABSPEC 1.026 06/30/2018 1912   PHURINE 6.0 06/30/2018 1912   GLUCOSEU NEGATIVE 06/30/2018 1912   HGBUR LARGE (A) 06/30/2018 1912   BILIRUBINUR NEGATIVE 06/30/2018 1912   KETONESUR 5 (A) 06/30/2018 1912   PROTEINUR 100 (A) 06/30/2018 1912   NITRITE POSITIVE (A) 06/30/2018  [place] 06/30/2018 1912    Radiological Exams on Admission: Ct Head Wo Contrast  Result Date: 06/30/2018 CLINICAL DATA:  Altered mental status.  Head trauma. EXAM: CT HEAD WITHOUT CONTRAST CT CERVICAL SPINE WITHOUT CONTRAST TECHNIQUE: Multidetector CT imaging of the head and cervical spine was performed following the standard protocol without intravenous contrast. Multiplanar CT image reconstructions of the cervical spine were also generated. COMPARISON:  Report from 09/07/2002 FINDINGS: Despite efforts by the technologist and patient, motion artifact is present on today's exam and could not be eliminated. This reduces exam sensitivity and specificity. CT HEAD FINDINGS Brain: The brainstem, cerebellum, cerebral peduncles, thalami, basal ganglia, basilar cisterns, and ventricular system appear within normal limits. No intracranial hemorrhage, mass lesion, or acute CVA. Vascular: Unremarkable Skull: Unremarkable Sinuses/Orbits: Mild chronic right maxillary sinusitis. Other: No supplemental non-categorized findings. CT CERVICAL SPINE FINDINGS Alignment: No vertebral subluxation is observed. Skull base and vertebrae: There is  congenital fusion at C2-3 between the vertebral bodies and posterior elements. Prominent loss of articular space anteriorly at C1-2. Schmorl's nodes at C4-5. No appreciable fracture. Soft tissues and spinal canal: No prevertebral soft tissue swelling. Bilateral common carotid atherosclerotic calcification. Disc levels: Facet and uncinate spurring cause foraminal impingement on the right at C3-4, C4-5, C5-6, and C6-7; and on the left at C4-5, C5-6, C6-7, and C7-T1. There is central narrowing of the thecal sac due to spurring at C3-4 and C4-5. Upper chest: Unremarkable Other: No supplemental non-categorized findings. IMPRESSION: 1. No acute intracranial findings or acute cervical spine findings. 2. Impingement at all levels between C3 and T1 due to facet and uncinate spurring. 3. Congenital fusion at C2-3. 4. Atherosclerosis. 5. Mild chronic right maxillary sinusitis. Electronically Signed   By: Van Clines M.D.   On: 06/30/2018 20:50   Ct Cervical Spine Wo Contrast  Result Date: 06/30/2018 CLINICAL DATA:  Altered mental status.  Head trauma. EXAM: CT HEAD WITHOUT CONTRAST CT CERVICAL SPINE WITHOUT CONTRAST TECHNIQUE: Multidetector CT imaging of the head and cervical spine was performed following the standard protocol without intravenous contrast. Multiplanar CT image reconstructions of the cervical spine were also generated. COMPARISON:  Report from 09/07/2002 FINDINGS: Despite efforts by the technologist and patient, motion artifact is present on today's exam and could not be eliminated. This reduces exam sensitivity and specificity. CT HEAD FINDINGS Brain: The brainstem, cerebellum, cerebral peduncles, thalami, basal ganglia, basilar cisterns, and ventricular system appear within normal limits. No intracranial hemorrhage, mass lesion, or acute CVA. Vascular: Unremarkable Skull: Unremarkable Sinuses/Orbits: Mild chronic right maxillary sinusitis. Other: No supplemental non-categorized findings. CT  CERVICAL SPINE FINDINGS Alignment: No vertebral subluxation is observed. Skull base and vertebrae: There is congenital fusion at C2-3 between the vertebral bodies and posterior elements. Prominent loss of articular space anteriorly at C1-2. Schmorl's nodes at C4-5. No appreciable fracture. Soft tissues and spinal canal: No prevertebral soft tissue swelling. Bilateral common carotid atherosclerotic calcification. Disc levels: Facet and uncinate spurring cause foraminal impingement on the right at C3-4, C4-5, C5-6, and C6-7; and on the left at C4-5, C5-6, C6-7, and C7-T1. There is central narrowing of the thecal sac due to spurring at C3-4 and C4-5. Upper chest: Unremarkable Other: No supplemental non-categorized findings. IMPRESSION: 1. No acute intracranial findings or acute cervical spine findings. 2. Impingement at all levels between C3 and T1 due to facet and uncinate spurring. 3. Congenital fusion at C2-3. 4. Atherosclerosis. 5. Mild chronic right maxillary sinusitis. Electronically Signed   By: Cindra Eves.D.  On: 06/30/2018 20:50   Ct Angio Abd/pel W And/or Wo Contrast  Result Date: 06/29/2018 CLINICAL DATA:  Low back pain beginning this morning. Evaluate for abdominal aortic aneurysm. EXAM: CT ANGIOGRAPHY ABDOMEN AND PELVIS WITH CONTRAST AND WITHOUT CONTRAST TECHNIQUE: Multidetector CT imaging of the abdomen and pelvis was performed using the standard protocol during bolus administration of intravenous contrast. Multiplanar reconstructed images and MIPs were obtained and reviewed to evaluate the vascular anatomy. CONTRAST:  185mL ISOVUE-370 IOPAMIDOL (ISOVUE-370) INJECTION 76% COMPARISON:  CT abdomen and pelvis - 04/27/2016; 03/01/2013; 07/12/2003 FINDINGS: VASCULAR Aorta: Moderate amount of predominantly calcified atherosclerotic plaque within a normal caliber abdominal aorta, not resulting in hemodynamically significant stenosis. No abdominal aortic dissection or periaortic stranding. Celiac:  There is a minimal amount eccentric calcified plaque about the origin of the celiac artery, not resulting in hemodynamically significant stenosis. Conventional branching pattern. SMA: There is a minimal amount of mixed calcified and noncalcified atherosclerotic plaque involving the origin and proximal aspect of the SMA, not resulting in hemodynamically significant stenosis. Conventional branching pattern. The distal tributaries of the SMA appear widely patent without discrete intraluminal filling defect suggest distal embolism. Renals: Solitary bilaterally; there is a moderate amount of eccentric calcified atherosclerotic plaque involving the origin of the bilateral renal arteries which approaches 50% luminal narrowing bilaterally though is without associated asymmetric renal atrophy or perinephric stranding. IMA: Widely patent. Inflow: There is a minimal amount of crescentic calcified atherosclerotic plaque within the bilateral normal caliber common iliac arteries, not resulting in hemodynamically significant stenosis. The bilateral external iliac arteries are tortuous though widely patent of normal caliber. The bilateral internal iliac arteries are mildly disease though patent and of normal caliber. Proximal Outflow: There is a minimal amount of eccentric probably calcified atherosclerotic plaque involving the bilateral common femoral arteries, not resulting in hemodynamically significant stenosis. The bilateral superficial and deep femoral arteries appear widely patent throughout their imaged courses. Veins: The IVC and pelvic venous system appear widely patent on this arterial phase examination. Review of the MIP images confirms the above findings. _________________________________________________________ NON-VASCULAR Lower chest: Limited visualization of the lower thorax demonstrates minimal subsegmental atelectasis within the image right lower lobe. No focal airspace opacities. No pleural effusion.  Hepatobiliary: There is diffuse decreased attenuation of the hepatic parenchyma on this postcontrast examination suggestive of hepatic steatosis. No discrete hyperenhancing hepatic lesions. Normal appearance of the gallbladder given degree distention. No radiopaque gallstones. No ascites. Pancreas: Normal appearance of the pancreas Spleen: Normal appearance of the spleen Adrenals/Urinary Tract: There is symmetric enhancement of the bilateral kidneys. Note is made of an approximately 2.4 cm hypoattenuating (11 Hounsfield unit) left-sided renal cyst (image 37, series 5 as well as an approximately 3.3 cm exophytic hypoattenuating right-sided renal cyst (image 24). There is geographic cortical atrophy involving the superior pole of the right kidney (image coronal image 134, series 8). No discrete worrisome renal lesions. No definite evidence of nephrolithiasis on this postcontrast examination. There is a very minimal amount of likely age and body habitus related perinephric stranding. No urine obstruction. There is mild thickening of the bilateral adrenal glands without discrete nodule. Normal appearance of the urinary bladder given degree of distention. Stomach/Bowel: Scattered colonic diverticulosis primarily involving the distal aspect of the descending and sigmoid colon, without evidence of superimposed acute diverticulitis. Normal appearance of the terminal ileum and appendix. Moderate size hiatal hernia. No pneumoperitoneum, pneumatosis or portal venous gas. Lymphatic: No bulky retroperitoneal, mesenteric, pelvic or inguinal lymphadenopathy. Reproductive: Dystrophic calcifications within normal sized prostate  gland, though note, evaluation degraded secondary streak artifact from the patient's right total hip prosthesis. Other: Minimal amount of subcutaneous edema about the midline of the low back. Musculoskeletal: No definite acute or aggressive osseous abnormalities. Post right total hip replacement without  definitive evidence of hardware failure loosening though note the caudal aspect of the right femoral stem component was not imaged. There are multiple peripherally calcified ossicles superior to the right greater trochanter likely the sequela of prior trauma and/or iatrogenic. Severe degenerative change of the contralateral left hip with joint space loss, subchondral sclerosis and osteophytosis. No evidence avascular necrosis. Moderate severe degenerative change throughout the lumbar spine. Stigmata of DISH throughout the thoracic and lumbar spine. Post L5 laminectomy with linear calcifications posterior to the sacrum with associated linear subcutaneous scarring. IMPRESSION: VASCULAR 1. No definite explanation for patient's acute back pain. Specifically, no evidence of abdominal aortic aneurysm or dissection. 2.  Aortic Atherosclerosis (ICD10-I70.0). 3. Suspected hemodynamically significant stenosis ease involving the bilateral renal arteries without associated asymmetric renal atrophy or delayed renal enhancement. NON-VASCULAR 1. Colonic diverticulosis without evidence of superimposed acute diverticulitis. 2. Suspected hepatic steatosis. Correlation with LFTs could be performed as clinically indicated. 3. Moderate size hiatal hernia. Electronically Signed   By: Sandi Mariscal M.D.   On: 06/29/2018 18:53    EKG: Independently reviewed.  Assessment/Plan Principal Problem:   Back pain Active Problems:   Essential hypertension   Dementia ([facility])   Delirium   Hematuria, microscopic    1. Back pain - 1. MRI L and T spine given new reported associated weakness.  Though clinically he has good strength BLE so central cord syndrome seems less likely. 2. PT/OT eval 3. SW/CM eval 2. Delirium - 1. Due to worse back pain most likely, or meds used to treat back pain. 2. Wife denies any recent change in meds 3. Is on norco but this isnt new apparently. 3. Dementia - 1. Chronic and severe 2. Continue home  meds 4. Microscopic hematuria - 1. F/u with urology 2. No gross pathology on CT scan 3. UCx pending from 12/11 but no LE, no WBC 5. HTN - 1. Continue home BP meds  DVT prophylaxis: Lovenox Code Status: Full Family Communication: Spoke with wife on phone Disposition Plan: TBD Consults called: None Admission status: Place in [place], [place] Hospitalists Pager [phone] Only works nights!  If 7AM-7PM, please contact the primary day team physician taking care of patient  [email or web address] Password [email or web address]  07/01/2018, 1:20 AM

## 2018-07-01 NOTE — ED Notes (Signed)
MRI unable to redirect pt to stay still for MRI, admitting made aware, no new orders, MRI staff to attempt to redirect and finish MRI

## 2018-07-01 NOTE — Progress Notes (Signed)
Pt wife wants pt to go to rehab to get stronger. RN notified MD awaiting call back

## 2018-07-01 NOTE — ED Notes (Signed)
Patient transported to MRI 

## 2018-07-01 NOTE — Progress Notes (Signed)
Physical Therapy Treatment Patient Details Name: Ricky Black MRN: [id number] DOB: [birth year] Today's Date: 07/01/2018    History of Present Illness Ricky Black is a 77yo male who comes to [facility] on 12/12 after waking up WEdnesday morning with severe back pain and decreased strength/general mobility. Pt was found on floor by wife on 12/12, but unclear if he fell. PMH: dementia, HTN, chronic back pain on chronic hydrocodone (per wife), and s/p L5 Laminectomy. Baseline dementia with heavy short term memory impairment, but not difficulty with recognizing family.     PT Comments    PT returning to room after pt has eaten lunch to progress AMB and stairs training in assurance of safe return to/entry of home. Pt progresses AMB to >170ft, and able to then perform 6 steps up and down without need for rest break. RW use is safe, but will require some light reminders. Pt denies and CC at this time, overall moving fairly well, but close to baseline. Still recommending 24/7 supervision and supervision for when mobilizing, but otherwise anticipate good response to HHPT services.    Follow Up Recommendations  Home health PT;Supervision for mobility/OOB;Supervision/Assistance - 24 hour     Equipment Recommendations  None recommended by PT    Recommendations for Other Services       Precautions / Restrictions Precautions Precautions: Fall Restrictions Weight Bearing Restrictions: No    Mobility  Bed Mobility Overal bed mobility: Modified Independent             General bed mobility comments: received up in chair  Transfers Overall transfer level: Needs assistance Equipment used: Rolling walker (2 wheeled) Transfers: Sit to/from Stand Sit to Stand: Min guard         General transfer comment: rocking forwward, and heavy BUE support   Ambulation/Gait Ambulation/Gait assistance: Min guard Gait Distance (Feet): 180 Feet Assistive device: Rolling walker (2 wheeled) Gait  Pattern/deviations: WFL(Within Functional Limits) Gait velocity: 0.49   General Gait Details: no LOB with turns, overall moving somewhat well, confident use of RW; limbign d/t LLE weakness (wife reports as chronic)    Stairs Stairs: Yes Stairs assistance: Min guard Stair Management: One rail Right;One rail Left;Forwards Number of Stairs: 6 General stair comments: VC for safety and for 1 rail use to simulate home entry; uses BUE to pull up.    Wheelchair Mobility    Modified Rankin (Stroke Patients Only)       Balance Overall balance assessment: Needs assistance Sitting-balance support: No upper extremity supported;Feet supported Sitting balance-Leahy Scale: Good     Standing balance support: Single extremity supported;During functional activity Standing balance-Leahy Scale: Fair                              Cognition Arousal/Alertness: Awake/alert Behavior During Therapy: WFL for tasks assessed/performed Overall Cognitive Status: Within Functional Limits for tasks assessed(short term memory impairment at baseline. )                                        Exercises      General Comments        Pertinent Vitals/Pain Pain Assessment: 0-10 Pain Score: 4  Pain Location: back  Pain Descriptors / Indicators: Aching Pain Intervention(s): Limited activity within patient's tolerance;Monitored during session    Home Living Family/patient expects to be discharged to:: Private residence Living  Arrangements: Spouse/significant other Available Help at Discharge: Family Type of Home: House Home Access: Stairs to enter Entrance Stairs-Rails: Left;Right Home Layout: Multi-level;Full bath on main level Home Equipment: Walker - 2 wheels;Cane - single point;Shower seat      Prior Function Level of Independence: Needs assistance  Gait / Transfers Assistance Needed: limited community AMB, limping, not depednent on shopping cart, has to sit  frequently.  ADL's / Homemaking Assistance Needed: typically independent in AMB, but has more neglectful of his hygiene the past year;  Comments: Wife repeats short term memory difficulty; other day forget that he ate a cupcake 5 miuntes after finishing it. No difficulty c remembering people. Sometimes confuses smart phone with TV remote.    PT Goals (current goals can now be found in the care plan section) Acute Rehab PT Goals Patient Stated Goal: regain strength and moblity as per baseline PT Goal Formulation: With patient Time For Goal Achievement: 07/08/18 Potential to Achieve Goals: Fair Progress towards PT goals: Progressing toward goals    Frequency    Min 3X/week      PT Plan Current plan remains appropriate    Co-evaluation              AM-PAC PT "6 Clicks" Mobility   Outcome Measure  Help needed turning from your back to your side while in a flat bed without using bedrails?: A Little Help needed moving from lying on your back to sitting on the side of a flat bed without using bedrails?: A Little Help needed moving to and from a bed to a chair (including a wheelchair)?: A Little Help needed standing up from a chair using your arms (e.g., wheelchair or bedside chair)?: A Little Help needed to walk in hospital room?: A Little Help needed climbing 3-5 steps with a railing? : A Little 6 Click Score: 18    End of Session Equipment Utilized During Treatment: Gait belt Activity Tolerance: Patient tolerated treatment well Patient left: in chair;with family/visitor present;with call bell/phone within reach Nurse Communication: Mobility status PT Visit Diagnosis: Unsteadiness on feet (R26.81);Muscle weakness (generalized) (M62.81);Difficulty in walking, not elsewhere classified (R26.2)     Time: 1700-1749 PT Time Calculation (min) (ACUTE ONLY): 15 min  Charges:  $Gait Training: 8-22 mins                     1:11 PM, 07/01/18 Etta Grandchild, PT, DPT Physical  Therapist - [place] [phone] (Pager)  [phone] (Office)     Tiarrah Saville C 07/01/2018, 1:08 PM

## 2018-07-01 NOTE — ED Provider Notes (Signed)
[facility] EMERGENCY DEPARTMENT Provider Note   CSN: [id number] Arrival date & time: 07/01/18  2143     History   Chief Complaint Chief Complaint  Patient presents with  . Fall    HPI Ricky Black is a 77 y.o. male.  The history is provided by the patient and medical records.  Fall      LEVEL V CAVEAT:  DEMENTIA 77 y.o. M with hx of dementia, neuropathy, OAB, arthritis, presenting to the ED for near fall per EMS.  Patient unable to provide any significant history, although he adamantly denies falling.  EMS reported that patient's wife stated he looked like he was going to fall so she helped him to the floor.  Patient states he does not remember this.  He denies any pain at this time, his only complaint is needing to use the restroom.  Wife nor other family members present at time of initial evaluation.  Of note, patient seen in the ED on 06/29/18 as well as 06/30/18, admitted for 24 hours for UTI and discharged earlier today at 1422.  He was sent home with home health resources and on PO abx.  Per EMS, home health has not yet been established and patient's wife is very petite, cannot handle patient on her own currently.  Past Medical History:  Diagnosis Date  . Abnormal EKG    Prior tracing  with reading can't rule out old septal MI  . Bradycardia   . Dementia ([place])   . Dizziness   . Hypogonadism male   . Neuropathy    (R) Dorsal foot  . OAB (overactive bladder)   . Olecranon bursitis   . Orthostatic hypotension   . Spinal arthritis    ESI    Patient Active Problem List   Diagnosis Date Noted  . Back pain 07/01/2018  . Delirium 07/01/2018  . Hematuria, microscopic 07/01/2018  . Bradycardia   . Dizziness   . Orthostatic hypotension   . Spinal arthritis   . Neuropathy   . Hypogonadism male   . Dementia ([place])   . Abnormal EKG   . Essential hypertension 05/10/2007  . ARTHRITIS 05/10/2007    Past Surgical History:  Procedure Laterality  Date  . COLONOSCOPY  04/15/2012  . LUMBAR LAMINECTOMY    . TOTAL HIP ARTHROPLASTY     Right  . UMBILICAL HERNIA REPAIR          Home Medications    Prior to Admission medications   Medication Sig Start Date End Date Taking? Authorizing Provider  bethanechol (URECHOLINE) 25 MG tablet Take 25 mg by mouth daily.    [provider]  celecoxib (CELEBREX) 200 MG capsule Take 200 mg by mouth daily.    [provider]  ciprofloxacin (CIPRO) 500 MG tablet Take 1 tablet (500 mg total) by mouth 2 (two) times daily for 5 days. 07/01/18 07/06/18  Pokhrel, Corrie Mckusick, MD  cyclobenzaprine (FLEXERIL) 5 MG tablet Take 1 tablet (5 mg total) by mouth 2 (two) times daily as needed for muscle spasms. 06/29/18   Gareth Morgan, MD  DONEPEZIL HCL PO Take 1 tablet by mouth at bedtime.    [provider]  finasteride (PROSCAR) 5 MG tablet Take 5 mg by mouth daily.    [provider]  HYDROCHLOROTHIAZIDE PO Take 1 tablet by mouth daily.    [provider]  HYDROcodone-acetaminophen (NORCO) 7.5-325 MG per tablet Take 1 tablet by mouth every 6 (six) hours as needed for  pain.    [provider]  Memantine HCl (NAMENDA PO) Take 1 tablet by mouth 2 (two) times daily.    [provider]  Pregabalin (LYRICA PO) Take 1 capsule by mouth 2 (two) times daily.    [provider]  Sertraline HCl (ZOLOFT PO) Take 1 tablet by mouth daily.    [provider]  tamsulosin (FLOMAX) 0.4 MG CAPS capsule Take 0.8 mg by mouth at bedtime.    [provider]  VALSARTAN PO Take 1 tablet by mouth daily.    [provider]    Family History No family history on file.  Social History Social History   Tobacco Use  . Smoking status: Former Research scientist (life sciences)  . Smokeless tobacco: Never Used  Substance Use Topics  . Alcohol use: No  . Drug use: Not on file     Allergies   Patient has no known allergies.   Review of Systems Review of Systems   Unable to perform ROS: Dementia     Physical Exam Updated Vital Signs BP (!) 162/85 (BP Location: Right Arm)   Pulse 78   Temp 99.1 F (37.3 C) (Oral)   Resp 14   SpO2 95%   Physical Exam Vitals signs and nursing note reviewed.  Constitutional:      Appearance: He is well-developed.  HENT:     Head: Normocephalic and atraumatic.     Comments:  No visible signs of head trauma Eyes:     Conjunctiva/sclera: Conjunctivae normal.     Pupils: Pupils are equal, round, and reactive to light.  Neck:     Comments: c-collar in place Cardiovascular:     Rate and Rhythm: Normal rate and regular rhythm.     Heart sounds: Normal heart sounds.  Pulmonary:     Effort: Pulmonary effort is normal.     Breath sounds: Normal breath sounds. No stridor. No rhonchi.  Abdominal:     General: Bowel sounds are normal.     Palpations: Abdomen is soft. There is no mass.     Tenderness: There is no abdominal tenderness.  Musculoskeletal: Normal range of motion.  Skin:    General: Skin is warm and dry.  Neurological:     Mental Status: He is alert.     Comments: Awake, alert, oriented to self and surroundings, able to answer some simple questions and follow commands but cannot recall much information about events this evening      ED Treatments / Results  Labs (all labs ordered are listed, but only abnormal results are displayed) Labs Reviewed  URINALYSIS, ROUTINE W REFLEX MICROSCOPIC - Abnormal; Notable for the following components:      Result Value   APPearance HAZY (*)    Hgb urine dipstick LARGE (*)    Ketones, ur 5 (*)    Protein, ur 100 (*)    Nitrite POSITIVE (*)    Leukocytes, UA SMALL (*)    RBC / HPF >50 (*)    WBC, UA >50 (*)    Bacteria, UA FEW (*)    All other components within normal limits  CBC WITH DIFFERENTIAL/PLATELET - Abnormal; Notable for the following components:   RBC 4.03 (*)    MCV 100.7 (*)    MCH 34.2 (*)    Lymphs Abs 0.3 (*)    Abs Immature  Granulocytes 0.09 (*)    All other components within normal limits  BASIC METABOLIC PANEL - Abnormal; Notable for the following components:  Potassium 3.3 (*)    Glucose, Bld 151 (*)    Calcium 8.6 (*)    All other components within normal limits  URINE CULTURE  I-STAT CG4 LACTIC ACID, ED  I-STAT CG4 LACTIC ACID, ED    EKG None  Radiology Ct Head Wo Contrast  Result Date: 06/30/2018 CLINICAL DATA:  Altered mental status.  Head trauma. EXAM: CT HEAD WITHOUT CONTRAST CT CERVICAL SPINE WITHOUT CONTRAST TECHNIQUE: Multidetector CT imaging of the head and cervical spine was performed following the standard protocol without intravenous contrast. Multiplanar CT image reconstructions of the cervical spine were also generated. COMPARISON:  Report from 09/07/2002 FINDINGS: Despite efforts by the technologist and patient, motion artifact is present on today's exam and could not be eliminated. This reduces exam sensitivity and specificity. CT HEAD FINDINGS Brain: The brainstem, cerebellum, cerebral peduncles, thalami, basal ganglia, basilar cisterns, and ventricular system appear within normal limits. No intracranial hemorrhage, mass lesion, or acute CVA. Vascular: Unremarkable Skull: Unremarkable Sinuses/Orbits: Mild chronic right maxillary sinusitis. Other: No supplemental non-categorized findings. CT CERVICAL SPINE FINDINGS Alignment: No vertebral subluxation is observed. Skull base and vertebrae: There is congenital fusion at C2-3 between the vertebral bodies and posterior elements. Prominent loss of articular space anteriorly at C1-2. Schmorl's nodes at C4-5. No appreciable fracture. Soft tissues and spinal canal: No prevertebral soft tissue swelling. Bilateral common carotid atherosclerotic calcification. Disc levels: Facet and uncinate spurring cause foraminal impingement on the right at C3-4, C4-5, C5-6, and C6-7; and on the left at C4-5, C5-6, C6-7, and C7-T1. There is central narrowing of the  thecal sac due to spurring at C3-4 and C4-5. Upper chest: Unremarkable Other: No supplemental non-categorized findings. IMPRESSION: 1. No acute intracranial findings or acute cervical spine findings. 2. Impingement at all levels between C3 and T1 due to facet and uncinate spurring. 3. Congenital fusion at C2-3. 4. Atherosclerosis. 5. Mild chronic right maxillary sinusitis. Electronically Signed   By: Van Clines M.D.   On: 06/30/2018 20:50   Ct Cervical Spine Wo Contrast  Result Date: 06/30/2018 CLINICAL DATA:  Altered mental status.  Head trauma. EXAM: CT HEAD WITHOUT CONTRAST CT CERVICAL SPINE WITHOUT CONTRAST TECHNIQUE: Multidetector CT imaging of the head and cervical spine was performed following the standard protocol without intravenous contrast. Multiplanar CT image reconstructions of the cervical spine were also generated. COMPARISON:  Report from 09/07/2002 FINDINGS: Despite efforts by the technologist and patient, motion artifact is present on today's exam and could not be eliminated. This reduces exam sensitivity and specificity. CT HEAD FINDINGS Brain: The brainstem, cerebellum, cerebral peduncles, thalami, basal ganglia, basilar cisterns, and ventricular system appear within normal limits. No intracranial hemorrhage, mass lesion, or acute CVA. Vascular: Unremarkable Skull: Unremarkable Sinuses/Orbits: Mild chronic right maxillary sinusitis. Other: No supplemental non-categorized findings. CT CERVICAL SPINE FINDINGS Alignment: No vertebral subluxation is observed. Skull base and vertebrae: There is congenital fusion at C2-3 between the vertebral bodies and posterior elements. Prominent loss of articular space anteriorly at C1-2. Schmorl's nodes at C4-5. No appreciable fracture. Soft tissues and spinal canal: No prevertebral soft tissue swelling. Bilateral common carotid atherosclerotic calcification. Disc levels: Facet and uncinate spurring cause foraminal impingement on the right at C3-4,  C4-5, C5-6, and C6-7; and on the left at C4-5, C5-6, C6-7, and C7-T1. There is central narrowing of the thecal sac due to spurring at C3-4 and C4-5. Upper chest: Unremarkable Other: No supplemental non-categorized findings. IMPRESSION: 1. No acute intracranial findings or acute cervical spine findings. 2. Impingement at all  levels between C3 and T1 due to facet and uncinate spurring. 3. Congenital fusion at C2-3. 4. Atherosclerosis. 5. Mild chronic right maxillary sinusitis. Electronically Signed   By: Van Clines M.D.   On: 06/30/2018 20:50   Mr Thoracic Spine Wo Contrast  Result Date: 07/01/2018 CLINICAL DATA:  77 y/o M; worsening back pain, confusion, generalized weakness over the past couple of days. EXAM: MRI THORACIC AND LUMBAR SPINE WITHOUT CONTRAST TECHNIQUE: Multiplanar and multiecho pulse sequences of the thoracic and lumbar spine were obtained without intravenous contrast. The patient was confused and moving, unable to continue the exam, lumbar axial sequences were not acquired. COMPARISON:  06/29/2018 CT abdomen pelvis. FINDINGS: MRI THORACIC SPINE FINDINGS Alignment:  Physiologic. Vertebrae: No fracture, evidence of discitis, or bone lesion. Cord:  Normal signal and morphology. Paraspinal and other soft tissues: Negative. Disc levels: Mild multilevel disc and facet degenerative changes. No significant foraminal or canal stenosis. MRI LUMBAR SPINE FINDINGS Segmentation:  Standard. Alignment:  Straightening of lumbar lordosis.  No listhesis. Vertebrae: No fracture, evidence of discitis, or bone lesion. Bilateral L3-4 facet edema, greater on the right, with edema in surrounding soft tissues, likely related to degenerative facet arthritis. Conus medullaris and cauda equina: Conus extends to the L1 level. Conus and cauda equina appear normal. Paraspinal and other soft tissues: Negative. Disc levels: Sagittal sequences only. 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 L1-2: No  significant disc displacement, foraminal stenosis, or canal stenosis. L2-3: Disc bulge with left-greater-than-right facet hypertrophy. Mild left foraminal stenosis. Moderate to severe spinal canal stenosis. L3-4: Disc bulge with advanced bilateral facet hypertrophy, greater on the right. Moderate bilateral foraminal stenosis. Severe spinal canal stenosis. L4-5: Disc bulge with endplate marginal osteophytes and advanced facet hypertrophy. Moderate right and severe left foraminal stenosis. Mild spinal canal stenosis. L5-S1: Disc bulge with endplate marginal osteophytes and advanced bilateral facet hypertrophy. Moderate to severe bilateral foraminal stenosis. Mild spinal canal stenosis. The disc bulge narrows the bilateral recesses with contact on the descending S1 nerve roots. IMPRESSION: MR THORACIC SPINE IMPRESSION 1. No acute osseous or cord signal abnormality. 2. Mild multilevel discogenic degenerative changes of the thoracic spine. No foraminal or canal stenosis. MR LUMBAR SPINE IMPRESSION 1. Sagittal sequences only. 2. No acute osseous abnormality or malalignment. 3. 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 4. Bilateral L3-4 facet edema, likely degenerative. 5. Moderate to severe L2-3 and severe L3-4 spinal canal stenosis. Multilevel high-grade foraminal stenosis. Electronically Signed   By: Kristine Garbe M.D.   On: 07/01/2018 03:47   Mr Lumbar Spine Wo Contrast  Result Date: 07/01/2018 CLINICAL DATA:  77 y/o M; worsening back pain, confusion, generalized weakness over the past couple of days. EXAM: MRI THORACIC AND LUMBAR SPINE WITHOUT CONTRAST TECHNIQUE: Multiplanar and multiecho pulse sequences of the thoracic and lumbar spine were obtained without intravenous contrast. The patient was confused and moving, unable to continue the exam, lumbar axial sequences were not acquired. COMPARISON:   06/29/2018 CT abdomen pelvis. FINDINGS: MRI THORACIC SPINE FINDINGS Alignment:  Physiologic. Vertebrae: No fracture, evidence of discitis, or bone lesion. Cord:  Normal signal and morphology. Paraspinal and other soft tissues: Negative. Disc levels: Mild multilevel disc and facet degenerative changes. No significant foraminal or canal stenosis. MRI LUMBAR SPINE FINDINGS Segmentation:  Standard. Alignment:  Straightening of lumbar lordosis.  No listhesis. Vertebrae: No fracture, evidence of discitis, or bone lesion. Bilateral L3-4 facet edema, greater on  the right, with edema in surrounding soft tissues, likely related to degenerative facet arthritis. Conus medullaris and cauda equina: Conus extends to the L1 level. Conus and cauda equina appear normal. Paraspinal and other soft tissues: Negative. Disc levels: Sagittal sequences only. 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 L1-2: No significant disc displacement, foraminal stenosis, or canal stenosis. L2-3: Disc bulge with left-greater-than-right facet hypertrophy. Mild left foraminal stenosis. Moderate to severe spinal canal stenosis. L3-4: Disc bulge with advanced bilateral facet hypertrophy, greater on the right. Moderate bilateral foraminal stenosis. Severe spinal canal stenosis. L4-5: Disc bulge with endplate marginal osteophytes and advanced facet hypertrophy. Moderate right and severe left foraminal stenosis. Mild spinal canal stenosis. L5-S1: Disc bulge with endplate marginal osteophytes and advanced bilateral facet hypertrophy. Moderate to severe bilateral foraminal stenosis. Mild spinal canal stenosis. The disc bulge narrows the bilateral recesses with contact on the descending S1 nerve roots. IMPRESSION: MR THORACIC SPINE IMPRESSION 1. No acute osseous or cord signal abnormality. 2. Mild multilevel discogenic degenerative changes of the thoracic spine. No foraminal or canal stenosis. MR LUMBAR SPINE IMPRESSION 1. Sagittal sequences  only. 2. No acute osseous abnormality or malalignment. 3. 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 4. Bilateral L3-4 facet edema, likely degenerative. 5. Moderate to severe L2-3 and severe L3-4 spinal canal stenosis. Multilevel high-grade foraminal stenosis. Electronically Signed   By: Kristine Garbe M.D.   On: 07/01/2018 03:47    Procedures Procedures (including critical care time)  Medications Ordered in ED Medications - No data to display   Initial Impression / Assessment and Plan / ED Course  I have reviewed the triage vital signs and the nursing notes.  Pertinent labs & imaging results that were available during my care of the patient were reviewed by me and considered in my medical decision making (see chart for details).  77 year old male here after a questionable fall.  This is his third ED visit in 3 days, admitted last evening for approximately 12 hours and discharged later this afternoon for UTI.  He was apparently sent home with home health resources, however this has not yet been established.  Whenever patient gets up he reportedly falls and wife is thin and frail and unable to handle him independently at home.  EMS reports patient did not actually have a fall today but was more so assisted to the floor as he seemed to be unstable.  On arrival to the ED, patient pleasantly confused.  He denies any significant pain.  He is not really able to give me any details about the events of today, no family present at bedside for additional history.  His vitals are overall stable.  Labs and repeat CT head pending along with CXR.  Will monitor.  Labs overall reassuring, lactate and WBC count WNL.  UA remains infectious, actually appears worse than yesterday.  Repeat culture pending.  CT head and c-spine negative.  CXR clear.  On chart review-- portion of discharge note reports patient was  sent home with omnicef, however discharge medications indicate ciprofloxacin-- unsure which patient is actually taking.  Nonetheless, he is unable to be cared for at home by his wife without home health set up.  Will give IV Rocephin and re-admit.  Discussed with Dr. Hal Hope-- will see patient and admit for ongoing care.  Final Clinical Impressions(s) / ED Diagnoses   Final diagnoses:  Acute cystitis with hematuria  ED Discharge Orders    None       Kathryne Hitch 07/02/18 0121    Virgel Manifold, MD 07/03/18 [phone]

## 2018-07-02 ENCOUNTER — Other Ambulatory Visit: Payer: Self-pay

## 2018-07-02 ENCOUNTER — Encounter (HOSPITAL_COMMUNITY): Payer: Self-pay | Admitting: Internal Medicine

## 2018-07-02 DIAGNOSIS — G9340 Encephalopathy, unspecified: Secondary | ICD-10-CM | POA: Diagnosis present

## 2018-07-02 DIAGNOSIS — N3001 Acute cystitis with hematuria: Secondary | ICD-10-CM | POA: Diagnosis not present

## 2018-07-02 LAB — CBC WITH DIFFERENTIAL/PLATELET
ABS IMMATURE GRANULOCYTES: 0.09 10*3/uL — AB (ref 0.00–0.07)
Basophils Absolute: 0 10*3/uL (ref 0.0–0.1)
Basophils Relative: 0 %
Eosinophils Absolute: 0 10*3/uL (ref 0.0–0.5)
Eosinophils Relative: 0 %
HCT: 40.6 % (ref 39.0–52.0)
HEMOGLOBIN: 13.8 g/dL (ref 13.0–17.0)
Immature Granulocytes: 1 %
Lymphocytes Relative: 5 %
Lymphs Abs: 0.3 10*3/uL — ABNORMAL LOW (ref 0.7–4.0)
MCH: 34.2 pg — ABNORMAL HIGH (ref 26.0–34.0)
MCHC: 34 g/dL (ref 30.0–36.0)
MCV: 100.7 fL — ABNORMAL HIGH (ref 80.0–100.0)
Monocytes Absolute: 0.5 10*3/uL (ref 0.1–1.0)
Monocytes Relative: 8 %
NEUTROS ABS: 5.5 10*3/uL (ref 1.7–7.7)
Neutrophils Relative %: 86 %
Platelets: DECREASED 10*3/uL (ref 150–400)
RBC: 4.03 MIL/uL — ABNORMAL LOW (ref 4.22–5.81)
RDW: 12.4 % (ref 11.5–15.5)
WBC: 6.4 10*3/uL (ref 4.0–10.5)
nRBC: 0 % (ref 0.0–0.2)

## 2018-07-02 LAB — BASIC METABOLIC PANEL
ANION GAP: 12 (ref 5–15)
Anion gap: 13 (ref 5–15)
BUN: 19 mg/dL (ref 8–23)
BUN: 17 mg/dL (ref 8–23)
CO2: 25 mmol/L (ref 22–32)
CO2: 27 mmol/L (ref 22–32)
Calcium: 8.6 mg/dL — ABNORMAL LOW (ref 8.9–10.3)
Calcium: 8.5 mg/dL — ABNORMAL LOW (ref 8.9–10.3)
Chloride: 100 mmol/L (ref 98–111)
Chloride: 99 mmol/L (ref 98–111)
Creatinine, Ser: 0.87 mg/dL (ref 0.61–1.24)
Creatinine, Ser: 0.77 mg/dL (ref 0.61–1.24)
GFR calc Af Amer: >60 mL/min (ref >60)
GFR calc Af Amer: >60 mL/min (ref >60)
GFR calc non Af Amer: >60 mL/min (ref >60)
GFR calc non Af Amer: >60 mL/min (ref >60)
Glucose, Bld: 151 mg/dL — ABNORMAL HIGH (ref 70–99)
Glucose, Bld: 136 mg/dL — ABNORMAL HIGH (ref 70–99)
Potassium: 3.3 mmol/L — ABNORMAL LOW (ref 3.5–5.1)
Potassium: 3.4 mmol/L — ABNORMAL LOW (ref 3.5–5.1)
Sodium: 138 mmol/L (ref 135–145)
Sodium: 138 mmol/L (ref 135–145)

## 2018-07-02 LAB — CBC
HCT: 42.1 % (ref 39.0–52.0)
Hemoglobin: 13.6 g/dL (ref 13.0–17.0)
MCH: 32.6 pg (ref 26.0–34.0)
MCHC: 32.3 g/dL (ref 30.0–36.0)
MCV: 101 fL — ABNORMAL HIGH (ref 80.0–100.0)
Platelets: 94 10*3/uL — ABNORMAL LOW (ref 150–400)
RBC: 4.17 MIL/uL — ABNORMAL LOW (ref 4.22–5.81)
RDW: 12.4 % (ref 11.5–15.5)
WBC: 6.2 10*3/uL (ref 4.0–10.5)
nRBC: 0 % (ref 0.0–0.2)

## 2018-07-02 MED ORDER — BETHANECHOL CHLORIDE 25 MG PO TABS
25.0000 mg | ORAL_TABLET | Freq: Every day | ORAL | Status: DC
  Administered 2018-07-02 – 2018-07-04 (×3): 25 mg via ORAL
  Filled 2018-07-02 (×3): qty 1

## 2018-07-02 MED ORDER — CYCLOBENZAPRINE HCL 5 MG PO TABS
5.0000 mg | ORAL_TABLET | Freq: Two times a day (BID) | ORAL | Status: DC | PRN
  Administered 2018-07-03 – 2018-07-04 (×2): 5 mg via ORAL
  Filled 2018-07-02 (×2): qty 1

## 2018-07-02 MED ORDER — ENOXAPARIN SODIUM 40 MG/0.4ML ~~LOC~~ SOLN: 40.0000 mg | SUBCUTANEOUS | Status: DC

## 2018-07-02 MED ORDER — LABETALOL HCL 5 MG/ML IV SOLN
10.0000 mg | Freq: Once | INTRAVENOUS | Status: AC
  Administered 2018-07-02: 10 mg via INTRAVENOUS
  Filled 2018-07-02: qty 4

## 2018-07-02 MED ORDER — HYDRALAZINE HCL 20 MG/ML IJ SOLN
10.0000 mg | Freq: Four times a day (QID) | INTRAMUSCULAR | Status: DC | PRN
  Administered 2018-07-02: 10 mg via INTRAVENOUS
  Filled 2018-07-02: qty 1

## 2018-07-02 MED ORDER — SODIUM CHLORIDE 0.9 % IV SOLN
1.0000 g | Freq: Once | INTRAVENOUS | Status: AC
  Administered 2018-07-02: 1 g via INTRAVENOUS
  Filled 2018-07-02: qty 10

## 2018-07-02 MED ORDER — ONDANSETRON HCL 4 MG PO TABS: 4.0000 mg | ORAL_TABLET | Freq: Four times a day (QID) | ORAL | Status: DC | PRN

## 2018-07-02 MED ORDER — HYDRALAZINE HCL 20 MG/ML IJ SOLN
5.0000 mg | INTRAMUSCULAR | Status: DC | PRN
  Administered 2018-07-02 – 2018-07-04 (×3): 5 mg via INTRAVENOUS
  Filled 2018-07-02 (×3): qty 1

## 2018-07-02 MED ORDER — TAMSULOSIN HCL 0.4 MG PO CAPS
0.8000 mg | ORAL_CAPSULE | Freq: Every day | ORAL | Status: DC
  Administered 2018-07-02 – 2018-07-03 (×2): 0.8 mg via ORAL
  Filled 2018-07-02 (×2): qty 2

## 2018-07-02 MED ORDER — SODIUM CHLORIDE 0.9 % IV SOLN
1.0000 g | INTRAVENOUS | Status: DC
  Administered 2018-07-02 – 2018-07-03 (×2): 1 g via INTRAVENOUS
  Filled 2018-07-02 (×3): qty 10

## 2018-07-02 MED ORDER — SENNOSIDES-DOCUSATE SODIUM 8.6-50 MG PO TABS
2.0000 | ORAL_TABLET | Freq: Once | ORAL | Status: AC
  Administered 2018-07-02: 2 via ORAL
  Filled 2018-07-02: qty 2

## 2018-07-02 MED ORDER — ONDANSETRON HCL 4 MG/2ML IJ SOLN: 4.0000 mg | Freq: Four times a day (QID) | INTRAMUSCULAR | Status: DC | PRN

## 2018-07-02 MED ORDER — FINASTERIDE 5 MG PO TABS
5.0000 mg | ORAL_TABLET | Freq: Every day | ORAL | Status: DC
  Administered 2018-07-02 – 2018-07-04 (×3): 5 mg via ORAL
  Filled 2018-07-02 (×3): qty 1

## 2018-07-02 MED ORDER — ACETAMINOPHEN 650 MG RE SUPP: 650.0000 mg | Freq: Four times a day (QID) | RECTAL | Status: DC | PRN

## 2018-07-02 MED ORDER — ACETAMINOPHEN 325 MG PO TABS: 650.0000 mg | ORAL_TABLET | Freq: Four times a day (QID) | ORAL | Status: DC | PRN

## 2018-07-02 MED ORDER — HYDROCODONE-ACETAMINOPHEN 7.5-325 MG PO TABS
1.0000 | ORAL_TABLET | Freq: Four times a day (QID) | ORAL | Status: DC | PRN
  Administered 2018-07-02 – 2018-07-04 (×5): 1 via ORAL
  Filled 2018-07-02 (×5): qty 1

## 2018-07-02 NOTE — Plan of Care (Signed)
Acute rehab goals established. 

## 2018-07-02 NOTE — Progress Notes (Signed)
   07/02/18 0208  Pain Assessment  Pain Scale Faces  Faces Pain Scale 6  Pain Location Back  Pain Intervention(s) MD notified (Comment) (awaiting orders. )

## 2018-07-02 NOTE — Evaluation (Signed)
Physical Therapy Evaluation Patient Details Name: Ricky Black MRN: [id number] DOB: 06-21-1941 Today's Date: 07/02/2018   History of Present Illness  Ricky Black is a 77 y.o. male with history of dementia hypertension who was admitted and discharged yesterday after being admitted for back pain confusion and UTI was brought to the ER after patient's wife called EMS since patient had a fall at home.  PMH: dementia, HTN, chronic back pain on chronic hydrocodone (per wife), and s/p L5 Laminectomy. Baseline dementia with heavy short term memory impairment, but not difficulty with recognizing family.  Clinical Impression  Pt admitted with above diagnosis. Pt currently with functional limitations due to the deficits listed below (see PT Problem List). Per family report, patient has fallen out of bed twice. Upon discharge they were awaiting arrival of a hospital bed which they feel will keep the patient safe, and from falling out of bed again. Unfortunately it has not yet arrived and the family is concerned he will fall again without the hospital bed at home. Denies falling while ambulating, which looks fair today while using a rolling walker. Additionally, they apparently have difficulty using RW through door-ways at home. It is still appropriate for HHPT to assist with these complications once discharged. Daughter is now in town to help, and hopefully HHPT and resolution of encephalopathy will further improve patient's functional ability and safety. Pt will benefit from skilled PT to increase their independence and safety with mobility to allow discharge to the venue listed below.       Follow Up Recommendations Home health PT;Supervision/Assistance - 24 hour;Supervision for mobility/OOB    Equipment Recommendations  Hospital bed  (Has fallen out of bed twice - family feels he will be safe once bed arrives which was apparently ordered last admission?)   Recommendations for Other Services        Precautions / Restrictions Precautions Precautions: Fall Restrictions Weight Bearing Restrictions: No      Mobility  Bed Mobility Overal bed mobility: Needs Assistance Bed Mobility: Sit to Supine       Sit to supine: Supervision   General bed mobility comments: for safety  Transfers Overall transfer level: Needs assistance Equipment used: Rolling walker (2 wheeled) Transfers: Sit to/from Stand Sit to Stand: Min guard;From elevated surface         General transfer comment: Min guard for safety. Slow to rise but stable once upright with RW for support. Cues for technique.  Ambulation/Gait Ambulation/Gait assistance: Min guard Gait Distance (Feet): 100 Feet Assistive device: Rolling walker (2 wheeled) Gait Pattern/deviations: Decreased stride length;Trunk flexed;Trendelenburg;Antalgic;Decreased stance time - left   Gait velocity interpretation: <1.31 ft/sec, indicative of household ambulator General Gait Details: Apparently with some chronic LLE weakness which is evident by trendelenburg and decreased stance time on LT during gait. Good use of RW for support. Cues provided for proximity of walker. One episode of Lt knee buckling slightly but pt able to self correct.  Stairs            Wheelchair Mobility    Modified Rankin (Stroke Patients Only)       Balance Overall balance assessment: Needs assistance Sitting-balance support: No upper extremity supported;Feet supported Sitting balance-Leahy Scale: Good     Standing balance support: Single extremity supported;During functional activity Standing balance-Leahy Scale: Poor                               Pertinent Vitals/Pain  Pain Assessment: Faces Faces Pain Scale: Hurts a little bit Pain Location: back Pain Descriptors / Indicators: Aching    Home Living Family/patient expects to be discharged to:: Private residence Living Arrangements: Spouse/significant other Available Help at  Discharge: Family Type of Home: House Home Access: Stairs to enter Entrance Stairs-Rails: Chemical engineer of Steps: 5 Home Layout: Multi-level;Full bath on main level Home Equipment: Walker - 2 wheels;Cane - single point;Shower seat      Prior Function Level of Independence: Needs assistance   Gait / Transfers Assistance Needed: limited community AMB, limping, not depednent on shopping cart, has to sit frequently.   ADL's / Homemaking Assistance Needed: requires reminders for completion, but able to perform indpendently        Hand Dominance        Extremity/Trunk Assessment   Upper Extremity Assessment Upper Extremity Assessment: Defer to OT evaluation    Lower Extremity Assessment Lower Extremity Assessment: Generalized weakness       Communication   Communication: No difficulties  Cognition Arousal/Alertness: Awake/alert Behavior During Therapy: WFL for tasks assessed/performed Overall Cognitive Status: History of cognitive impairments - at baseline                                        General Comments General comments (skin integrity, edema, etc.): Mild dyspnea on exertion. HR to 90 with gait. Daughter present during evaluation and is helpful with history.    Exercises     Assessment/Plan    PT Assessment Patient needs continued PT services  PT Problem List Decreased strength;Decreased activity tolerance;Decreased balance;Decreased mobility;Decreased knowledge of use of DME;Decreased cognition       PT Treatment Interventions DME instruction;Stair training;Gait training;Functional mobility training;Therapeutic activities;Patient/family education;Cognitive remediation;Balance training;Therapeutic exercise    PT Goals (Current goals can be found in the Care Plan section)  Acute Rehab PT Goals Patient Stated Goal: get back to being more independent PT Goal Formulation: With patient Time For Goal Achievement:  07/09/18 Potential to Achieve Goals: Fair    Frequency Min 3X/week   Barriers to discharge Decreased caregiver support wife unable to provide physical assist for patient.    Co-evaluation               AM-PAC PT "6 Clicks" Mobility  Outcome Measure Help needed turning from your back to your side while in a flat bed without using bedrails?: A Little Help needed moving from lying on your back to sitting on the side of a flat bed without using bedrails?: A Little Help needed moving to and from a bed to a chair (including a wheelchair)?: A Little Help needed standing up from a chair using your arms (e.g., wheelchair or bedside chair)?: A Little Help needed to walk in hospital room?: A Little Help needed climbing 3-5 steps with a railing? : A Little 6 Click Score: 18    End of Session Equipment Utilized During Treatment: Gait belt Activity Tolerance: Patient tolerated treatment well Patient left: in bed;with call bell/phone within reach;with bed alarm set;with family/visitor present   PT Visit Diagnosis: Unsteadiness on feet (R26.81);Muscle weakness (generalized) (M62.81);Difficulty in walking, not elsewhere classified (R26.2);Repeated falls (R29.6);History of falling (Z91.81)    Time: 1641-1710 PT Time Calculation (min) (ACUTE ONLY): 29 min   Charges:   PT Evaluation $PT Eval Moderate Complexity: 1 Mod PT Treatments $Gait Training: 8-22 mins  Elayne Snare, PT, DPT  Ellouise Newer 07/02/2018, 4:24 PM

## 2018-07-02 NOTE — Progress Notes (Signed)
Report received from ED RN-Kari.

## 2018-07-02 NOTE — Progress Notes (Signed)
PROGRESS NOTE    Ricky Black  WCH:[id number] DOB: [birth year] DOA: 07/01/2018 PCP: Marton Redwood, MD   Brief Narrative:  HPI on 07/02/2018 by Dr. Gean Birchwood Ricky Black is a 77 y.o. male with history of dementia hypertension who was admitted and discharged yesterday after being admitted for back pain confusion and UTI was brought to the ER after patient's wife called EMS since patient had a fall at home.  Is not clear if patient had a fall or not since patient does not recall and has dementia.  Patient's wife stated to the EMS that patient was about to fall and had to be eased onto the floor.  Patient has been having some difficulty walking since.  Also found to be confused. Assessment & Plan   Admitted earlier today by Dr. Hal Hope. See H&P for details.  Acute metabolic encephalopathy -Suspect combination of urinary tract infection with dementia and possibly pain medications -Will continue to treat UTI and monitor mental status -Patient continues to remain confused, however unknown baseline -CT head no acute intracranial findings  Urinary tract infection -Patient recently discharged 07/01/2018.   -UA showed few bacteria, small leukocytes, positive nitrates,>WBC: he was given Omnicef. -Urine culture 06/30/2018 showed greater than 100,000 staph aureus.  Susceptibilities pending -Continue ceftriaxone  Essential hypertension -Patient has multiple antihypertensive medications however no dosages are noted.  Currently on IV hydralazine -BP is uncontrolled  Back pain -Patient has been getting pain medication which could be contributing to some of his confusion -Patient was recently admitted and discharged on 07/01/2018.  He had undergone MRI of the lumbar spine which showed lumbar spinal canal stenosis with degenerative changes.  MRI thoracic spine showed multilevel disc and facet degenerative changes.  He was seen by physical therapy and home health was  recommended.  Dementia -Appears to be on Namenda and Aricept at home however no dosages of medications were provided  Thrombocytopenia -?  unknown etiology -Platelets currently 94, will continue to monitor CBC  Hypokalemia -Will replace and continue to monitor BMP  DVT Prophylaxis  SCDs  Code Status: Full  Family Communication: None at bedside  Disposition Plan: Observation, likely discharge to home with [facility] PT on 07/03/18  Consultants None  Procedures  None  Antibiotics   Anti-infectives (From admission, onward)   Start     Dose/Rate Route Frequency Ordered Stop   07/02/18 1800  cefTRIAXone (ROCEPHIN) 1 g in sodium chloride 0.9 % 100 mL IVPB     1 g 200 mL/hr over 30 Minutes Intravenous Every 24 hours 07/02/18 0448     07/02/18 0045  cefTRIAXone (ROCEPHIN) 1 g in sodium chloride 0.9 % 100 mL IVPB     1 g 200 mL/hr over 30 Minutes Intravenous  Once 07/02/18 0044 07/02/18 0134      Subjective:   Ricky Black seen and examined today.  Patient still confused. Has no complaints.   Objective:   Vitals:   07/02/18 0313 07/02/18 0414 07/02/18 0713 07/02/18 0857  BP: (!) 189/94 (!) 181/85 (!) 172/90 (!) 176/78  Pulse: 73 70 66 66  Resp:  18  18  Temp:  99.2 F (37.3 C)  99.6 F (37.6 C)  TempSrc:  Oral  Oral  SpO2:  97%  97%  Weight:  112 kg      Intake/Output Summary (Last 24 hours) at 07/02/2018 1253 Last data filed at 07/02/2018 0900 Gross per 24 hour  Intake 220 ml  Output 0 ml  Net 220 ml  Filed Weights   07/02/18 0414  Weight: 112 kg    Exam No exam-patient admitted after midnight  Data Reviewed: I have personally reviewed following labs and imaging studies  CBC: Recent Labs  Lab 06/29/18 1511 06/30/18 1926 07/01/18 2324 07/02/18 0516  WBC 10.4 8.2 6.4 6.2  NEUTROABS  --  7.3 5.5  --   HGB 14.6 14.3 13.8 13.6  HCT 45.2 43.0 40.6 42.1  MCV 102.5* 101.4* 100.7* 101.0*  PLT 132* 99* PLATELET CLUMPS NOTED ON SMEAR, COUNT APPEARS DECREASED  94*   Basic Metabolic Panel: Recent Labs  Lab 06/29/18 1511 06/30/18 1926 07/01/18 2324 07/02/18 0516  NA 137 139 138 138  K 4.2 3.4* 3.3* 3.4*  CL 103 100 100 99  CO2 23 27 25 27   GLUCOSE 201* 144* 151* 136*  BUN 22 18 19 17   CREATININE 1.10 0.96 0.87 0.77  CALCIUM 9.7 9.3 8.6* 8.5*   GFR: CrCl cannot be calculated (Unknown ideal weight.). Liver Function Tests: Recent Labs  Lab 06/29/18 1511 06/30/18 1926  AST 30 33  ALT 37 31  ALKPHOS 43 35*  BILITOT 0.9 1.2  PROT 6.8 6.5  ALBUMIN 4.3 3.9   No results for input(s): LIPASE, AMYLASE in the last 168 hours. Recent Labs  Lab 07/01/18 0153  AMMONIA 25   Coagulation Profile: No results for input(s): INR, PROTIME in the last 168 hours. Cardiac Enzymes: Recent Labs  Lab 07/01/18 0153  CKTOTAL 665*   BNP (last 3 results) No results for input(s): PROBNP in the last 8760 hours. HbA1C: No results for input(s): HGBA1C in the last 72 hours. CBG: No results for input(s): GLUCAP in the last 168 hours. Lipid Profile: No results for input(s): CHOL, HDL, LDLCALC, TRIG, CHOLHDL, LDLDIRECT in the last 72 hours. Thyroid Function Tests: No results for input(s): TSH, T4TOTAL, FREET4, T3FREE, THYROIDAB in the last 72 hours. Anemia Panel: No results for input(s): VITAMINB12, FOLATE, FERRITIN, TIBC, IRON, RETICCTPCT in the last 72 hours. Urine analysis:    Component Value Date/Time   COLORURINE YELLOW 07/01/2018 2338   APPEARANCEUR HAZY (A) 07/01/2018 2338   LABSPEC 1.025 07/01/2018 2338   PHURINE 6.0 07/01/2018 2338   GLUCOSEU NEGATIVE 07/01/2018 2338   HGBUR LARGE (A) 07/01/2018 2338   BILIRUBINUR NEGATIVE 07/01/2018 2338   KETONESUR 5 (A) 07/01/2018 2338   PROTEINUR 100 (A) 07/01/2018 2338   NITRITE POSITIVE (A) 07/01/2018 2338   LEUKOCYTESUR SMALL (A) 07/01/2018 2338   Sepsis Labs: @LABRCNTIP (procalcitonin:4,lacticidven:4)  ) Recent Results (from the past 240 hour(s))  Urine culture     Status: None    Collection Time: 06/29/18  7:06 PM  Result Value Ref Range Status   Specimen Description URINE, CLEAN CATCH  Final   Special Requests NONE  Final   Culture   Final    NO GROWTH Performed at [facility], [place] [address] [place], [place] [address]    Report Status 07/01/2018 FINAL  Final  Urine culture     Status: Abnormal (Preliminary result)   Collection Time: 06/30/18  9:39 PM  Result Value Ref Range Status   Specimen Description URINE, CLEAN CATCH  Final   Special Requests   Final    NONE Performed at [facility], [place] [address], [place], [place] [address]    Culture >=100,000 COLONIES/mL STAPHYLOCOCCUS AUREUS (A)  Final   Report Status PENDING  Incomplete      Radiology Studies: Dg Chest 1 View  Result Date: 07/01/2018 CLINICAL DATA:  Fall. EXAM: CHEST  1 VIEW COMPARISON:  Chest radiograph August 07, 2005 FINDINGS: The cardiac silhouette is upper limits of normal in size. Mediastinal silhouette is not suspicious. No pleural effusion or focal consolidation. No pneumothorax. Soft tissue planes and included osseous structures are nonacute. IMPRESSION: Borderline cardiomegaly, no acute pulmonary process. Electronically Signed   By: Elon Alas M.D.   On: 07/01/2018 23:05   Ct Head Wo Contrast  Result Date: 07/01/2018 CLINICAL DATA:  Altered mental status S. Fall. Urinary tract infection. Confusion. EXAM: CT HEAD WITHOUT CONTRAST CT CERVICAL SPINE WITHOUT CONTRAST TECHNIQUE: Multidetector CT imaging of the head and cervical spine was performed following the standard protocol without intravenous contrast. Multiplanar CT image reconstructions of the cervical spine were also generated. COMPARISON:  06/30/2018 FINDINGS: Despite efforts by the technologist and patient, motion artifact is present on today's exam and could not be eliminated. This reduces exam sensitivity and specificity. CT HEAD FINDINGS Brain: The brainstem, cerebellum, cerebral peduncles, thalami, basal  ganglia, basilar cisterns, and ventricular system appear within normal limits. No intracranial hemorrhage, mass lesion, or acute CVA. Overall no significant intracranial abnormality. Vascular: Unremarkable Skull: Unremarkable Sinuses/Orbits: Chronic right maxillary and ethmoid sinusitis. Other: No supplemental non-categorized findings. CT CERVICAL SPINE FINDINGS Alignment: No vertebral subluxation is observed. Skull base and vertebrae: Reduced sensitivity due to motion, no discrete fracture is identified. Fused C2 and C3 vertebra. Soft tissues and spinal canal: Bilateral common carotid atherosclerotic calcification. Disc levels: Foraminal impingement at all cervical levels due to uncinate spurring and facet arthropathy. Upper chest: Unremarkable Other: Served skip other IMPRESSION: 1. No acute intracranial findings or acute cervical spine findings. 2. Chronic right maxillary and ethmoid sinusitis. 3. Reduced sensitivity due to motion artifact. 4. Foraminal impingement at all cervical levels due to uncinate spurring and facet arthropathy. 5. Congenital fusion at C2-3. Electronically Signed   By: Van Clines M.D.   On: 07/01/2018 23:27   Ct Head Wo Contrast  Result Date: 06/30/2018 CLINICAL DATA:  Altered mental status.  Head trauma. EXAM: CT HEAD WITHOUT CONTRAST CT CERVICAL SPINE WITHOUT CONTRAST TECHNIQUE: Multidetector CT imaging of the head and cervical spine was performed following the standard protocol without intravenous contrast. Multiplanar CT image reconstructions of the cervical spine were also generated. COMPARISON:  Report from 09/07/2002 FINDINGS: Despite efforts by the technologist and patient, motion artifact is present on today's exam and could not be eliminated. This reduces exam sensitivity and specificity. CT HEAD FINDINGS Brain: The brainstem, cerebellum, cerebral peduncles, thalami, basal ganglia, basilar cisterns, and ventricular system appear within normal limits. No intracranial  hemorrhage, mass lesion, or acute CVA. Vascular: Unremarkable Skull: Unremarkable Sinuses/Orbits: Mild chronic right maxillary sinusitis. Other: No supplemental non-categorized findings. CT CERVICAL SPINE FINDINGS Alignment: No vertebral subluxation is observed. Skull base and vertebrae: There is congenital fusion at C2-3 between the vertebral bodies and posterior elements. Prominent loss of articular space anteriorly at C1-2. Schmorl's nodes at C4-5. No appreciable fracture. Soft tissues and spinal canal: No prevertebral soft tissue swelling. Bilateral common carotid atherosclerotic calcification. Disc levels: Facet and uncinate spurring cause foraminal impingement on the right at C3-4, C4-5, C5-6, and C6-7; and on the left at C4-5, C5-6, C6-7, and C7-T1. There is central narrowing of the thecal sac due to spurring at C3-4 and C4-5. Upper chest: Unremarkable Other: No supplemental non-categorized findings. IMPRESSION: 1. No acute intracranial findings or acute cervical spine findings. 2. Impingement at all levels between C3 and T1 due to facet and uncinate spurring. 3. Congenital fusion  at C2-3. 4. Atherosclerosis. 5. Mild chronic right maxillary sinusitis. Electronically Signed   By: Van Clines M.D.   On: 06/30/2018 20:50   Ct Cervical Spine Wo Contrast  Result Date: 07/01/2018 CLINICAL DATA:  Altered mental status S. Fall. Urinary tract infection. Confusion. EXAM: CT HEAD WITHOUT CONTRAST CT CERVICAL SPINE WITHOUT CONTRAST TECHNIQUE: Multidetector CT imaging of the head and cervical spine was performed following the standard protocol without intravenous contrast. Multiplanar CT image reconstructions of the cervical spine were also generated. COMPARISON:  06/30/2018 FINDINGS: Despite efforts by the technologist and patient, motion artifact is present on today's exam and could not be eliminated. This reduces exam sensitivity and specificity. CT HEAD FINDINGS Brain: The brainstem, cerebellum, cerebral  peduncles, thalami, basal ganglia, basilar cisterns, and ventricular system appear within normal limits. No intracranial hemorrhage, mass lesion, or acute CVA. Overall no significant intracranial abnormality. Vascular: Unremarkable Skull: Unremarkable Sinuses/Orbits: Chronic right maxillary and ethmoid sinusitis. Other: No supplemental non-categorized findings. CT CERVICAL SPINE FINDINGS Alignment: No vertebral subluxation is observed. Skull base and vertebrae: Reduced sensitivity due to motion, no discrete fracture is identified. Fused C2 and C3 vertebra. Soft tissues and spinal canal: Bilateral common carotid atherosclerotic calcification. Disc levels: Foraminal impingement at all cervical levels due to uncinate spurring and facet arthropathy. Upper chest: Unremarkable Other: Served skip other IMPRESSION: 1. No acute intracranial findings or acute cervical spine findings. 2. Chronic right maxillary and ethmoid sinusitis. 3. Reduced sensitivity due to motion artifact. 4. Foraminal impingement at all cervical levels due to uncinate spurring and facet arthropathy. 5. Congenital fusion at C2-3. Electronically Signed   By: Van Clines M.D.   On: 07/01/2018 23:27   Ct Cervical Spine Wo Contrast  Result Date: 06/30/2018 CLINICAL DATA:  Altered mental status.  Head trauma. EXAM: CT HEAD WITHOUT CONTRAST CT CERVICAL SPINE WITHOUT CONTRAST TECHNIQUE: Multidetector CT imaging of the head and cervical spine was performed following the standard protocol without intravenous contrast. Multiplanar CT image reconstructions of the cervical spine were also generated. COMPARISON:  Report from 09/07/2002 FINDINGS: Despite efforts by the technologist and patient, motion artifact is present on today's exam and could not be eliminated. This reduces exam sensitivity and specificity. CT HEAD FINDINGS Brain: The brainstem, cerebellum, cerebral peduncles, thalami, basal ganglia, basilar cisterns, and ventricular system appear  within normal limits. No intracranial hemorrhage, mass lesion, or acute CVA. Vascular: Unremarkable Skull: Unremarkable Sinuses/Orbits: Mild chronic right maxillary sinusitis. Other: No supplemental non-categorized findings. CT CERVICAL SPINE FINDINGS Alignment: No vertebral subluxation is observed. Skull base and vertebrae: There is congenital fusion at C2-3 between the vertebral bodies and posterior elements. Prominent loss of articular space anteriorly at C1-2. Schmorl's nodes at C4-5. No appreciable fracture. Soft tissues and spinal canal: No prevertebral soft tissue swelling. Bilateral common carotid atherosclerotic calcification. Disc levels: Facet and uncinate spurring cause foraminal impingement on the right at C3-4, C4-5, C5-6, and C6-7; and on the left at C4-5, C5-6, C6-7, and C7-T1. There is central narrowing of the thecal sac due to spurring at C3-4 and C4-5. Upper chest: Unremarkable Other: No supplemental non-categorized findings. IMPRESSION: 1. No acute intracranial findings or acute cervical spine findings. 2. Impingement at all levels between C3 and T1 due to facet and uncinate spurring. 3. Congenital fusion at C2-3. 4. Atherosclerosis. 5. Mild chronic right maxillary sinusitis. Electronically Signed   By: Van Clines M.D.   On: 06/30/2018 20:50   Mr Thoracic Spine Wo Contrast  Result Date: 07/01/2018 CLINICAL DATA:  77 y/o M;  worsening back pain, confusion, generalized weakness over the past couple of days. EXAM: MRI THORACIC AND LUMBAR SPINE WITHOUT CONTRAST TECHNIQUE: Multiplanar and multiecho pulse sequences of the thoracic and lumbar spine were obtained without intravenous contrast. The patient was confused and moving, unable to continue the exam, lumbar axial sequences were not acquired. COMPARISON:  06/29/2018 CT abdomen pelvis. FINDINGS: MRI THORACIC SPINE FINDINGS Alignment:  Physiologic. Vertebrae: No fracture, evidence of discitis, or bone lesion. Cord:  Normal signal and  morphology. Paraspinal and other soft tissues: Negative. Disc levels: Mild multilevel disc and facet degenerative changes. No significant foraminal or canal stenosis. MRI LUMBAR SPINE FINDINGS Segmentation:  Standard. Alignment:  Straightening of lumbar lordosis.  No listhesis. Vertebrae: No fracture, evidence of discitis, or bone lesion. Bilateral L3-4 facet edema, greater on the right, with edema in surrounding soft tissues, likely related to degenerative facet arthritis. Conus medullaris and cauda equina: Conus extends to the L1 level. Conus and cauda equina appear normal. Paraspinal and other soft tissues: Negative. Disc levels: Sagittal sequences only. 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 L1-2: No significant disc displacement, foraminal stenosis, or canal stenosis. L2-3: Disc bulge with left-greater-than-right facet hypertrophy. Mild left foraminal stenosis. Moderate to severe spinal canal stenosis. L3-4: Disc bulge with advanced bilateral facet hypertrophy, greater on the right. Moderate bilateral foraminal stenosis. Severe spinal canal stenosis. L4-5: Disc bulge with endplate marginal osteophytes and advanced facet hypertrophy. Moderate right and severe left foraminal stenosis. Mild spinal canal stenosis. L5-S1: Disc bulge with endplate marginal osteophytes and advanced bilateral facet hypertrophy. Moderate to severe bilateral foraminal stenosis. Mild spinal canal stenosis. The disc bulge narrows the bilateral recesses with contact on the descending S1 nerve roots. IMPRESSION: MR THORACIC SPINE IMPRESSION 1. No acute osseous or cord signal abnormality. 2. Mild multilevel discogenic degenerative changes of the thoracic spine. No foraminal or canal stenosis. MR LUMBAR SPINE IMPRESSION 1. Sagittal sequences only. 2. No acute osseous abnormality or malalignment. 3. Congenital lumbar spinal canal stenosis with short pedicles and lumbar spondylosis with advanced facet arthropathy greatest  at L2-3 and L3-4. 4. Bilateral L3-4 facet edema, likely degenerative. 5. Moderate to severe L2-3 and severe L3-4 spinal canal stenosis. Multilevel high-grade foraminal stenosis. Electronically Signed   By: Kristine Garbe M.D.   On: 07/01/2018 03:47   Mr Lumbar Spine Wo Contrast  Result Date: 07/01/2018 CLINICAL DATA:  77 y/o M; worsening back pain, confusion, generalized weakness over the past couple of days. EXAM: MRI THORACIC AND LUMBAR SPINE WITHOUT CONTRAST TECHNIQUE: Multiplanar and multiecho pulse sequences of the thoracic and lumbar spine were obtained without intravenous contrast. The patient was confused and moving, unable to continue the exam, lumbar axial sequences were not acquired. COMPARISON:  06/29/2018 CT abdomen pelvis. FINDINGS: MRI THORACIC SPINE FINDINGS Alignment:  Physiologic. Vertebrae: No fracture, evidence of discitis, or bone lesion. Cord:  Normal signal and morphology. Paraspinal and other soft tissues: Negative. Disc levels: Mild multilevel disc and facet degenerative changes. No significant foraminal or canal stenosis. MRI LUMBAR SPINE FINDINGS Segmentation:  Standard. Alignment:  Straightening of lumbar lordosis.  No listhesis. Vertebrae: No fracture, evidence of discitis, or bone lesion. Bilateral L3-4 facet edema, greater on the right, with edema in surrounding soft tissues, likely related to degenerative facet arthritis. Conus medullaris and cauda equina: Conus extends to the L1 level. Conus and cauda equina appear normal. Paraspinal and other soft tissues: Negative. Disc levels: Sagittal sequences only. 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 L1-2: No significant disc displacement,  foraminal stenosis, or canal stenosis. L2-3: Disc bulge with left-greater-than-right facet hypertrophy. Mild left foraminal stenosis. Moderate to severe spinal canal stenosis. L3-4: Disc bulge with advanced bilateral facet hypertrophy, greater on the right. Moderate  bilateral foraminal stenosis. Severe spinal canal stenosis. L4-5: Disc bulge with endplate marginal osteophytes and advanced facet hypertrophy. Moderate right and severe left foraminal stenosis. Mild spinal canal stenosis. L5-S1: Disc bulge with endplate marginal osteophytes and advanced bilateral facet hypertrophy. Moderate to severe bilateral foraminal stenosis. Mild spinal canal stenosis. The disc bulge narrows the bilateral recesses with contact on the descending S1 nerve roots. IMPRESSION: MR THORACIC SPINE IMPRESSION 1. No acute osseous or cord signal abnormality. 2. Mild multilevel discogenic degenerative changes of the thoracic spine. No foraminal or canal stenosis. MR LUMBAR SPINE IMPRESSION 1. Sagittal sequences only. 2. No acute osseous abnormality or malalignment. 3. 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 4. Bilateral L3-4 facet edema, likely degenerative. 5. Moderate to severe L2-3 and severe L3-4 spinal canal stenosis. Multilevel high-grade foraminal stenosis. Electronically Signed   By: Kristine Garbe M.D.   On: 07/01/2018 03:47     Scheduled Meds: . bethanechol  25 mg Oral Daily  . finasteride  5 mg Oral Daily  . tamsulosin  0.8 mg Oral QHS   Continuous Infusions: . cefTRIAXone (ROCEPHIN)  IV       LOS: 0 days   Time Spent in minutes   30 minutes  Amadeo Coke D.O. on 07/02/2018 at 12:53 PM  Between 7am to 7pm - Please see pager noted on [email or web address]  After 7pm go to [email or web address]  And look for the night coverage person covering for me after hours  [facility] Office  [phone]

## 2018-07-02 NOTE — H&P (Signed)
History and Physical    Ricky ARYA [name] DOB: [birth year] DOA: 07/01/2018  PCP: Marton Redwood, MD  Patient coming from: Home.  Chief Complaint: Fall and confusion.  HPI: Ricky Black is a 77 y.o. male with history of dementia hypertension who was admitted and discharged yesterday after being admitted for back pain confusion and UTI was brought to the ER after patient's wife called EMS since patient had a fall at home.  Is not clear if patient had a fall or not since patient does not recall and has dementia.  Patient's wife stated to the EMS that patient was about to fall and had to be eased onto the floor.  Patient has been having some difficulty walking since.  Also found to be confused.  ED Course: In the ER CT head and C-spine was unremarkable.  Recent admission for back pain patient had MRI of the T-spine L-spine which showed degenerative disease.  At this time patient appears confused not following commands and risk for falls and UA shows consistent with UTI.  Started on ceftriaxone and admitted for further management.  Review of Systems: As per HPI, rest all negative.   Past Medical History:  Diagnosis Date  . Abnormal EKG    Prior tracing  with reading can't rule out old septal MI  . Bradycardia   . Dementia ([place])   . Dizziness   . Hypogonadism male   . Neuropathy    (R) Dorsal foot  . OAB (overactive bladder)   . Olecranon bursitis   . Orthostatic hypotension   . Spinal arthritis    ESI    Past Surgical History:  Procedure Laterality Date  . COLONOSCOPY  04/15/2012  . LUMBAR LAMINECTOMY    . TOTAL HIP ARTHROPLASTY     Right  . UMBILICAL HERNIA REPAIR       reports that he has quit smoking. He has never used smokeless tobacco. He reports that he does not drink alcohol. No history on file for drug.  No Known Allergies  Family History  Family history unknown: Yes    Prior to Admission medications   Medication Sig Start Date End Date Taking?  Authorizing Provider  bethanechol (URECHOLINE) 25 MG tablet Take 25 mg by mouth daily.    [provider]  celecoxib (CELEBREX) 200 MG capsule Take 200 mg by mouth daily.    [provider]  ciprofloxacin (CIPRO) 500 MG tablet Take 1 tablet (500 mg total) by mouth 2 (two) times daily for 5 days. 07/01/18 07/06/18  Pokhrel, Corrie Mckusick, MD  cyclobenzaprine (FLEXERIL) 5 MG tablet Take 1 tablet (5 mg total) by mouth 2 (two) times daily as needed for muscle spasms. 06/29/18   Gareth Morgan, MD  DONEPEZIL HCL PO Take 1 tablet by mouth at bedtime.    [provider]  finasteride (PROSCAR) 5 MG tablet Take 5 mg by mouth daily.    [provider]  HYDROCHLOROTHIAZIDE PO Take 1 tablet by mouth daily.    [provider]  HYDROcodone-acetaminophen (NORCO) 7.5-325 MG per tablet Take 1 tablet by mouth every 6 (six) hours as needed for pain.    [provider]  Memantine HCl (NAMENDA PO) Take 1 tablet by mouth 2 (two) times daily.    [provider]  Pregabalin (LYRICA PO) Take 1 capsule by mouth 2 (two) times daily.    [provider]  Sertraline HCl (ZOLOFT PO) Take 1 tablet by mouth daily.    [provider]  tamsulosin (FLOMAX) 0.4 MG CAPS capsule Take 0.8 mg by mouth at bedtime.    [provider]  VALSARTAN PO Take 1 tablet by mouth daily.    [provider]    Physical Exam: Vitals:   07/02/18 0208 07/02/18 0227 07/02/18 0313 07/02/18 0414  BP: (!) 187/90 (!) 177/87 (!) 189/94 (!) 181/85  Pulse: 69 63 73 70  Resp: 18   18  Temp: 99.4 F (37.4 C)   99.2 F (37.3 C)  TempSrc: Oral   Oral  SpO2: 98%   97%      Constitutional: Moderately built and nourished. Vitals:   07/02/18 0208 07/02/18 0227 07/02/18 0313 07/02/18 0414  BP: (!) 187/90 (!) 177/87 (!) 189/94 (!) 181/85  Pulse: 69 63 73 70  Resp: 18   18  Temp: 99.4 F (37.4 C)   99.2 F (37.3 C)  TempSrc: Oral   Oral  SpO2: 98%   97%    Eyes: Anicteric no pallor. ENMT: No discharge from the ears eyes nose or mouth. Neck: No mass felt.  No neck rigidity no JVD appreciated. Respiratory: No rhonchi or crepitations. Cardiovascular: S1-S2 heard. Abdomen: Soft nontender bowel sounds present. Musculoskeletal: No edema. Skin: No rash. Neurologic: Alert awake oriented to his name.  Moves all extremities. Psychiatric: Oriented to his name only.   Labs on Admission: I have personally reviewed following labs and imaging studies  CBC: Recent Labs  Lab 06/29/18 1511 06/30/18 1926 07/01/18 2324  WBC 10.4 8.2 6.4  NEUTROABS  --  7.3 5.5  HGB 14.6 14.3 13.8  HCT 45.2 43.0 40.6  MCV 102.5* 101.4* 100.7*  PLT 132* 99* PLATELET CLUMPS NOTED ON SMEAR, COUNT APPEARS DECREASED   Basic Metabolic Panel: Recent Labs  Lab 06/29/18 1511 06/30/18 1926 07/01/18 2324  NA 137 139 138  K 4.2 3.4* 3.3*  CL 103 100 100  CO2 23 27 25   GLUCOSE 201* 144* 151*  BUN 22 18 19   CREATININE 1.10 0.96 0.87  CALCIUM 9.7 9.3 8.6*   GFR: CrCl cannot be calculated (Unknown ideal weight.). Liver Function Tests: Recent Labs  Lab 06/29/18 1511 06/30/18 1926  AST 30 33  ALT 37 31  ALKPHOS 43 35*  BILITOT 0.9 1.2  PROT 6.8 6.5  ALBUMIN 4.3 3.9   No results for input(s): LIPASE, AMYLASE in the last 168 hours. Recent Labs  Lab 07/01/18 0153  AMMONIA 25   Coagulation Profile: No results for input(s): INR, PROTIME in the last 168 hours. Cardiac Enzymes: Recent Labs  Lab 07/01/18 0153  CKTOTAL 665*   BNP (last 3 results) No results for input(s): PROBNP in the last 8760 hours. HbA1C: No results for input(s): HGBA1C in the last 72 hours. CBG: No results for input(s): GLUCAP in the last 168 hours. Lipid Profile: No results for input(s): CHOL, HDL, LDLCALC, TRIG, CHOLHDL, LDLDIRECT in the last 72 hours. Thyroid Function Tests: No results for input(s): TSH, T4TOTAL, FREET4, T3FREE, THYROIDAB in the last 72 hours. Anemia  Panel: No results for input(s): VITAMINB12, FOLATE, FERRITIN, TIBC, IRON, RETICCTPCT in the last 72 hours. Urine analysis:    Component Value Date/Time   COLORURINE YELLOW 07/01/2018 2338   APPEARANCEUR HAZY (A) 07/01/2018 2338   LABSPEC 1.025 07/01/2018 2338   PHURINE 6.0 07/01/2018 2338   GLUCOSEU NEGATIVE 07/01/2018 2338   HGBUR LARGE (A) 07/01/2018 2338   BILIRUBINUR NEGATIVE 07/01/2018 2338   KETONESUR 5 (A) 07/01/2018 2338   PROTEINUR 100 (A) 07/01/2018 2338  NITRITE POSITIVE (A) 07/01/2018 2338   LEUKOCYTESUR SMALL (A) 07/01/2018 2338   Sepsis Labs: @LABRCNTIP (procalcitonin:4,lacticidven:4) ) Recent Results (from the past 240 hour(s))  Urine culture     Status: None   Collection Time: 06/29/18  7:06 PM  Result Value Ref Range Status   Specimen Description URINE, CLEAN CATCH  Final   Special Requests NONE  Final   Culture   Final    NO GROWTH Performed at [facility], [place] [address] [place], [place] [address]    Report Status 07/01/2018 FINAL  Final     Radiological Exams on Admission: Dg Chest 1 View  Result Date: 07/01/2018 CLINICAL DATA:  Fall. EXAM: CHEST  1 VIEW COMPARISON:  Chest radiograph August 07, 2005 FINDINGS: The cardiac silhouette is upper limits of normal in size. Mediastinal silhouette is not suspicious. No pleural effusion or focal consolidation. No pneumothorax. Soft tissue planes and included osseous structures are nonacute. IMPRESSION: Borderline cardiomegaly, no acute pulmonary process. Electronically Signed   By: Elon Alas M.D.   On: 07/01/2018 23:05   Ct Head Wo Contrast  Result Date: 07/01/2018 CLINICAL DATA:  Altered mental status S. Fall. Urinary tract infection. Confusion. EXAM: CT HEAD WITHOUT CONTRAST CT CERVICAL SPINE WITHOUT CONTRAST TECHNIQUE: Multidetector CT imaging of the head and cervical spine was performed following the standard protocol without intravenous contrast. Multiplanar CT image reconstructions of the  cervical spine were also generated. COMPARISON:  06/30/2018 FINDINGS: Despite efforts by the technologist and patient, motion artifact is present on today's exam and could not be eliminated. This reduces exam sensitivity and specificity. CT HEAD FINDINGS Brain: The brainstem, cerebellum, cerebral peduncles, thalami, basal ganglia, basilar cisterns, and ventricular system appear within normal limits. No intracranial hemorrhage, mass lesion, or acute CVA. Overall no significant intracranial abnormality. Vascular: Unremarkable Skull: Unremarkable Sinuses/Orbits: Chronic right maxillary and ethmoid sinusitis. Other: No supplemental non-categorized findings. CT CERVICAL SPINE FINDINGS Alignment: No vertebral subluxation is observed. Skull base and vertebrae: Reduced sensitivity due to motion, no discrete fracture is identified. Fused C2 and C3 vertebra. Soft tissues and spinal canal: Bilateral common carotid atherosclerotic calcification. Disc levels: Foraminal impingement at all cervical levels due to uncinate spurring and facet arthropathy. Upper chest: Unremarkable Other: Served skip other IMPRESSION: 1. No acute intracranial findings or acute cervical spine findings. 2. Chronic right maxillary and ethmoid sinusitis. 3. Reduced sensitivity due to motion artifact. 4. Foraminal impingement at all cervical levels due to uncinate spurring and facet arthropathy. 5. Congenital fusion at C2-3. Electronically Signed   By: Van Clines M.D.   On: 07/01/2018 23:27   Ct Head Wo Contrast  Result Date: 06/30/2018 CLINICAL DATA:  Altered mental status.  Head trauma. EXAM: CT HEAD WITHOUT CONTRAST CT CERVICAL SPINE WITHOUT CONTRAST TECHNIQUE: Multidetector CT imaging of the head and cervical spine was performed following the standard protocol without intravenous contrast. Multiplanar CT image reconstructions of the cervical spine were also generated. COMPARISON:  Report from 09/07/2002 FINDINGS: Despite efforts by the  technologist and patient, motion artifact is present on today's exam and could not be eliminated. This reduces exam sensitivity and specificity. CT HEAD FINDINGS Brain: The brainstem, cerebellum, cerebral peduncles, thalami, basal ganglia, basilar cisterns, and ventricular system appear within normal limits. No intracranial hemorrhage, mass lesion, or acute CVA. Vascular: Unremarkable Skull: Unremarkable Sinuses/Orbits: Mild chronic right maxillary sinusitis. Other: No supplemental non-categorized findings. CT CERVICAL SPINE FINDINGS Alignment: No vertebral subluxation is observed. Skull base and vertebrae: There is congenital fusion at C2-3 between  the vertebral bodies and posterior elements. Prominent loss of articular space anteriorly at C1-2. Schmorl's nodes at C4-5. No appreciable fracture. Soft tissues and spinal canal: No prevertebral soft tissue swelling. Bilateral common carotid atherosclerotic calcification. Disc levels: Facet and uncinate spurring cause foraminal impingement on the right at C3-4, C4-5, C5-6, and C6-7; and on the left at C4-5, C5-6, C6-7, and C7-T1. There is central narrowing of the thecal sac due to spurring at C3-4 and C4-5. Upper chest: Unremarkable Other: No supplemental non-categorized findings. IMPRESSION: 1. No acute intracranial findings or acute cervical spine findings. 2. Impingement at all levels between C3 and T1 due to facet and uncinate spurring. 3. Congenital fusion at C2-3. 4. Atherosclerosis. 5. Mild chronic right maxillary sinusitis. Electronically Signed   By: Van Clines M.D.   On: 06/30/2018 20:50   Ct Cervical Spine Wo Contrast  Result Date: 07/01/2018 CLINICAL DATA:  Altered mental status S. Fall. Urinary tract infection. Confusion. EXAM: CT HEAD WITHOUT CONTRAST CT CERVICAL SPINE WITHOUT CONTRAST TECHNIQUE: Multidetector CT imaging of the head and cervical spine was performed following the standard protocol without intravenous contrast. Multiplanar CT  image reconstructions of the cervical spine were also generated. COMPARISON:  06/30/2018 FINDINGS: Despite efforts by the technologist and patient, motion artifact is present on today's exam and could not be eliminated. This reduces exam sensitivity and specificity. CT HEAD FINDINGS Brain: The brainstem, cerebellum, cerebral peduncles, thalami, basal ganglia, basilar cisterns, and ventricular system appear within normal limits. No intracranial hemorrhage, mass lesion, or acute CVA. Overall no significant intracranial abnormality. Vascular: Unremarkable Skull: Unremarkable Sinuses/Orbits: Chronic right maxillary and ethmoid sinusitis. Other: No supplemental non-categorized findings. CT CERVICAL SPINE FINDINGS Alignment: No vertebral subluxation is observed. Skull base and vertebrae: Reduced sensitivity due to motion, no discrete fracture is identified. Fused C2 and C3 vertebra. Soft tissues and spinal canal: Bilateral common carotid atherosclerotic calcification. Disc levels: Foraminal impingement at all cervical levels due to uncinate spurring and facet arthropathy. Upper chest: Unremarkable Other: Served skip other IMPRESSION: 1. No acute intracranial findings or acute cervical spine findings. 2. Chronic right maxillary and ethmoid sinusitis. 3. Reduced sensitivity due to motion artifact. 4. Foraminal impingement at all cervical levels due to uncinate spurring and facet arthropathy. 5. Congenital fusion at C2-3. Electronically Signed   By: Van Clines M.D.   On: 07/01/2018 23:27   Ct Cervical Spine Wo Contrast  Result Date: 06/30/2018 CLINICAL DATA:  Altered mental status.  Head trauma. EXAM: CT HEAD WITHOUT CONTRAST CT CERVICAL SPINE WITHOUT CONTRAST TECHNIQUE: Multidetector CT imaging of the head and cervical spine was performed following the standard protocol without intravenous contrast. Multiplanar CT image reconstructions of the cervical spine were also generated. COMPARISON:  Report from  09/07/2002 FINDINGS: Despite efforts by the technologist and patient, motion artifact is present on today's exam and could not be eliminated. This reduces exam sensitivity and specificity. CT HEAD FINDINGS Brain: The brainstem, cerebellum, cerebral peduncles, thalami, basal ganglia, basilar cisterns, and ventricular system appear within normal limits. No intracranial hemorrhage, mass lesion, or acute CVA. Vascular: Unremarkable Skull: Unremarkable Sinuses/Orbits: Mild chronic right maxillary sinusitis. Other: No supplemental non-categorized findings. CT CERVICAL SPINE FINDINGS Alignment: No vertebral subluxation is observed. Skull base and vertebrae: There is congenital fusion at C2-3 between the vertebral bodies and posterior elements. Prominent loss of articular space anteriorly at C1-2. Schmorl's nodes at C4-5. No appreciable fracture. Soft tissues and spinal canal: No prevertebral soft tissue swelling. Bilateral common carotid atherosclerotic calcification. Disc levels: Facet and uncinate  spurring cause foraminal impingement on the right at C3-4, C4-5, C5-6, and C6-7; and on the left at C4-5, C5-6, C6-7, and C7-T1. There is central narrowing of the thecal sac due to spurring at C3-4 and C4-5. Upper chest: Unremarkable Other: No supplemental non-categorized findings. IMPRESSION: 1. No acute intracranial findings or acute cervical spine findings. 2. Impingement at all levels between C3 and T1 due to facet and uncinate spurring. 3. Congenital fusion at C2-3. 4. Atherosclerosis. 5. Mild chronic right maxillary sinusitis. Electronically Signed   By: Van Clines M.D.   On: 06/30/2018 20:50   Mr Thoracic Spine Wo Contrast  Result Date: 07/01/2018 CLINICAL DATA:  77 y/o M; worsening back pain, confusion, generalized weakness over the past couple of days. EXAM: MRI THORACIC AND LUMBAR SPINE WITHOUT CONTRAST TECHNIQUE: Multiplanar and multiecho pulse sequences of the thoracic and lumbar spine were obtained  without intravenous contrast. The patient was confused and moving, unable to continue the exam, lumbar axial sequences were not acquired. COMPARISON:  06/29/2018 CT abdomen pelvis. FINDINGS: MRI THORACIC SPINE FINDINGS Alignment:  Physiologic. Vertebrae: No fracture, evidence of discitis, or bone lesion. Cord:  Normal signal and morphology. Paraspinal and other soft tissues: Negative. Disc levels: Mild multilevel disc and facet degenerative changes. No significant foraminal or canal stenosis. MRI LUMBAR SPINE FINDINGS Segmentation:  Standard. Alignment:  Straightening of lumbar lordosis.  No listhesis. Vertebrae: No fracture, evidence of discitis, or bone lesion. Bilateral L3-4 facet edema, greater on the right, with edema in surrounding soft tissues, likely related to degenerative facet arthritis. Conus medullaris and cauda equina: Conus extends to the L1 level. Conus and cauda equina appear normal. Paraspinal and other soft tissues: Negative. Disc levels: Sagittal sequences only. 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 L1-2: No significant disc displacement, foraminal stenosis, or canal stenosis. L2-3: Disc bulge with left-greater-than-right facet hypertrophy. Mild left foraminal stenosis. Moderate to severe spinal canal stenosis. L3-4: Disc bulge with advanced bilateral facet hypertrophy, greater on the right. Moderate bilateral foraminal stenosis. Severe spinal canal stenosis. L4-5: Disc bulge with endplate marginal osteophytes and advanced facet hypertrophy. Moderate right and severe left foraminal stenosis. Mild spinal canal stenosis. L5-S1: Disc bulge with endplate marginal osteophytes and advanced bilateral facet hypertrophy. Moderate to severe bilateral foraminal stenosis. Mild spinal canal stenosis. The disc bulge narrows the bilateral recesses with contact on the descending S1 nerve roots. IMPRESSION: MR THORACIC SPINE IMPRESSION 1. No acute osseous or cord signal abnormality. 2. Mild  multilevel discogenic degenerative changes of the thoracic spine. No foraminal or canal stenosis. MR LUMBAR SPINE IMPRESSION 1. Sagittal sequences only. 2. No acute osseous abnormality or malalignment. 3. 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 4. Bilateral L3-4 facet edema, likely degenerative. 5. Moderate to severe L2-3 and severe L3-4 spinal canal stenosis. Multilevel high-grade foraminal stenosis. Electronically Signed   By: Kristine Garbe M.D.   On: 07/01/2018 03:47   Mr Lumbar Spine Wo Contrast  Result Date: 07/01/2018 CLINICAL DATA:  77 y/o M; worsening back pain, confusion, generalized weakness over the past couple of days. EXAM: MRI THORACIC AND LUMBAR SPINE WITHOUT CONTRAST TECHNIQUE: Multiplanar and multiecho pulse sequences of the thoracic and lumbar spine were obtained without intravenous contrast. The patient was confused and moving, unable to continue the exam, lumbar axial sequences were not acquired. COMPARISON:  06/29/2018 CT abdomen pelvis. FINDINGS: MRI THORACIC SPINE FINDINGS Alignment:  Physiologic. Vertebrae: No fracture, evidence of discitis,  or bone lesion. Cord:  Normal signal and morphology. Paraspinal and other soft tissues: Negative. Disc levels: Mild multilevel disc and facet degenerative changes. No significant foraminal or canal stenosis. MRI LUMBAR SPINE FINDINGS Segmentation:  Standard. Alignment:  Straightening of lumbar lordosis.  No listhesis. Vertebrae: No fracture, evidence of discitis, or bone lesion. Bilateral L3-4 facet edema, greater on the right, with edema in surrounding soft tissues, likely related to degenerative facet arthritis. Conus medullaris and cauda equina: Conus extends to the L1 level. Conus and cauda equina appear normal. Paraspinal and other soft tissues: Negative. Disc levels: Sagittal sequences only. Congenital lumbar spinal canal stenosis  with short pedicles. L1-2: No significant disc displacement, foraminal stenosis, or canal stenosis. L2-3: Disc bulge with left-greater-than-right facet hypertrophy. Mild left foraminal stenosis. Moderate to severe spinal canal stenosis. L3-4: Disc bulge with advanced bilateral facet hypertrophy, greater on the right. Moderate bilateral foraminal stenosis. Severe spinal canal stenosis. L4-5: Disc bulge with endplate marginal osteophytes and advanced facet hypertrophy. Moderate right and severe left foraminal stenosis. Mild spinal canal stenosis. L5-S1: Disc bulge with endplate marginal osteophytes and advanced bilateral facet hypertrophy. Moderate to severe bilateral foraminal stenosis. Mild spinal canal stenosis. The disc bulge narrows the bilateral recesses with contact on the descending S1 nerve roots. IMPRESSION: MR THORACIC SPINE IMPRESSION 1. No acute osseous or cord signal abnormality. 2. Mild multilevel discogenic degenerative changes of the thoracic spine. No foraminal or canal stenosis. MR LUMBAR SPINE IMPRESSION 1. Sagittal sequences only. 2. No acute osseous abnormality or malalignment. 3. 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 4. Bilateral L3-4 facet edema, likely degenerative. 5. Moderate to severe L2-3 and severe L3-4 spinal canal stenosis. Multilevel high-grade foraminal stenosis. Electronically Signed   By: Kristine Garbe M.D.   On: 07/01/2018 03:47      Assessment/Plan Principal Problem:   Acute encephalopathy Active Problems:   Essential hypertension   Dementia (HCC)   Back pain   Acute cystitis with hematuria    1. Acute encephalopathy likely a combination of UTI dementia and some of the pain medications.  Follow urine cultures.  Patient is scheduled to have outpatient physical therapy to come on Monday that is 2 days from now.  May reconsult physical therapy to see if  patient needs inpatient rehab. 2. UTI on ceftriaxone. 3. Hypertension uncontrolled -home medication dosing unclear for now I have kept patient on PRN IV hydralazine. 4. Back pain on pain relief medication which could be contributing to 1. 5. History of dementia -as per the medication list patient takes Namenda and Aricept for which doses are not entered and needs to be checked. 6. During recent admission it was shown to have thrombocytopenia.  Follow CBC.  Please check patient's home dose medication doses and restart as appropriate.   DVT prophylaxis: SCDs due to thrombocytopenia. Code Status: Full code. Family Communication: No family at the bedside. Disposition Plan: To be determined. Consults called: Physical therapy. Admission status: Observation.   Rise Patience MD [facility] Pager [phone].  If 7PM-7AM, please contact night-coverage [email or web address] Password [facility]  07/02/2018, 4:49 AM

## 2018-07-03 DIAGNOSIS — N390 Urinary tract infection, site not specified: Secondary | ICD-10-CM | POA: Diagnosis not present

## 2018-07-03 DIAGNOSIS — M545 Low back pain: Secondary | ICD-10-CM | POA: Diagnosis not present

## 2018-07-03 DIAGNOSIS — Y92009 Unspecified place in unspecified non-institutional (private) residence as the place of occurrence of the external cause: Secondary | ICD-10-CM | POA: Diagnosis not present

## 2018-07-03 DIAGNOSIS — R55 Syncope and collapse: Secondary | ICD-10-CM | POA: Diagnosis not present

## 2018-07-03 DIAGNOSIS — M549 Dorsalgia, unspecified: Secondary | ICD-10-CM | POA: Diagnosis not present

## 2018-07-03 DIAGNOSIS — F05 Delirium due to known physiological condition: Secondary | ICD-10-CM | POA: Diagnosis not present

## 2018-07-03 DIAGNOSIS — F0390 Unspecified dementia without behavioral disturbance: Secondary | ICD-10-CM

## 2018-07-03 DIAGNOSIS — I10 Essential (primary) hypertension: Secondary | ICD-10-CM | POA: Diagnosis present

## 2018-07-03 DIAGNOSIS — R4182 Altered mental status, unspecified: Secondary | ICD-10-CM | POA: Diagnosis not present

## 2018-07-03 DIAGNOSIS — Z9181 History of falling: Secondary | ICD-10-CM | POA: Diagnosis not present

## 2018-07-03 DIAGNOSIS — W19XXXA Unspecified fall, initial encounter: Secondary | ICD-10-CM | POA: Diagnosis present

## 2018-07-03 DIAGNOSIS — M1990 Unspecified osteoarthritis, unspecified site: Secondary | ICD-10-CM | POA: Diagnosis not present

## 2018-07-03 DIAGNOSIS — G9341 Metabolic encephalopathy: Secondary | ICD-10-CM | POA: Diagnosis present

## 2018-07-03 DIAGNOSIS — Z96641 Presence of right artificial hip joint: Secondary | ICD-10-CM | POA: Diagnosis present

## 2018-07-03 DIAGNOSIS — E876 Hypokalemia: Secondary | ICD-10-CM | POA: Diagnosis present

## 2018-07-03 DIAGNOSIS — Z79899 Other long term (current) drug therapy: Secondary | ICD-10-CM | POA: Diagnosis not present

## 2018-07-03 DIAGNOSIS — N3001 Acute cystitis with hematuria: Secondary | ICD-10-CM | POA: Diagnosis present

## 2018-07-03 DIAGNOSIS — R4701 Aphasia: Secondary | ICD-10-CM | POA: Diagnosis not present

## 2018-07-03 DIAGNOSIS — Z79891 Long term (current) use of opiate analgesic: Secondary | ICD-10-CM | POA: Diagnosis not present

## 2018-07-03 DIAGNOSIS — N3281 Overactive bladder: Secondary | ICD-10-CM | POA: Diagnosis not present

## 2018-07-03 DIAGNOSIS — F419 Anxiety disorder, unspecified: Secondary | ICD-10-CM | POA: Diagnosis not present

## 2018-07-03 DIAGNOSIS — M48061 Spinal stenosis, lumbar region without neurogenic claudication: Secondary | ICD-10-CM | POA: Diagnosis present

## 2018-07-03 DIAGNOSIS — Z743 Need for continuous supervision: Secondary | ICD-10-CM | POA: Diagnosis not present

## 2018-07-03 DIAGNOSIS — R41841 Cognitive communication deficit: Secondary | ICD-10-CM | POA: Diagnosis not present

## 2018-07-03 DIAGNOSIS — G4700 Insomnia, unspecified: Secondary | ICD-10-CM | POA: Diagnosis not present

## 2018-07-03 DIAGNOSIS — R296 Repeated falls: Secondary | ICD-10-CM | POA: Diagnosis present

## 2018-07-03 DIAGNOSIS — D696 Thrombocytopenia, unspecified: Secondary | ICD-10-CM | POA: Diagnosis present

## 2018-07-03 DIAGNOSIS — M5489 Other dorsalgia: Secondary | ICD-10-CM | POA: Diagnosis not present

## 2018-07-03 DIAGNOSIS — M5126 Other intervertebral disc displacement, lumbar region: Secondary | ICD-10-CM | POA: Diagnosis present

## 2018-07-03 DIAGNOSIS — Z87891 Personal history of nicotine dependence: Secondary | ICD-10-CM | POA: Diagnosis not present

## 2018-07-03 DIAGNOSIS — R410 Disorientation, unspecified: Secondary | ICD-10-CM | POA: Diagnosis not present

## 2018-07-03 DIAGNOSIS — M5127 Other intervertebral disc displacement, lumbosacral region: Secondary | ICD-10-CM | POA: Diagnosis present

## 2018-07-03 DIAGNOSIS — G9340 Encephalopathy, unspecified: Secondary | ICD-10-CM | POA: Diagnosis not present

## 2018-07-03 DIAGNOSIS — M4696 Unspecified inflammatory spondylopathy, lumbar region: Secondary | ICD-10-CM | POA: Diagnosis not present

## 2018-07-03 DIAGNOSIS — G629 Polyneuropathy, unspecified: Secondary | ICD-10-CM | POA: Diagnosis not present

## 2018-07-03 DIAGNOSIS — R278 Other lack of coordination: Secondary | ICD-10-CM | POA: Diagnosis not present

## 2018-07-03 DIAGNOSIS — G8929 Other chronic pain: Secondary | ICD-10-CM | POA: Diagnosis not present

## 2018-07-03 DIAGNOSIS — R279 Unspecified lack of coordination: Secondary | ICD-10-CM | POA: Diagnosis not present

## 2018-07-03 DIAGNOSIS — M6281 Muscle weakness (generalized): Secondary | ICD-10-CM | POA: Diagnosis not present

## 2018-07-03 LAB — URINE CULTURE: Culture: >=100000 — AB

## 2018-07-03 LAB — BASIC METABOLIC PANEL
Anion gap: 13 (ref 5–15)
BUN: 17 mg/dL (ref 8–23)
CO2: 23 mmol/L (ref 22–32)
CREATININE: 0.67 mg/dL (ref 0.61–1.24)
Calcium: 8.4 mg/dL — ABNORMAL LOW (ref 8.9–10.3)
Chloride: 98 mmol/L (ref 98–111)
GFR calc Af Amer: >60 mL/min (ref >60)
GFR calc non Af Amer: >60 mL/min (ref >60)
Glucose, Bld: 141 mg/dL — ABNORMAL HIGH (ref 70–99)
Potassium: 3.3 mmol/L — ABNORMAL LOW (ref 3.5–5.1)
Sodium: 134 mmol/L — ABNORMAL LOW (ref 135–145)

## 2018-07-03 LAB — CBC
HCT: 39 % (ref 39.0–52.0)
Hemoglobin: 13.1 g/dL (ref 13.0–17.0)
MCH: 32.9 pg (ref 26.0–34.0)
MCHC: 33.6 g/dL (ref 30.0–36.0)
MCV: 98 fL (ref 80.0–100.0)
Platelets: 115 10*3/uL — ABNORMAL LOW (ref 150–400)
RBC: 3.98 MIL/uL — ABNORMAL LOW (ref 4.22–5.81)
RDW: 12.3 % (ref 11.5–15.5)
WBC: 6.3 10*3/uL (ref 4.0–10.5)
nRBC: 0 % (ref 0.0–0.2)

## 2018-07-03 MED ORDER — DONEPEZIL HCL 10 MG PO TABS
10.0000 mg | ORAL_TABLET | Freq: Every day | ORAL | Status: DC
  Administered 2018-07-03: 10 mg via ORAL
  Filled 2018-07-03: qty 1

## 2018-07-03 MED ORDER — IRBESARTAN 75 MG PO TABS
150.0000 mg | ORAL_TABLET | Freq: Every day | ORAL | Status: DC
  Administered 2018-07-03 – 2018-07-04 (×2): 150 mg via ORAL
  Filled 2018-07-03 (×2): qty 2

## 2018-07-03 MED ORDER — HYDROCHLOROTHIAZIDE 25 MG PO TABS
25.0000 mg | ORAL_TABLET | Freq: Every day | ORAL | Status: DC
  Administered 2018-07-03 – 2018-07-04 (×2): 25 mg via ORAL
  Filled 2018-07-03 (×2): qty 1

## 2018-07-03 MED ORDER — HYDROCODONE-ACETAMINOPHEN 7.5-325 MG PO TABS: 1.0000 | ORAL_TABLET | Freq: Four times a day (QID) | ORAL | 0 refills | Status: AC | PRN

## 2018-07-03 MED ORDER — PREGABALIN 75 MG PO CAPS
75.0000 mg | ORAL_CAPSULE | Freq: Three times a day (TID) | ORAL | Status: DC
  Administered 2018-07-03 – 2018-07-04 (×4): 75 mg via ORAL
  Filled 2018-07-03 (×4): qty 1

## 2018-07-03 MED ORDER — SODIUM CHLORIDE 0.9 % IV SOLN
INTRAVENOUS | Status: DC | PRN
  Administered 2018-07-03: 250 mL via INTRAVENOUS

## 2018-07-03 MED ORDER — MEMANTINE HCL 10 MG PO TABS
10.0000 mg | ORAL_TABLET | Freq: Two times a day (BID) | ORAL | Status: DC
  Administered 2018-07-03 – 2018-07-04 (×3): 10 mg via ORAL
  Filled 2018-07-03 (×5): qty 1

## 2018-07-03 NOTE — NC FL2 (Signed)
[place] LEVEL OF CARE SCREENING TOOL     IDENTIFICATION  Patient Name: Ricky Black Birthdate: 01-14-1941 Sex: male Admission Date (Current Location): 07/01/2018  [facility] and [place] Number:  Herbalist and Address:  The . [facility], [place] [address] [place], [place] [address]      Provider Number: [id number]  Attending Physician Name and Address:  Cristal Ford, DO  Relative Name and Phone Number:       Current Level of Care: Hospital Recommended Level of Care: [place] Prior Approval Number:    Date Approved/Denied:   PASRR Number:   [id number]   Discharge Plan: SNF    Current Diagnoses: Patient Active Problem List   Diagnosis Date Noted  . Acute encephalopathy 07/02/2018  . Acute cystitis with hematuria 07/02/2018  . Back pain 07/01/2018  . Delirium 07/01/2018  . Hematuria, microscopic 07/01/2018  . Bradycardia   . Dizziness   . Orthostatic hypotension   . Spinal arthritis   . Neuropathy   . Hypogonadism male   . Dementia ([place])   . Abnormal EKG   . Essential hypertension 05/10/2007  . ARTHRITIS 05/10/2007    Orientation RESPIRATION BLADDER Height & Weight     (pt has hx of dementia)  Normal Incontinent Weight: 249 lb 5.4 oz (113.1 kg) Height:     BEHAVIORAL SYMPTOMS/MOOD NEUROLOGICAL BOWEL NUTRITION STATUS      Incontinent Diet(please see discharge summary. )  AMBULATORY STATUS COMMUNICATION OF NEEDS Skin   Extensive Assist Verbally Normal                       Personal Care Assistance Level of Assistance  Bathing, Feeding, Dressing Bathing Assistance: Maximum assistance Feeding assistance: Limited assistance Dressing Assistance: Maximum assistance     Functional Limitations Info  Sight, Hearing, Speech Sight Info: Adequate Hearing Info: Adequate Speech Info: Adequate    SPECIAL CARE FACTORS FREQUENCY  PT (By licensed PT), OT (By licensed OT)     PT Frequency: 5  times a week OT Frequency: 5 times a week             Contractures Contractures Info: Not present    Additional Factors Info  Code Status, Allergies Code Status Info: Full  Allergies Info: NKA           Current Medications (07/03/2018):  This is the current hospital active medication list Current Facility-Administered Medications  Medication Dose Route Frequency Provider Last Rate Last Dose  . acetaminophen (TYLENOL) tablet 650 mg  650 mg Oral Q6H PRN Rise Patience, MD       Or  . acetaminophen (TYLENOL) suppository 650 mg  650 mg Rectal Q6H PRN Rise Patience, MD      . bethanechol (URECHOLINE) tablet 25 mg  25 mg Oral Daily Rise Patience, MD   25 mg at 07/03/18 1056  . cefTRIAXone (ROCEPHIN) 1 g in sodium chloride 0.9 % 100 mL IVPB  1 g Intravenous Q24H Rise Patience, MD 200 mL/hr at 07/02/18 1730 1 g at 07/02/18 1730  . cyclobenzaprine (FLEXERIL) tablet 5 mg  5 mg Oral BID PRN Rise Patience, MD   5 mg at 07/03/18 1056  . donepezil (ARICEPT) tablet 10 mg  10 mg Oral QHS Mikhail, [place], DO      . finasteride (PROSCAR) tablet 5 mg  5 mg Oral Daily Rise Patience, MD   5 mg at 07/03/18 1056  .  hydrALAZINE (APRESOLINE) injection 5 mg  5 mg Intravenous Q4H PRN Rise Patience, MD   5 mg at 07/02/18 1229  . hydrochlorothiazide (HYDRODIURIL) tablet 25 mg  25 mg Oral Daily Mikhail, Velta [place], DO      . HYDROcodone-acetaminophen (NORCO) 7.5-325 MG per tablet 1 tablet  1 tablet Oral Q6H PRN Rise Patience, MD   1 tablet at 07/03/18 1056  . irbesartan (AVAPRO) tablet 150 mg  150 mg Oral Daily Mikhail, Velta [place], DO      . memantine [facility]) tablet 10 mg  10 mg Oral BID Cristal Ford, DO      . ondansetron [facility]) tablet 4 mg  4 mg Oral Q6H PRN Rise Patience, MD       Or  . ondansetron [facility]) injection 4 mg  4 mg Intravenous Q6H PRN Rise Patience, MD      . pregabalin (LYRICA) capsule 75 mg  75 mg Oral TID Cristal Ford, DO      . tamsulosin (FLOMAX) capsule 0.8 mg  0.8 mg Oral QHS Rise Patience, MD   0.8 mg at 07/02/18 2144     Discharge Medications: Please see discharge summary for a list of discharge medications.  Relevant Imaging Results:  Relevant Lab Results:   Additional Information [id number].   Wetzel Bjornstad, LCSWA

## 2018-07-03 NOTE — Progress Notes (Addendum)
Update:  [place]   All do not have any beds available for 07/03/18.     07/03/18:  CSW received a phone call from patient's wife. She is okay with sending her husband's information to the following facilities:  [facility] has sent out the patient's initial referral and FL2. CSW will call facilities to see if anyone has an open bed.

## 2018-07-03 NOTE — Progress Notes (Signed)
    Durable Medical Equipment  (From admission, onward)         Start     Ordered   07/03/18 0707  For home use only DME Walker  Once    Comments:  ?less than 23in in width  Question:  Patient needs a walker to treat with the following condition  Answer:  Fall   07/03/18 0707   07/03/18 0706  For home use only DME Shower stool  Once     07/03/18 1855   07/03/18 0705  For home use only DME Hospital bed  Once    Question Answer Comment  The above medical condition requires: Patient requires the ability to reposition frequently   Bed type Semi-electric      07/03/18 0707   07/03/18 0705  For home use only DME Bedside commode  Once    Question:  Patient needs a bedside commode to treat with the following condition  Answer:  Fall   07/03/18 0707   07/03/18 0705  For home use only DME lightweight manual wheelchair with seat cushion  Once    Comments:  Patient suffers from frequent falls, which impairs their ability to perform daily activities in the home.  A walker will not resolve issue with performing activities of daily living. A wheelchair will allow patient to safely perform daily activities. Patient is not able to propel themselves in the home using a standard weight wheelchair due to weakness. Patient can self propel in the lightweight wheelchair.  Accessories: elevating leg rests (ELRs), wheel locks, extensions and anti-tippers.   07/03/18 0158

## 2018-07-03 NOTE — Progress Notes (Signed)
07/03/18:    CSW met with the daughter Claiborne Billings and patient in the room at bedside. Patient's daughter is willing to have her father go to a skilled nursing facility.   CSW provided a list of facilities. Claiborne Billings stated that she would prefer her father go to [facility]. CSW stated that they would follow up to make sure that they have the available room.   CSW will follow up with patient's wife.   Domenic Schwab, MSW, [place]

## 2018-07-03 NOTE — Progress Notes (Signed)
Physical Therapy Treatment Patient Details Name: Ricky Black MRN: [id number] DOB: June 10, 1941 Today's Date: 07/03/2018    History of Present Illness Ricky Black is a 77 y.o. male with history of dementia hypertension who was admitted and discharged yesterday after being admitted for back pain confusion and UTI was brought to the ER after patient's wife called EMS since patient had a fall at home.  PMH: dementia, HTN, chronic back pain on chronic hydrocodone (per wife), and s/p L5 Laminectomy. Baseline dementia with heavy short term memory impairment, but not difficulty with recognizing family.    PT Comments    Pt continues to present with moderate limitations to functional mobility due to pain, weakness, limited tolerance to sustained activity, complicated by cognitive dysfunction, and resulting in dependencies for basic mobility task, moving around, and changing positions.  Caregiver is unwilling/unable to provide assistance sufficient for safe d/c home, and pt continues to require therapy intervention to address deficits and progress independence.  Recommend CSW to assist with SNF placement for rehab needs at d/c.  PT will continue with plan of care while in hospital.     Follow Up Recommendations  SNF(caregiver unwilling/unable to provide necessary assist )     Equipment Recommendations  None recommended by PT(defer to next venue prior to d/c home)    Recommendations for Other Services       Precautions / Restrictions Precautions Precautions: Fall Precaution Comments: increasing falls at home Restrictions Weight Bearing Restrictions: No    Mobility  Bed Mobility Overal bed mobility: Needs Assistance Bed Mobility: Supine to Sit;Sit to Supine;Rolling Rolling: Min assist(used bed pad to assist and support back (painful))   Supine to sit: Min assist;HOB elevated Sit to supine: Min assist   General bed mobility comments: increased effort due to back pain and needs  instruction for safest/least painful technique; more difficulty raising legs to bed surface  Transfers Overall transfer level: Needs assistance Equipment used: Rolling walker (2 wheeled) Transfers: Sit to/from Stand Sit to Stand: Min assist;From elevated surface         General transfer comment: needs multiple attempts and elevated surface to rise to RW, with some assist for steady and lift off/descent  Ambulation/Gait Ambulation/Gait assistance: Min guard Gait Distance (Feet): 50 Feet Assistive device: Rolling walker (2 wheeled) Gait Pattern/deviations: Step-through pattern;Decreased stride length;Antalgic;Trunk flexed;Narrow base of support     General Gait Details: clearly dependent on device for stability and pain management, willing to walk further but limited by pain in back; slow gait and flex trunk noted;    Stairs             Wheelchair Mobility    Modified Rankin (Stroke Patients Only)       Balance Overall balance assessment: Needs assistance Sitting-balance support: No upper extremity supported;Feet supported Sitting balance-Leahy Scale: Good     Standing balance support: Bilateral upper extremity supported;During functional activity Standing balance-Leahy Scale: Poor Standing balance comment: dependent on RW for static and dynamic activity                            Cognition Arousal/Alertness: Awake/alert Behavior During Therapy: WFL for tasks assessed/performed Overall Cognitive Status: History of cognitive impairments - at baseline                                 General Comments: cooperative and pleasant, willing to work  hard,       Exercises      General Comments        Pertinent Vitals/Pain Pain Assessment: 0-10 Pain Score: 8  Pain Location: back Pain Descriptors / Indicators: Aching Pain Intervention(s): Limited activity within patient's tolerance;Monitored during session;Repositioned;Ice applied     Home Living                      Prior Function            PT Goals (current goals can now be found in the care plan section) Acute Rehab PT Goals Patient Stated Goal: get back to being more independent PT Goal Formulation: With patient/family Time For Goal Achievement: 07/09/18 Potential to Achieve Goals: Good Progress towards PT goals: Progressing toward goals    Frequency    Min 3X/week      PT Plan Discharge plan needs to be updated    Co-evaluation              AM-PAC PT "6 Clicks" Mobility   Outcome Measure  Help needed turning from your back to your side while in a flat bed without using bedrails?: A Lot Help needed moving from lying on your back to sitting on the side of a flat bed without using bedrails?: A Lot Help needed moving to and from a bed to a chair (including a wheelchair)?: A Little Help needed standing up from a chair using your arms (e.g., wheelchair or bedside chair)?: A Lot Help needed to walk in hospital room?: A Little Help needed climbing 3-5 steps with a railing? : A Lot 6 Click Score: 14    End of Session Equipment Utilized During Treatment: Gait belt Activity Tolerance: Patient limited by pain Patient left: in bed;with call bell/phone within reach;with family/visitor present Nurse Communication: Mobility status PT Visit Diagnosis: Unsteadiness on feet (R26.81);Muscle weakness (generalized) (M62.81);History of falling (Z91.81);Repeated falls (R29.6)     Time: 1027-2536 PT Time Calculation (min) (ACUTE ONLY): 28 min  Charges:  $Gait Training: 8-22 mins $Therapeutic Activity: 8-22 mins                    Kearney Hard, PT, DPT, MS Board Certified Geriatric Clinical Specialist   Herbie Drape 07/03/2018, 12:09 PM

## 2018-07-03 NOTE — Clinical Social Work Note (Signed)
Clinical Social Work Assessment  Patient Details  Name: Ricky Black MRN: [id number] Date of Birth: 1940/07/29  Date of referral:  07/03/18               Reason for consult:  Discharge Planning, Facility Placement                Permission sought to share information with:  Family Supports Permission granted to share information::  Yes, Verbal Permission Granted  Name::     Ricky Black   Agency::  family  Relationship::  spouse   Contact Information:  Ricky Black [phone]  Housing/Transportation Living arrangements for the past 2 months:  Single Family Home(with spouse. ) Source of Information:  Spouse Patient Interpreter Needed:  None Criminal Activity/Legal Involvement Pertinent to Current Situation/Hospitalization:  No - Comment as needed Significant Relationships:  Adult Children, Spouse Lives with:  Spouse Do you feel safe going back to the place where you live?  No(per wife. ) Need for family participation in patient care:  Yes (Comment)  Care giving concerns:  CSW consulted as CSW made aware that pt's wife is refusing to take pt back home with home health services that were suggested by PT.   Social Worker assessment / plan:  CSW's  went to speak with pt's wife at bedside. CSW's met pt's daughter Ricky Black at bedside and was informed that CSW's should speak with pt's wife Ricky Black. CSW's agreeable and called pt's wife once answering questions of pt's daughter. CSW's spoke with pt's wife Ricky Black via phone. During this time CSW's goals were to give update on discharge planning needs. CSW's began conversation by expressing to pt's wife that CSW's were calling to address further needs of SNF placement as CSW's made aware that pt's wife was interested in pt going to SNF for placement. CSW's attempted to give updates and present list to pt's wife of available facilities since pt has not had a three night qualifying inpt stay. Pt's wife Ricky Black immediatly became very aggressive,  yelling, an d speaking over CSW's while attempting to ensure that Ricky Black understood what was taking place. CSW's allowed Ricky Black to speak however CSW's were constantly cut off while attempting to explain placment to wife.   Ricky Black expressed that she need pt's PASAR in order to get placement. CSW advised Ricky Black that this is true, however pt's insurance would be the determining factor in whether insurance would cover the cost of SNF placement. Wife again began to yell at CSW's that if pt was admitted for three days then pt could go to [facility] as she wants pt. CSW advised her that [facility] is not on the list of 3 day SNF waivers however CSW did try and redirect the conversation by telling Ricky Black what options pt's may have. Ricky Black still yelling and telling CSW's that she will speak with other family members as well as she would speak with MD. CSW's ended conversation with Ricky Black by asking for permission to send information out to facilities that pt has access to and [name] expressed "No I will tell you were to send it". CSW's politely  explanted that since CSW's are not able to send information out to possible facilities then pt may be discharge back home with her for home health services.   Employment status:  Retired Forensic scientist:  Medicare PT Recommendations:  Home with Home Health(at this time. ) Information / Referral to community resources:  [place]  Patient/Family's Response to care:  Pt's  wife is very update an demanding that pt remain in the hospital for three night qualifying stay so that pt can go to [facility].   Patient/Family's Understanding of and Emotional Response to Diagnosis, Current Treatment, and Prognosis:  Understanding of care for pt's wife is very unclear. Wife appears to want facilities that are not contracted with the three day waiver form. Wife expressed being angry and displease with MD and staff in general as she feels that hospital staff has not done  there part. CSW offered understanding to [name] however she still remained upset.  Emotional Assessment Appearance:  Appears stated age Attitude/Demeanor/Rapport:  Unable to Assess Affect (typically observed):  Unable to Assess Orientation:  (pt has a history of dementia. ) Alcohol / Substance use:  Not Applicable Psych involvement (Current and /or in the community):  No (Comment)  Discharge Needs  Concerns to be addressed:  Patient refuses services(pt's wife is refusing any placement into avaliable facilities that CSW offers throught he 3 nday SNF waiver. ) Readmission within the last 30 days:  No Current discharge risk:  Dependent with Mobility Barriers to Discharge:  Continued Medical Work up   [employer], [place] 07/03/2018, 12:14 PM

## 2018-07-03 NOTE — Progress Notes (Addendum)
09:45 Left HIPPA compliant message for patient's wife to discuss home care needs. Name Relation Home Work [place] Spouse [phone]  [phone]   10:10 Spoke w patient's daughter at bedside. Discussed DME, added hoyer lift to orders. Interested in narrow RW (<23" across), will check w [facility]. Daughter also interested in SNF. Spoke w CSW to discuss [facility] override for SNF w daughter, anticipate DC today. Patient is active w [facility]. However if DC's to home would benefit more from [facility] services provided through [facility] program. Will continue to follow. 11:10  Spoke w patient's wife on speaker phone in room w daughter. She granted permission to provide HPI to both of patient's daughters, but clarified that disposition choices will be made by her. CM requested again for CSW to speak to patient/ family/ wife about [facility] possibilities for SNF. Wife aggressively stating that patient will have to be made inpatient and stay for 72 hours and that she is refusing to take him home. Attempt to reassure her that every effort was being made to place him if possible, was not successful she continued to restate that she refused to take him home. Reviewed MOON letter over the phone, did not go over that she does not have the right to appeal discharge in obs status at this time as she was already agitated.  11:30 Notified UR team to assess if patient would meet Medicare criteria for inpatient. A UR nurse will follow up with attending.

## 2018-07-03 NOTE — Progress Notes (Signed)
07/03/18  CSW called and spoke to patient's wife about possible SNF facilities for her husband. Patient's wife was upset that the hospital had discharged her husband home three different times. She stated that she does not want her husband to come home due to it not being safe. She would like for her husband to go to a SNF facility. CSW stated that she understood her frustration.   Patient's wife was upset and very argumentative that her husband could not go to the [facility] SNF. CSW handed the phone to [place] so that she could explain the situation better.   Wife expressed anger and was displeased that the the MD would be sending her husband home. Wife again stated that she would need a PASSAR number. CSW Jeanette Caprice explained that we do not send out patient information, she would need to request that information from medical records.   CSW Saddie Benders went over the list again. [place] is not able to take her husband at this time due to not having a bed available.  CSW shared this information with the case manager and CSW will need to follow up with the family to see if they have come to a decision. Patient's wife did not give CSW to send out her husband's information.   Domenic Schwab, MSW, [place]

## 2018-07-03 NOTE — Care Management Obs Status (Signed)
MEDICARE OBSERVATION STATUS NOTIFICATION   Patient Details  Name: Ricky Black MRN: [id number] Date of Birth: [birth year]   Medicare Observation Status Notification Given:  Yes(discussed over the phone with [place], [place] Spouse [phone]  [phone] )    Carles Collet, RN 07/03/2018, 11:28 AM

## 2018-07-03 NOTE — Progress Notes (Addendum)
PROGRESS NOTE    Ricky Black  KGM:[id number] DOB: Sep 03, 1940 DOA: 07/01/2018 PCP: Marton Redwood, MD   Brief Narrative:  HPI on 07/02/2018 by Dr. Gean Birchwood Ricky Black is a 77 y.o. male with history of dementia hypertension who was admitted and discharged yesterday after being admitted for back pain confusion and UTI was brought to the ER after patient's wife called EMS since patient had a fall at home.  Is not clear if patient had a fall or not since patient does not recall and has dementia.  Patient's wife stated to the EMS that patient was about to fall and had to be eased onto the floor.  Patient has been having some difficulty walking since.  Also found to be confused. Assessment & Plan   Acute metabolic encephalopathy -Suspect combination of urinary tract infection with dementia and possibly pain medications -Continue to treat UTI and monitor mental status -Patient continues to remain confused -CT head no acute intracranial findings -Discussed with patient's daughter, she states patient appears to better than described by patient's wife.  Urinary tract infection -Patient recently discharged 07/01/2018.   -UA 12/13: showed few bacteria, small leukocytes, positive nitrates,>WBC: he was given script for Cipro -urine culture 12/11/219 showed no growth -Urine culture 06/30/2018 showed greater than 100,000 staph aureus.  Resistant to Bactrim -Urine culture from 07/01/2018 60K Staph Aureus- sensitivities pending -Continue ceftriaxone -Upon review of patient's chart, feel that staph aureus is worrisome.  I would have suspected that given antibiotics as well as being discharged with Cipro would have eradicated infection however patient continues to have persistent staph aureus and repeat urine culture on 1213.  Therefore will obtain blood cultures. -Discussed with pharmacy, would place on nitrofurantoin upon discharge as this has better urine penetration.  Essential  hypertension -Patient has multiple antihypertensive medications however no dosages are noted.  Currently on IV hydralazine -will restart home meds: HCTZ and ARB  Back pain -Patient has been getting pain medication which could be contributing to some of his confusion -Patient was recently admitted and discharged on 07/01/2018.  He had undergone MRI of the lumbar spine which showed lumbar spinal canal stenosis with degenerative changes.  MRI thoracic spine showed multilevel disc and facet degenerative changes.  He was seen by physical therapy and home health was recommended.  Dementia -Restarted Namenda and Aricept  Thrombocytopenia -?  unknown etiology -Platelets currently 94, pending CBC  Hypokalemia -Replaced, pending BMP  Frequent falls -Patient was seen by physical therapy, recommended home health -Discussed with patient's daughter, she would like patient to have equipment at home which would help keep the patient from falling such as hospital bed, bedside commode, shower bench etc. -Orders have been placed, case manager has been consulted. -Daughter now wanting to see if patient would be a SNF candidate-social work consulted -Given that patient has been readmitted in the last 24 hours, feel that patient is high risk of going home either without equipment or would benefit from SNF placement.  He lives at home with his wife who is unable to care for him properly without certain equipment in place.  DVT Prophylaxis  SCDs  Code Status: Full  Family Communication: None at bedside  Disposition Plan: Observation, likely discharge to home with [facility] PT vs SNF.  Pending urine culture sensitivities from 12/13 as well as blood cultures.  Consultants None  Procedures  None  Antibiotics   Anti-infectives (From admission, onward)   Start     Dose/Rate Route Frequency Ordered  Stop   07/02/18 1800  cefTRIAXone (ROCEPHIN) 1 g in sodium chloride 0.9 % 100 mL IVPB     1 g 200 mL/hr over 30  Minutes Intravenous Every 24 hours 07/02/18 0448     07/02/18 0045  cefTRIAXone (ROCEPHIN) 1 g in sodium chloride 0.9 % 100 mL IVPB     1 g 200 mL/hr over 30 Minutes Intravenous  Once 07/02/18 0044 07/02/18 0134      Subjective:   Ricky Black seen and examined today.  Patient still confused. Has no complaints.  Thinks he is at home.  Objective:   Vitals:   07/02/18 1651 07/02/18 2119 07/03/18 0445 07/03/18 1000  BP: (!) 142/83 (!) 152/79 (!) 119/51 (!) 150/72  Pulse: 67 64 (!) 59 68  Resp: 20 18 20 19   Temp: 98.3 F (36.8 C) 98.8 F (37.1 C) 98.4 F (36.9 C) 97.9 F (36.6 C)  TempSrc: Oral Oral  Oral  SpO2: 97% 97% 96% 99%  Weight:   113.1 kg     Intake/Output Summary (Last 24 hours) at 07/03/2018 1102 Last data filed at 07/03/2018 1040 Gross per 24 hour  Intake 900 ml  Output 800 ml  Net 100 ml   Filed Weights   07/02/18 0414 07/02/18 1509 07/03/18 0445  Weight: 112 kg 112 kg 113.1 kg    Exam  General: Well developed, well nourished, NAD, appears stated age  44: NCAT, mucous membranes moist.   Neck: Supple  Cardiovascular: S1 S2 auscultated, RRR  Respiratory: Clear to auscultation bilaterally with equal chest rise  Abdomen: Soft, nontender, nondistended, + bowel sounds  Extremities: warm dry without cyanosis clubbing or edema  Neuro: AAOx1 (self only) nonfocal.  Dementia.  Psych: Appropriate mood and affect, pleasant   Data Reviewed: I have personally reviewed following labs and imaging studies  CBC: Recent Labs  Lab 06/29/18 1511 06/30/18 1926 07/01/18 2324 07/02/18 0516  WBC 10.4 8.2 6.4 6.2  NEUTROABS  --  7.3 5.5  --   HGB 14.6 14.3 13.8 13.6  HCT 45.2 43.0 40.6 42.1  MCV 102.5* 101.4* 100.7* 101.0*  PLT 132* 99* PLATELET CLUMPS NOTED ON SMEAR, COUNT APPEARS DECREASED 94*   Basic Metabolic Panel: Recent Labs  Lab 06/29/18 1511 06/30/18 1926 07/01/18 2324 07/02/18 0516  NA 137 139 138 138  K 4.2 3.4* 3.3* 3.4*  CL 103 100  100 99  CO2 23 27 25 27   GLUCOSE 201* 144* 151* 136*  BUN 22 18 19 17   CREATININE 1.10 0.96 0.87 0.77  CALCIUM 9.7 9.3 8.6* 8.5*   GFR: CrCl cannot be calculated (Unknown ideal weight.). Liver Function Tests: Recent Labs  Lab 06/29/18 1511 06/30/18 1926  AST 30 33  ALT 37 31  ALKPHOS 43 35*  BILITOT 0.9 1.2  PROT 6.8 6.5  ALBUMIN 4.3 3.9   No results for input(s): LIPASE, AMYLASE in the last 168 hours. Recent Labs  Lab 07/01/18 0153  AMMONIA 25   Coagulation Profile: No results for input(s): INR, PROTIME in the last 168 hours. Cardiac Enzymes: Recent Labs  Lab 07/01/18 0153  CKTOTAL 665*   BNP (last 3 results) No results for input(s): PROBNP in the last 8760 hours. HbA1C: No results for input(s): HGBA1C in the last 72 hours. CBG: No results for input(s): GLUCAP in the last 168 hours. Lipid Profile: No results for input(s): CHOL, HDL, LDLCALC, TRIG, CHOLHDL, LDLDIRECT in the last 72 hours. Thyroid Function Tests: No results for input(s): TSH, T4TOTAL, FREET4, T3FREE, THYROIDAB  in the last 72 hours. Anemia Panel: No results for input(s): VITAMINB12, FOLATE, FERRITIN, TIBC, IRON, RETICCTPCT in the last 72 hours. Urine analysis:    Component Value Date/Time   COLORURINE YELLOW 07/01/2018 2338   APPEARANCEUR HAZY (A) 07/01/2018 2338   LABSPEC 1.025 07/01/2018 2338   PHURINE 6.0 07/01/2018 2338   GLUCOSEU NEGATIVE 07/01/2018 2338   HGBUR LARGE (A) 07/01/2018 2338   BILIRUBINUR NEGATIVE 07/01/2018 2338   KETONESUR 5 (A) 07/01/2018 2338   PROTEINUR 100 (A) 07/01/2018 2338   NITRITE POSITIVE (A) 07/01/2018 2338   LEUKOCYTESUR SMALL (A) 07/01/2018 2338   Sepsis Labs: @LABRCNTIP (procalcitonin:4,lacticidven:4)  ) Recent Results (from the past 240 hour(s))  Urine culture     Status: None   Collection Time: 06/29/18  7:06 PM  Result Value Ref Range Status   Specimen Description URINE, CLEAN CATCH  Final   Special Requests NONE  Final   Culture   Final    NO  GROWTH Performed at [facility], [place] [address] [place], [place] [address]    Report Status 07/01/2018 FINAL  Final  Urine culture     Status: Abnormal   Collection Time: 06/30/18  9:39 PM  Result Value Ref Range Status   Specimen Description URINE, CLEAN CATCH  Final   Special Requests   Final    NONE Performed at [facility], [place] [address] [place], [place] [address]    Culture >=100,000 COLONIES/mL STAPHYLOCOCCUS AUREUS (A)  Final   Report Status 07/03/2018 FINAL  Final   Organism ID, Bacteria STAPHYLOCOCCUS AUREUS (A)  Final      Susceptibility   Staphylococcus aureus - MIC*    CIPROFLOXACIN <=0.5 SENSITIVE Sensitive     GENTAMICIN <=0.5 SENSITIVE Sensitive     NITROFURANTOIN <=16 SENSITIVE Sensitive     OXACILLIN 0.5 SENSITIVE Sensitive     TETRACYCLINE <=1 SENSITIVE Sensitive     VANCOMYCIN <=0.5 SENSITIVE Sensitive     TRIMETH/SULFA 160 RESISTANT Resistant     CLINDAMYCIN <=0.25 SENSITIVE Sensitive     RIFAMPIN <=0.5 SENSITIVE Sensitive     Inducible Clindamycin NEGATIVE Sensitive     * >=100,000 COLONIES/mL STAPHYLOCOCCUS AUREUS  Urine culture     Status: Abnormal (Preliminary result)   Collection Time: 07/01/18 11:31 PM  Result Value Ref Range Status   Specimen Description URINE, CLEAN CATCH  Final   Special Requests NONE  Final   Culture (A)  Final    60,000 COLONIES/mL STAPHYLOCOCCUS AUREUS CULTURE REINCUBATED FOR BETTER GROWTH Performed at [facility], [place] [address] [place], [place] [address]    Report Status PENDING  Incomplete      Radiology Studies: Dg Chest 1 View  Result Date: 07/01/2018 CLINICAL DATA:  Fall. EXAM: CHEST  1 VIEW COMPARISON:  Chest radiograph August 07, 2005 FINDINGS: The cardiac silhouette is upper limits of normal in size. Mediastinal silhouette is not suspicious. No pleural effusion or focal consolidation. No pneumothorax. Soft tissue planes and included osseous structures are nonacute. IMPRESSION: Borderline  cardiomegaly, no acute pulmonary process. Electronically Signed   By: Elon Alas M.D.   On: 07/01/2018 23:05   Ct Head Wo Contrast  Result Date: 07/01/2018 CLINICAL DATA:  Altered mental status S. Fall. Urinary tract infection. Confusion. EXAM: CT HEAD WITHOUT CONTRAST CT CERVICAL SPINE WITHOUT CONTRAST TECHNIQUE: Multidetector CT imaging of the head and cervical spine was performed following the standard protocol without intravenous contrast. Multiplanar CT image reconstructions of the cervical spine were also  generated. COMPARISON:  06/30/2018 FINDINGS: Despite efforts by the technologist and patient, motion artifact is present on today's exam and could not be eliminated. This reduces exam sensitivity and specificity. CT HEAD FINDINGS Brain: The brainstem, cerebellum, cerebral peduncles, thalami, basal ganglia, basilar cisterns, and ventricular system appear within normal limits. No intracranial hemorrhage, mass lesion, or acute CVA. Overall no significant intracranial abnormality. Vascular: Unremarkable Skull: Unremarkable Sinuses/Orbits: Chronic right maxillary and ethmoid sinusitis. Other: No supplemental non-categorized findings. CT CERVICAL SPINE FINDINGS Alignment: No vertebral subluxation is observed. Skull base and vertebrae: Reduced sensitivity due to motion, no discrete fracture is identified. Fused C2 and C3 vertebra. Soft tissues and spinal canal: Bilateral common carotid atherosclerotic calcification. Disc levels: Foraminal impingement at all cervical levels due to uncinate spurring and facet arthropathy. Upper chest: Unremarkable Other: Served skip other IMPRESSION: 1. No acute intracranial findings or acute cervical spine findings. 2. Chronic right maxillary and ethmoid sinusitis. 3. Reduced sensitivity due to motion artifact. 4. Foraminal impingement at all cervical levels due to uncinate spurring and facet arthropathy. 5. Congenital fusion at C2-3. Electronically Signed   By: Van Clines M.D.   On: 07/01/2018 23:27   Ct Cervical Spine Wo Contrast  Result Date: 07/01/2018 CLINICAL DATA:  Altered mental status S. Fall. Urinary tract infection. Confusion. EXAM: CT HEAD WITHOUT CONTRAST CT CERVICAL SPINE WITHOUT CONTRAST TECHNIQUE: Multidetector CT imaging of the head and cervical spine was performed following the standard protocol without intravenous contrast. Multiplanar CT image reconstructions of the cervical spine were also generated. COMPARISON:  06/30/2018 FINDINGS: Despite efforts by the technologist and patient, motion artifact is present on today's exam and could not be eliminated. This reduces exam sensitivity and specificity. CT HEAD FINDINGS Brain: The brainstem, cerebellum, cerebral peduncles, thalami, basal ganglia, basilar cisterns, and ventricular system appear within normal limits. No intracranial hemorrhage, mass lesion, or acute CVA. Overall no significant intracranial abnormality. Vascular: Unremarkable Skull: Unremarkable Sinuses/Orbits: Chronic right maxillary and ethmoid sinusitis. Other: No supplemental non-categorized findings. CT CERVICAL SPINE FINDINGS Alignment: No vertebral subluxation is observed. Skull base and vertebrae: Reduced sensitivity due to motion, no discrete fracture is identified. Fused C2 and C3 vertebra. Soft tissues and spinal canal: Bilateral common carotid atherosclerotic calcification. Disc levels: Foraminal impingement at all cervical levels due to uncinate spurring and facet arthropathy. Upper chest: Unremarkable Other: Served skip other IMPRESSION: 1. No acute intracranial findings or acute cervical spine findings. 2. Chronic right maxillary and ethmoid sinusitis. 3. Reduced sensitivity due to motion artifact. 4. Foraminal impingement at all cervical levels due to uncinate spurring and facet arthropathy. 5. Congenital fusion at C2-3. Electronically Signed   By: Van Clines M.D.   On: 07/01/2018 23:27     Scheduled Meds: .  bethanechol  25 mg Oral Daily  . finasteride  5 mg Oral Daily  . tamsulosin  0.8 mg Oral QHS   Continuous Infusions: . cefTRIAXone (ROCEPHIN)  IV 1 g (07/02/18 1730)     LOS: 0 days   Time Spent in minutes   30 minutes  Jabarie Pop D.O. on 07/03/2018 at 11:02 AM  Between 7am to 7pm - Please see pager noted on [email or web address]  After 7pm go to [email or web address]  And look for the night coverage person covering for me after hours  [facility] Office  [phone]

## 2018-07-04 DIAGNOSIS — Z87891 Personal history of nicotine dependence: Secondary | ICD-10-CM | POA: Diagnosis not present

## 2018-07-04 DIAGNOSIS — A419 Sepsis, unspecified organism: Secondary | ICD-10-CM | POA: Diagnosis not present

## 2018-07-04 DIAGNOSIS — Z9181 History of falling: Secondary | ICD-10-CM | POA: Diagnosis not present

## 2018-07-04 DIAGNOSIS — M1990 Unspecified osteoarthritis, unspecified site: Secondary | ICD-10-CM | POA: Diagnosis not present

## 2018-07-04 DIAGNOSIS — R279 Unspecified lack of coordination: Secondary | ICD-10-CM | POA: Diagnosis not present

## 2018-07-04 DIAGNOSIS — G9341 Metabolic encephalopathy: Secondary | ICD-10-CM | POA: Diagnosis present

## 2018-07-04 DIAGNOSIS — R339 Retention of urine, unspecified: Secondary | ICD-10-CM | POA: Diagnosis not present

## 2018-07-04 DIAGNOSIS — R41841 Cognitive communication deficit: Secondary | ICD-10-CM | POA: Diagnosis not present

## 2018-07-04 DIAGNOSIS — R4182 Altered mental status, unspecified: Secondary | ICD-10-CM | POA: Diagnosis not present

## 2018-07-04 DIAGNOSIS — M6281 Muscle weakness (generalized): Secondary | ICD-10-CM | POA: Diagnosis not present

## 2018-07-04 DIAGNOSIS — F0150 Vascular dementia without behavioral disturbance: Secondary | ICD-10-CM | POA: Diagnosis not present

## 2018-07-04 DIAGNOSIS — E46 Unspecified protein-calorie malnutrition: Secondary | ICD-10-CM | POA: Diagnosis present

## 2018-07-04 DIAGNOSIS — R296 Repeated falls: Secondary | ICD-10-CM | POA: Diagnosis not present

## 2018-07-04 DIAGNOSIS — Z96649 Presence of unspecified artificial hip joint: Secondary | ICD-10-CM | POA: Diagnosis present

## 2018-07-04 DIAGNOSIS — G629 Polyneuropathy, unspecified: Secondary | ICD-10-CM | POA: Diagnosis not present

## 2018-07-04 DIAGNOSIS — R740 Nonspecific elevation of levels of transaminase and lactic acid dehydrogenase [LDH]: Secondary | ICD-10-CM | POA: Diagnosis not present

## 2018-07-04 DIAGNOSIS — A4101 Sepsis due to Methicillin susceptible Staphylococcus aureus: Secondary | ICD-10-CM | POA: Diagnosis present

## 2018-07-04 DIAGNOSIS — K7689 Other specified diseases of liver: Secondary | ICD-10-CM | POA: Diagnosis present

## 2018-07-04 DIAGNOSIS — F05 Delirium due to known physiological condition: Secondary | ICD-10-CM | POA: Diagnosis not present

## 2018-07-04 DIAGNOSIS — F0391 Unspecified dementia with behavioral disturbance: Secondary | ICD-10-CM | POA: Diagnosis present

## 2018-07-04 DIAGNOSIS — I10 Essential (primary) hypertension: Secondary | ICD-10-CM | POA: Diagnosis present

## 2018-07-04 DIAGNOSIS — N3001 Acute cystitis with hematuria: Secondary | ICD-10-CM | POA: Diagnosis not present

## 2018-07-04 DIAGNOSIS — M8628 Subacute osteomyelitis, other site: Secondary | ICD-10-CM | POA: Diagnosis not present

## 2018-07-04 DIAGNOSIS — A4901 Methicillin susceptible Staphylococcus aureus infection, unspecified site: Secondary | ICD-10-CM | POA: Diagnosis not present

## 2018-07-04 DIAGNOSIS — L03113 Cellulitis of right upper limb: Secondary | ICD-10-CM | POA: Diagnosis not present

## 2018-07-04 DIAGNOSIS — R7881 Bacteremia: Secondary | ICD-10-CM | POA: Diagnosis not present

## 2018-07-04 DIAGNOSIS — R278 Other lack of coordination: Secondary | ICD-10-CM | POA: Diagnosis not present

## 2018-07-04 DIAGNOSIS — M542 Cervicalgia: Secondary | ICD-10-CM | POA: Diagnosis not present

## 2018-07-04 DIAGNOSIS — D696 Thrombocytopenia, unspecified: Secondary | ICD-10-CM | POA: Diagnosis not present

## 2018-07-04 DIAGNOSIS — G4700 Insomnia, unspecified: Secondary | ICD-10-CM | POA: Diagnosis not present

## 2018-07-04 DIAGNOSIS — R0689 Other abnormalities of breathing: Secondary | ICD-10-CM | POA: Diagnosis not present

## 2018-07-04 DIAGNOSIS — Z66 Do not resuscitate: Secondary | ICD-10-CM | POA: Diagnosis not present

## 2018-07-04 DIAGNOSIS — M7989 Other specified soft tissue disorders: Secondary | ICD-10-CM | POA: Diagnosis not present

## 2018-07-04 DIAGNOSIS — M4622 Osteomyelitis of vertebra, cervical region: Secondary | ICD-10-CM | POA: Diagnosis present

## 2018-07-04 DIAGNOSIS — Z743 Need for continuous supervision: Secondary | ICD-10-CM | POA: Diagnosis not present

## 2018-07-04 DIAGNOSIS — R4701 Aphasia: Secondary | ICD-10-CM | POA: Diagnosis not present

## 2018-07-04 DIAGNOSIS — M545 Low back pain: Secondary | ICD-10-CM | POA: Diagnosis not present

## 2018-07-04 DIAGNOSIS — G062 Extradural and subdural abscess, unspecified: Secondary | ICD-10-CM | POA: Diagnosis present

## 2018-07-04 DIAGNOSIS — Z7189 Other specified counseling: Secondary | ICD-10-CM | POA: Diagnosis not present

## 2018-07-04 DIAGNOSIS — F419 Anxiety disorder, unspecified: Secondary | ICD-10-CM | POA: Diagnosis not present

## 2018-07-04 DIAGNOSIS — R6520 Severe sepsis without septic shock: Secondary | ICD-10-CM | POA: Diagnosis present

## 2018-07-04 DIAGNOSIS — E870 Hyperosmolality and hypernatremia: Secondary | ICD-10-CM | POA: Diagnosis not present

## 2018-07-04 DIAGNOSIS — N400 Enlarged prostate without lower urinary tract symptoms: Secondary | ICD-10-CM | POA: Diagnosis present

## 2018-07-04 DIAGNOSIS — M549 Dorsalgia, unspecified: Secondary | ICD-10-CM | POA: Diagnosis not present

## 2018-07-04 DIAGNOSIS — Z515 Encounter for palliative care: Secondary | ICD-10-CM | POA: Diagnosis not present

## 2018-07-04 DIAGNOSIS — R531 Weakness: Secondary | ICD-10-CM | POA: Diagnosis not present

## 2018-07-04 DIAGNOSIS — G061 Intraspinal abscess and granuloma: Secondary | ICD-10-CM | POA: Diagnosis not present

## 2018-07-04 DIAGNOSIS — G8250 Quadriplegia, unspecified: Secondary | ICD-10-CM | POA: Diagnosis not present

## 2018-07-04 DIAGNOSIS — J9811 Atelectasis: Secondary | ICD-10-CM | POA: Diagnosis not present

## 2018-07-04 DIAGNOSIS — M4642 Discitis, unspecified, cervical region: Secondary | ICD-10-CM | POA: Diagnosis present

## 2018-07-04 DIAGNOSIS — R410 Disorientation, unspecified: Secondary | ICD-10-CM | POA: Diagnosis not present

## 2018-07-04 DIAGNOSIS — K760 Fatty (change of) liver, not elsewhere classified: Secondary | ICD-10-CM | POA: Diagnosis present

## 2018-07-04 DIAGNOSIS — F0280 Dementia in other diseases classified elsewhere without behavioral disturbance: Secondary | ICD-10-CM | POA: Diagnosis not present

## 2018-07-04 DIAGNOSIS — G9529 Other cord compression: Secondary | ICD-10-CM | POA: Diagnosis present

## 2018-07-04 DIAGNOSIS — G9340 Encephalopathy, unspecified: Secondary | ICD-10-CM | POA: Diagnosis not present

## 2018-07-04 DIAGNOSIS — Z960 Presence of urogenital implants: Secondary | ICD-10-CM | POA: Diagnosis not present

## 2018-07-04 DIAGNOSIS — R4020 Unspecified coma: Secondary | ICD-10-CM | POA: Diagnosis not present

## 2018-07-04 DIAGNOSIS — R0902 Hypoxemia: Secondary | ICD-10-CM | POA: Diagnosis not present

## 2018-07-04 DIAGNOSIS — B9561 Methicillin susceptible Staphylococcus aureus infection as the cause of diseases classified elsewhere: Secondary | ICD-10-CM | POA: Diagnosis present

## 2018-07-04 DIAGNOSIS — G0481 Other encephalitis and encephalomyelitis: Secondary | ICD-10-CM | POA: Diagnosis present

## 2018-07-04 DIAGNOSIS — N390 Urinary tract infection, site not specified: Secondary | ICD-10-CM | POA: Diagnosis present

## 2018-07-04 DIAGNOSIS — F0390 Unspecified dementia without behavioral disturbance: Secondary | ICD-10-CM | POA: Diagnosis not present

## 2018-07-04 DIAGNOSIS — G8929 Other chronic pain: Secondary | ICD-10-CM | POA: Diagnosis not present

## 2018-07-04 DIAGNOSIS — N281 Cyst of kidney, acquired: Secondary | ICD-10-CM | POA: Diagnosis not present

## 2018-07-04 DIAGNOSIS — Z7401 Bed confinement status: Secondary | ICD-10-CM | POA: Diagnosis not present

## 2018-07-04 DIAGNOSIS — M4696 Unspecified inflammatory spondylopathy, lumbar region: Secondary | ICD-10-CM | POA: Diagnosis not present

## 2018-07-04 DIAGNOSIS — M5489 Other dorsalgia: Secondary | ICD-10-CM | POA: Diagnosis not present

## 2018-07-04 DIAGNOSIS — R5383 Other fatigue: Secondary | ICD-10-CM | POA: Diagnosis not present

## 2018-07-04 DIAGNOSIS — M4643 Discitis, unspecified, cervicothoracic region: Secondary | ICD-10-CM | POA: Diagnosis not present

## 2018-07-04 DIAGNOSIS — N3281 Overactive bladder: Secondary | ICD-10-CM | POA: Diagnosis not present

## 2018-07-04 LAB — BASIC METABOLIC PANEL
ANION GAP: 9 (ref 5–15)
BUN: 12 mg/dL (ref 8–23)
CALCIUM: 8.5 mg/dL — AB (ref 8.9–10.3)
CO2: 27 mmol/L (ref 22–32)
Chloride: 98 mmol/L (ref 98–111)
Creatinine, Ser: 0.74 mg/dL (ref 0.61–1.24)
GFR calc Af Amer: >60 mL/min (ref >60)
GFR calc non Af Amer: >60 mL/min (ref >60)
Glucose, Bld: 142 mg/dL — ABNORMAL HIGH (ref 70–99)
Potassium: 3.1 mmol/L — ABNORMAL LOW (ref 3.5–5.1)
Sodium: 134 mmol/L — ABNORMAL LOW (ref 135–145)

## 2018-07-04 LAB — MAGNESIUM: MAGNESIUM: 2.4 mg/dL (ref 1.7–2.4)

## 2018-07-04 MED ORDER — POTASSIUM CHLORIDE 20 MEQ/15ML (10%) PO SOLN: 40.0000 meq | ORAL | Status: DC

## 2018-07-04 MED ORDER — NITROFURANTOIN MONOHYD MACRO 100 MG PO CAPS: 100.0000 mg | ORAL_CAPSULE | Freq: Two times a day (BID) | ORAL | 0 refills | Status: AC

## 2018-07-04 MED ORDER — POTASSIUM CHLORIDE ER 10 MEQ PO TBCR: 10.0000 meq | EXTENDED_RELEASE_TABLET | Freq: Every day | ORAL | 0 refills | Status: DC

## 2018-07-04 MED ORDER — POTASSIUM CHLORIDE 20 MEQ/15ML (10%) PO SOLN
60.0000 meq | Freq: Once | ORAL | Status: AC
  Administered 2018-07-04: 60 meq via ORAL
  Filled 2018-07-04: qty 45

## 2018-07-04 NOTE — Progress Notes (Signed)
Ricky Black to be D/C'd Skilled nursing facility per MD order.  Discussed prescriptions and follow up appointments with the patient. Prescriptions given to patient, medication list explained in detail. Pt verbalized understanding.  Allergies as of 07/04/2018   No Known Allergies     Medication List    STOP taking these medications   ciprofloxacin 500 MG tablet Commonly known as:  CIPRO     TAKE these medications   bethanechol 25 MG tablet Commonly known as:  URECHOLINE Take 25 mg by mouth 2 (two) times daily.   celecoxib 200 MG capsule Commonly known as:  CELEBREX Take 200 mg by mouth daily.   cyclobenzaprine 5 MG tablet Commonly known as:  FLEXERIL Take 1 tablet (5 mg total) by mouth 2 (two) times daily as needed for muscle spasms.   donepezil 10 MG tablet Commonly known as:  ARICEPT Take 10 mg by mouth at bedtime.   escitalopram 20 MG tablet Commonly known as:  LEXAPRO Take 20 mg by mouth daily.   finasteride 5 MG tablet Commonly known as:  PROSCAR Take 5 mg by mouth daily.   hydrochlorothiazide 25 MG tablet Commonly known as:  HYDRODIURIL Take 25 mg by mouth daily.   HYDROcodone-acetaminophen 7.5-325 MG tablet Commonly known as:  NORCO Take 1 tablet by mouth every 6 (six) hours as needed for up to 2 days. What changed:  reasons to take this   LYRICA 75 MG capsule Generic drug:  pregabalin Take 75 mg by mouth 3 (three) times daily.   NAMENDA 10 MG tablet Generic drug:  memantine Take 10 mg by mouth 2 (two) times daily.   nitrofurantoin (macrocrystal-monohydrate) 100 MG capsule Commonly known as:  MACROBID Take 1 capsule (100 mg total) by mouth 2 (two) times daily for 7 days.   potassium chloride 10 MEQ tablet Commonly known as:  K-DUR Take 1 tablet (10 mEq total) by mouth daily.   tamsulosin 0.4 MG Caps capsule Commonly known as:  FLOMAX Take 0.8 mg by mouth at bedtime.   valsartan 160 MG tablet Commonly known as:  DIOVAN Take 160 mg by mouth  daily.            Durable Medical Equipment  (From admission, onward)         Start     Ordered   07/03/18 1023  For home use only DME Other see comment  Once    Comments:  Harrel Lemon lift   07/03/18 1022   07/03/18 0707  For home use only DME Walker  Once    Comments:  ?less than 23in in width  Question:  Patient needs a walker to treat with the following condition  Answer:  Fall   07/03/18 0707   07/03/18 0706  For home use only DME Shower stool  Once     07/03/18 7564   07/03/18 0705  For home use only DME Hospital bed  Once    Question Answer Comment  The above medical condition requires: Patient requires the ability to reposition frequently   Bed type Semi-electric      07/03/18 0707   07/03/18 0705  For home use only DME Bedside commode  Once    Question:  Patient needs a bedside commode to treat with the following condition  Answer:  Fall   07/03/18 0707   07/03/18 0705  For home use only DME lightweight manual wheelchair with seat cushion  Once    Comments:  Patient suffers from frequent falls, which  impairs their ability to perform daily activities in the home.  A walker will not resolve issue with performing activities of daily living. A wheelchair will allow patient to safely perform daily activities. Patient is not able to propel themselves in the home using a standard weight wheelchair due to weakness. Patient can self propel in the lightweight wheelchair.  Accessories: elevating leg rests (ELRs), wheel locks, extensions and anti-tippers.   07/03/18 0707          Vitals:   07/04/18 1212 07/04/18 1300  BP: (!) 174/81 137/88  Pulse: 62 61  Resp:    Temp:    SpO2:      Skin clean, dry and intact without evidence of skin break down, no evidence of skin tears noted. IV catheter discontinued intact. Site without signs and symptoms of complications. Dressing and pressure applied. Pt denies pain at this time. No complaints noted.  An After Visit Summary was printed  and given to the patient. Patient picked up by transporert via stretcher and transported to skilled nursing facility via ambulance.

## 2018-07-04 NOTE — Clinical Social Work Placement (Signed)
   CLINICAL SOCIAL WORK PLACEMENT  NOTE 07/04/18 - DISCHARGED TO [place]  Date:  07/04/2018  Patient Details  Name: Ricky Black MRN: [id number] Date of Birth: 02-11-1941  Clinical Social Work is seeking post-discharge placement for this patient at the [place] level of care (*CSW will initial, date and re-position this form in  chart as items are completed):  No   Patient/family provided with [facility]'s list of facilities offering this level of care within the geographic area requested by the patient (or if unable, by the patient's family).  Yes   Patient/family informed of their freedom to choose among providers that offer the needed level of care, that participate in Medicare, Medicaid or managed care program needed by the patient, have an available bed and are willing to accept the patient.  No   Patient/family informed of [facility]'s ownership interest in [facility] and [facility], as well as of the fact that they are under no obligation to receive care at these facilities.  PASRR submitted to EDS on 07/03/18     PASRR number received on 07/03/18     Existing PASRR number confirmed on       FL2 transmitted to all facilities in geographic area requested by pt/family on 07/03/18     FL2 transmitted to all facilities within larger geographic area on       Patient informed that his/her managed care company has contracts with or will negotiate with certain facilities, including the following:        Yes   Patient/family informed of bed offers received.  Patient chooses bed at [facility]     Physician recommends and patient chooses bed at      Patient to be transferred to [facility], room 39 on 07/04/18.  Patient to be transferred to facility by Ambulance     Patient family notified on 07/04/18 of transfer.  Name of family member notified:   Ricky Black, at the bedside.      PHYSICIAN       Additional Comment:    _______________________________________________ Sable Feil, LCSW 07/04/2018, 2:03 PM

## 2018-07-04 NOTE — Discharge Summary (Addendum)
Physician Discharge Summary  Ricky Black RCV:[id number] DOB: 07-10-1941 DOA: 07/01/2018  PCP: Marton Redwood, MD  Admit date: 07/01/2018 Discharge date: 07/04/2018  Time spent: 45 minutes  Recommendations for Outpatient Follow-up:  Patient will be discharged to skilled nursing facility.  Patient will need to follow up with primary care provider within one week of discharge, repeat BMP and CBC.  Patient should continue medications as prescribed.  Patient should follow a heart healthy diet.   Discharge Diagnoses:  Acute metabolic encephalopathy Urinary tract infection Essential hypertension Back pain Dementia Thrombocytopenia Hypokalemia Frequent falls  Discharge Condition: Stable  Diet recommendation: heart healthy  Filed Weights   07/02/18 0414 07/02/18 1509 07/03/18 0445  Weight: 112 kg 112 kg 113.1 kg    History of present illness:  on 07/02/2018 by Dr. Hortencia Conradi a 77 y.o.malewithhistory of dementia hypertension who was admitted and discharged yesterday after being admitted for back pain confusion and UTI was brought to the ER after patient's wife called EMS since patient had a fall at home. Is not clear if patient had a fall or not since patient does not recall and has dementia. Patient's wife stated to the EMS that patient was about to fall and had to be eased onto the floor. Patient has been having some difficulty walking since. Also found to be confused.  Hospital Course:  Acute metabolic encephalopathy -Suspect combination of urinary tract infection with dementia and possibly pain medications -Continue to treat UTI and monitor mental status -Patient continues to remain confused- suspect this is his baseline -CT head no acute intracranial findings -Discussed with patient's daughter, she states patient appears to better than described by patient's wife.  Urinary tract infection -Patient recently discharged 07/01/2018.   -UA 12/13:  showed few bacteria, small leukocytes, positive nitrates,>WBC: he was given script for Cipro -urine culture 12/11/219 showed no growth -Urine culture 06/30/2018 showed greater than 100,000 staph aureus.  Resistant to Bactrim -Urine culture from 07/01/2018 60K Staph Aureus- sensitivities pending -Upon review of patient's chart, feel that staph aureus is worrisome.  I would have suspected that given antibiotics as well as being discharged with Cipro would have eradicated infection however patient continues to have persistent staph aureus and repeat urine culture on 1213.   -Obtained blood cultures, no growth to date  -was placed on ceftriaxone -Discussed with pharmacy, would place on nitrofurantoin upon discharge as this has better urine penetration.  Essential hypertension -Continue valsartan, HCTZ  Back pain -Patient has been getting pain medication which could be contributing to some of his confusion -Patient was recently admitted and discharged on 07/01/2018.  He had undergone MRI of the lumbar spine which showed lumbar spinal canal stenosis with degenerative changes.  MRI thoracic spine showed multilevel disc and facet degenerative changes.  He was seen by physical therapy and home health was recommended.  Dementia -Continue Namenda and Aricept  Thrombocytopenia -?  unknown etiology -Platelets improved -repeat CBC in one week  Hypokalemia -Replaced. Continue potassium supplementation for one week- repeat BMP -magnesium 2.4  Frequent falls -Patient was seen by physical therapy, recommended home health -Discussed with patient's daughter, she would like patient to have equipment at home which would help keep the patient from falling such as hospital bed, bedside commode, shower bench etc. -Orders have been placed, case manager has been consulted. -Daughter now wanting to see if patient would be a SNF candidate-social work consulted -Given that patient has been readmitted in the  last 48 hours, feel  that patient is high risk of going home either without equipment or would benefit from SNF placement.  He lives at home with his wife who is unable to care for him properly without certain equipment in place. -patient will be discharged to SNF   Procedures: None  Consultations: None  Discharge Exam: Vitals:   07/04/18 1212 07/04/18 1300  BP: (!) 174/81 137/88  Pulse: 62 61  Resp:    Temp:    SpO2:     Patient with dementia. Complains of not being able to get up and walk around. Denies current chest pain, shortness of breath, abdominal pain, N/V/D/C.    General: Well developed, well nourished, NAD, appears stated age  61: NCAT, mucous membranes moist.  Neck: Supple  Cardiovascular: S1 S2 auscultated, RRR  Respiratory: Clear to auscultation bilaterally with equal chest rise  Abdomen: Soft, nontender, nondistended, + bowel sounds  Extremities: warm dry without cyanosis clubbing or edema  Neuro: AAOx1 (self only), nonfocal, dementia  Psych: Normal affect and demeanor, pleasantly confused  Discharge Instructions Discharge Instructions    Discharge instructions   Complete by:  As directed    Patient will be discharged to skilled nursing facility.  Patient will need to follow up with primary care provider within one week of discharge, repeat BMP and CBC.  Patient should continue medications as prescribed.  Patient should follow a heart healthy diet.     Allergies as of 07/04/2018   No Known Allergies     Medication List    STOP taking these medications   ciprofloxacin 500 MG tablet Commonly known as:  CIPRO     TAKE these medications   bethanechol 25 MG tablet Commonly known as:  URECHOLINE Take 25 mg by mouth 2 (two) times daily.   celecoxib 200 MG capsule Commonly known as:  CELEBREX Take 200 mg by mouth daily.   cyclobenzaprine 5 MG tablet Commonly known as:  FLEXERIL Take 1 tablet (5 mg total) by mouth 2 (two) times daily as  needed for muscle spasms.   donepezil 10 MG tablet Commonly known as:  ARICEPT Take 10 mg by mouth at bedtime.   escitalopram 20 MG tablet Commonly known as:  LEXAPRO Take 20 mg by mouth daily.   finasteride 5 MG tablet Commonly known as:  PROSCAR Take 5 mg by mouth daily.   hydrochlorothiazide 25 MG tablet Commonly known as:  HYDRODIURIL Take 25 mg by mouth daily.   HYDROcodone-acetaminophen 7.5-325 MG tablet Commonly known as:  NORCO Take 1 tablet by mouth every 6 (six) hours as needed for up to 2 days. What changed:  reasons to take this   LYRICA 75 MG capsule Generic drug:  pregabalin Take 75 mg by mouth 3 (three) times daily.   NAMENDA 10 MG tablet Generic drug:  memantine Take 10 mg by mouth 2 (two) times daily.   nitrofurantoin (macrocrystal-monohydrate) 100 MG capsule Commonly known as:  MACROBID Take 1 capsule (100 mg total) by mouth 2 (two) times daily for 7 days.   potassium chloride 10 MEQ tablet Commonly known as:  K-DUR Take 1 tablet (10 mEq total) by mouth daily.   tamsulosin 0.4 MG Caps capsule Commonly known as:  FLOMAX Take 0.8 mg by mouth at bedtime.   valsartan 160 MG tablet Commonly known as:  DIOVAN Take 160 mg by mouth daily.            Durable Medical Equipment  (From admission, onward)  Start     Ordered   07/03/18 1023  For home use only DME Other see comment  Once    Comments:  Hoyer lift   07/03/18 1022   07/03/18 0707  For home use only DME Walker  Once    Comments:  ?less than 23in in width  Question:  Patient needs a walker to treat with the following condition  Answer:  Fall   07/03/18 0707   07/03/18 0706  For home use only DME Shower stool  Once     07/03/18 0263   07/03/18 0705  For home use only DME Hospital bed  Once    Question Answer Comment  The above medical condition requires: Patient requires the ability to reposition frequently   Bed type Semi-electric      07/03/18 0707   07/03/18 0705  For  home use only DME Bedside commode  Once    Question:  Patient needs a bedside commode to treat with the following condition  Answer:  Fall   07/03/18 0707   07/03/18 0705  For home use only DME lightweight manual wheelchair with seat cushion  Once    Comments:  Patient suffers from frequent falls, which impairs their ability to perform daily activities in the home.  A walker will not resolve issue with performing activities of daily living. A wheelchair will allow patient to safely perform daily activities. Patient is not able to propel themselves in the home using a standard weight wheelchair due to weakness. Patient can self propel in the lightweight wheelchair.  Accessories: elevating leg rests (ELRs), wheel locks, extensions and anti-tippers.   07/03/18 0707         No Known Allergies Follow-up Information    Marton Redwood, MD. Schedule an appointment as soon as possible for a visit in 1 week(s).   Specialty:  Internal Medicine Why:  Hospital follow up Contact information: [address] [place] [address] [phone]            The results of significant diagnostics from this hospitalization (including imaging, microbiology, ancillary and laboratory) are listed below for reference.    Significant Diagnostic Studies: Dg Chest 1 View  Result Date: 07/01/2018 CLINICAL DATA:  Fall. EXAM: CHEST  1 VIEW COMPARISON:  Chest radiograph August 07, 2005 FINDINGS: The cardiac silhouette is upper limits of normal in size. Mediastinal silhouette is not suspicious. No pleural effusion or focal consolidation. No pneumothorax. Soft tissue planes and included osseous structures are nonacute. IMPRESSION: Borderline cardiomegaly, no acute pulmonary process. Electronically Signed   By: Elon Alas M.D.   On: 07/01/2018 23:05   Ct Head Wo Contrast  Result Date: 07/01/2018 CLINICAL DATA:  Altered mental status S. Fall. Urinary tract infection. Confusion. EXAM: CT HEAD WITHOUT  CONTRAST CT CERVICAL SPINE WITHOUT CONTRAST TECHNIQUE: Multidetector CT imaging of the head and cervical spine was performed following the standard protocol without intravenous contrast. Multiplanar CT image reconstructions of the cervical spine were also generated. COMPARISON:  06/30/2018 FINDINGS: Despite efforts by the technologist and patient, motion artifact is present on today's exam and could not be eliminated. This reduces exam sensitivity and specificity. CT HEAD FINDINGS Brain: The brainstem, cerebellum, cerebral peduncles, thalami, basal ganglia, basilar cisterns, and ventricular system appear within normal limits. No intracranial hemorrhage, mass lesion, or acute CVA. Overall no significant intracranial abnormality. Vascular: Unremarkable Skull: Unremarkable Sinuses/Orbits: Chronic right maxillary and ethmoid sinusitis. Other: No supplemental non-categorized findings. CT CERVICAL SPINE FINDINGS Alignment: No vertebral subluxation is  observed. Skull base and vertebrae: Reduced sensitivity due to motion, no discrete fracture is identified. Fused C2 and C3 vertebra. Soft tissues and spinal canal: Bilateral common carotid atherosclerotic calcification. Disc levels: Foraminal impingement at all cervical levels due to uncinate spurring and facet arthropathy. Upper chest: Unremarkable Other: Served skip other IMPRESSION: 1. No acute intracranial findings or acute cervical spine findings. 2. Chronic right maxillary and ethmoid sinusitis. 3. Reduced sensitivity due to motion artifact. 4. Foraminal impingement at all cervical levels due to uncinate spurring and facet arthropathy. 5. Congenital fusion at C2-3. Electronically Signed   By: Van Clines M.D.   On: 07/01/2018 23:27   Ct Head Wo Contrast  Result Date: 06/30/2018 CLINICAL DATA:  Altered mental status.  Head trauma. EXAM: CT HEAD WITHOUT CONTRAST CT CERVICAL SPINE WITHOUT CONTRAST TECHNIQUE: Multidetector CT imaging of the head and cervical  spine was performed following the standard protocol without intravenous contrast. Multiplanar CT image reconstructions of the cervical spine were also generated. COMPARISON:  Report from 09/07/2002 FINDINGS: Despite efforts by the technologist and patient, motion artifact is present on today's exam and could not be eliminated. This reduces exam sensitivity and specificity. CT HEAD FINDINGS Brain: The brainstem, cerebellum, cerebral peduncles, thalami, basal ganglia, basilar cisterns, and ventricular system appear within normal limits. No intracranial hemorrhage, mass lesion, or acute CVA. Vascular: Unremarkable Skull: Unremarkable Sinuses/Orbits: Mild chronic right maxillary sinusitis. Other: No supplemental non-categorized findings. CT CERVICAL SPINE FINDINGS Alignment: No vertebral subluxation is observed. Skull base and vertebrae: There is congenital fusion at C2-3 between the vertebral bodies and posterior elements. Prominent loss of articular space anteriorly at C1-2. Schmorl's nodes at C4-5. No appreciable fracture. Soft tissues and spinal canal: No prevertebral soft tissue swelling. Bilateral common carotid atherosclerotic calcification. Disc levels: Facet and uncinate spurring cause foraminal impingement on the right at C3-4, C4-5, C5-6, and C6-7; and on the left at C4-5, C5-6, C6-7, and C7-T1. There is central narrowing of the thecal sac due to spurring at C3-4 and C4-5. Upper chest: Unremarkable Other: No supplemental non-categorized findings. IMPRESSION: 1. No acute intracranial findings or acute cervical spine findings. 2. Impingement at all levels between C3 and T1 due to facet and uncinate spurring. 3. Congenital fusion at C2-3. 4. Atherosclerosis. 5. Mild chronic right maxillary sinusitis. Electronically Signed   By: Van Clines M.D.   On: 06/30/2018 20:50   Ct Cervical Spine Wo Contrast  Result Date: 07/01/2018 CLINICAL DATA:  Altered mental status S. Fall. Urinary tract infection.  Confusion. EXAM: CT HEAD WITHOUT CONTRAST CT CERVICAL SPINE WITHOUT CONTRAST TECHNIQUE: Multidetector CT imaging of the head and cervical spine was performed following the standard protocol without intravenous contrast. Multiplanar CT image reconstructions of the cervical spine were also generated. COMPARISON:  06/30/2018 FINDINGS: Despite efforts by the technologist and patient, motion artifact is present on today's exam and could not be eliminated. This reduces exam sensitivity and specificity. CT HEAD FINDINGS Brain: The brainstem, cerebellum, cerebral peduncles, thalami, basal ganglia, basilar cisterns, and ventricular system appear within normal limits. No intracranial hemorrhage, mass lesion, or acute CVA. Overall no significant intracranial abnormality. Vascular: Unremarkable Skull: Unremarkable Sinuses/Orbits: Chronic right maxillary and ethmoid sinusitis. Other: No supplemental non-categorized findings. CT CERVICAL SPINE FINDINGS Alignment: No vertebral subluxation is observed. Skull base and vertebrae: Reduced sensitivity due to motion, no discrete fracture is identified. Fused C2 and C3 vertebra. Soft tissues and spinal canal: Bilateral common carotid atherosclerotic calcification. Disc levels: Foraminal impingement at all cervical levels due to uncinate spurring  and facet arthropathy. Upper chest: Unremarkable Other: Served skip other IMPRESSION: 1. No acute intracranial findings or acute cervical spine findings. 2. Chronic right maxillary and ethmoid sinusitis. 3. Reduced sensitivity due to motion artifact. 4. Foraminal impingement at all cervical levels due to uncinate spurring and facet arthropathy. 5. Congenital fusion at C2-3. Electronically Signed   By: Van Clines M.D.   On: 07/01/2018 23:27   Ct Cervical Spine Wo Contrast  Result Date: 06/30/2018 CLINICAL DATA:  Altered mental status.  Head trauma. EXAM: CT HEAD WITHOUT CONTRAST CT CERVICAL SPINE WITHOUT CONTRAST TECHNIQUE:  Multidetector CT imaging of the head and cervical spine was performed following the standard protocol without intravenous contrast. Multiplanar CT image reconstructions of the cervical spine were also generated. COMPARISON:  Report from 09/07/2002 FINDINGS: Despite efforts by the technologist and patient, motion artifact is present on today's exam and could not be eliminated. This reduces exam sensitivity and specificity. CT HEAD FINDINGS Brain: The brainstem, cerebellum, cerebral peduncles, thalami, basal ganglia, basilar cisterns, and ventricular system appear within normal limits. No intracranial hemorrhage, mass lesion, or acute CVA. Vascular: Unremarkable Skull: Unremarkable Sinuses/Orbits: Mild chronic right maxillary sinusitis. Other: No supplemental non-categorized findings. CT CERVICAL SPINE FINDINGS Alignment: No vertebral subluxation is observed. Skull base and vertebrae: There is congenital fusion at C2-3 between the vertebral bodies and posterior elements. Prominent loss of articular space anteriorly at C1-2. Schmorl's nodes at C4-5. No appreciable fracture. Soft tissues and spinal canal: No prevertebral soft tissue swelling. Bilateral common carotid atherosclerotic calcification. Disc levels: Facet and uncinate spurring cause foraminal impingement on the right at C3-4, C4-5, C5-6, and C6-7; and on the left at C4-5, C5-6, C6-7, and C7-T1. There is central narrowing of the thecal sac due to spurring at C3-4 and C4-5. Upper chest: Unremarkable Other: No supplemental non-categorized findings. IMPRESSION: 1. No acute intracranial findings or acute cervical spine findings. 2. Impingement at all levels between C3 and T1 due to facet and uncinate spurring. 3. Congenital fusion at C2-3. 4. Atherosclerosis. 5. Mild chronic right maxillary sinusitis. Electronically Signed   By: Van Clines M.D.   On: 06/30/2018 20:50   Mr Thoracic Spine Wo Contrast  Result Date: 07/01/2018 CLINICAL DATA:  77 y/o M;  worsening back pain, confusion, generalized weakness over the past couple of days. EXAM: MRI THORACIC AND LUMBAR SPINE WITHOUT CONTRAST TECHNIQUE: Multiplanar and multiecho pulse sequences of the thoracic and lumbar spine were obtained without intravenous contrast. The patient was confused and moving, unable to continue the exam, lumbar axial sequences were not acquired. COMPARISON:  06/29/2018 CT abdomen pelvis. FINDINGS: MRI THORACIC SPINE FINDINGS Alignment:  Physiologic. Vertebrae: No fracture, evidence of discitis, or bone lesion. Cord:  Normal signal and morphology. Paraspinal and other soft tissues: Negative. Disc levels: Mild multilevel disc and facet degenerative changes. No significant foraminal or canal stenosis. MRI LUMBAR SPINE FINDINGS Segmentation:  Standard. Alignment:  Straightening of lumbar lordosis.  No listhesis. Vertebrae: No fracture, evidence of discitis, or bone lesion. Bilateral L3-4 facet edema, greater on the right, with edema in surrounding soft tissues, likely related to degenerative facet arthritis. Conus medullaris and cauda equina: Conus extends to the L1 level. Conus and cauda equina appear normal. Paraspinal and other soft tissues: Negative. Disc levels: Sagittal sequences only. 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 L1-2: No significant disc displacement, foraminal stenosis, or canal stenosis. L2-3: Disc bulge with left-greater-than-right facet hypertrophy. Mild left foraminal stenosis. Moderate to severe spinal canal stenosis. L3-4: Disc bulge with advanced  bilateral facet hypertrophy, greater on the right. Moderate bilateral foraminal stenosis. Severe spinal canal stenosis. L4-5: Disc bulge with endplate marginal osteophytes and advanced facet hypertrophy. Moderate right and severe left foraminal stenosis. Mild spinal canal stenosis. L5-S1: Disc bulge with endplate marginal osteophytes and advanced bilateral facet hypertrophy. Moderate to severe bilateral  foraminal stenosis. Mild spinal canal stenosis. The disc bulge narrows the bilateral recesses with contact on the descending S1 nerve roots. IMPRESSION: MR THORACIC SPINE IMPRESSION 1. No acute osseous or cord signal abnormality. 2. Mild multilevel discogenic degenerative changes of the thoracic spine. No foraminal or canal stenosis. MR LUMBAR SPINE IMPRESSION 1. Sagittal sequences only. 2. No acute osseous abnormality or malalignment. 3. 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 4. Bilateral L3-4 facet edema, likely degenerative. 5. Moderate to severe L2-3 and severe L3-4 spinal canal stenosis. Multilevel high-grade foraminal stenosis. Electronically Signed   By: Kristine Garbe M.D.   On: 07/01/2018 03:47   Mr Lumbar Spine Wo Contrast  Result Date: 07/01/2018 CLINICAL DATA:  77 y/o M; worsening back pain, confusion, generalized weakness over the past couple of days. EXAM: MRI THORACIC AND LUMBAR SPINE WITHOUT CONTRAST TECHNIQUE: Multiplanar and multiecho pulse sequences of the thoracic and lumbar spine were obtained without intravenous contrast. The patient was confused and moving, unable to continue the exam, lumbar axial sequences were not acquired. COMPARISON:  06/29/2018 CT abdomen pelvis. FINDINGS: MRI THORACIC SPINE FINDINGS Alignment:  Physiologic. Vertebrae: No fracture, evidence of discitis, or bone lesion. Cord:  Normal signal and morphology. Paraspinal and other soft tissues: Negative. Disc levels: Mild multilevel disc and facet degenerative changes. No significant foraminal or canal stenosis. MRI LUMBAR SPINE FINDINGS Segmentation:  Standard. Alignment:  Straightening of lumbar lordosis.  No listhesis. Vertebrae: No fracture, evidence of discitis, or bone lesion. Bilateral L3-4 facet edema, greater on the right, with edema in surrounding soft tissues, likely related to degenerative facet  arthritis. Conus medullaris and cauda equina: Conus extends to the L1 level. Conus and cauda equina appear normal. Paraspinal and other soft tissues: Negative. Disc levels: Sagittal sequences only. 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 L1-2: No significant disc displacement, foraminal stenosis, or canal stenosis. L2-3: Disc bulge with left-greater-than-right facet hypertrophy. Mild left foraminal stenosis. Moderate to severe spinal canal stenosis. L3-4: Disc bulge with advanced bilateral facet hypertrophy, greater on the right. Moderate bilateral foraminal stenosis. Severe spinal canal stenosis. L4-5: Disc bulge with endplate marginal osteophytes and advanced facet hypertrophy. Moderate right and severe left foraminal stenosis. Mild spinal canal stenosis. L5-S1: Disc bulge with endplate marginal osteophytes and advanced bilateral facet hypertrophy. Moderate to severe bilateral foraminal stenosis. Mild spinal canal stenosis. The disc bulge narrows the bilateral recesses with contact on the descending S1 nerve roots. IMPRESSION: MR THORACIC SPINE IMPRESSION 1. No acute osseous or cord signal abnormality. 2. Mild multilevel discogenic degenerative changes of the thoracic spine. No foraminal or canal stenosis. MR LUMBAR SPINE IMPRESSION 1. Sagittal sequences only. 2. No acute osseous abnormality or malalignment. 3. 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 4. Bilateral L3-4 facet edema, likely degenerative. 5. Moderate to severe L2-3 and severe L3-4 spinal canal stenosis. Multilevel high-grade foraminal stenosis. Electronically Signed   By: Kristine Garbe M.D.   On: 07/01/2018 03:47   Ct Angio Abd/pel W And/or Wo Contrast  Result Date: 06/29/2018 CLINICAL DATA:  Low back pain beginning this morning. Evaluate for abdominal aortic aneurysm. EXAM: CT ANGIOGRAPHY ABDOMEN AND PELVIS WITH  CONTRAST AND WITHOUT CONTRAST TECHNIQUE: Multidetector CT imaging of the abdomen and pelvis was performed using the standard protocol during bolus administration of intravenous contrast. Multiplanar reconstructed images and MIPs were obtained and reviewed to evaluate the vascular anatomy. CONTRAST:  170mL ISOVUE-370 IOPAMIDOL (ISOVUE-370) INJECTION 76% COMPARISON:  CT abdomen and pelvis - 04/27/2016; 03/01/2013; 07/12/2003 FINDINGS: VASCULAR Aorta: Moderate amount of predominantly calcified atherosclerotic plaque within a normal caliber abdominal aorta, not resulting in hemodynamically significant stenosis. No abdominal aortic dissection or periaortic stranding. Celiac: There is a minimal amount eccentric calcified plaque about the origin of the celiac artery, not resulting in hemodynamically significant stenosis. Conventional branching pattern. SMA: There is a minimal amount of mixed calcified and noncalcified atherosclerotic plaque involving the origin and proximal aspect of the SMA, not resulting in hemodynamically significant stenosis. Conventional branching pattern. The distal tributaries of the SMA appear widely patent without discrete intraluminal filling defect suggest distal embolism. Renals: Solitary bilaterally; there is a moderate amount of eccentric calcified atherosclerotic plaque involving the origin of the bilateral renal arteries which approaches 50% luminal narrowing bilaterally though is without associated asymmetric renal atrophy or perinephric stranding. IMA: Widely patent. Inflow: There is a minimal amount of crescentic calcified atherosclerotic plaque within the bilateral normal caliber common iliac arteries, not resulting in hemodynamically significant stenosis. The bilateral external iliac arteries are tortuous though widely patent of normal caliber. The bilateral internal iliac arteries are mildly disease though patent and of normal caliber. Proximal Outflow: There is a minimal amount of  eccentric probably calcified atherosclerotic plaque involving the bilateral common femoral arteries, not resulting in hemodynamically significant stenosis. The bilateral superficial and deep femoral arteries appear widely patent throughout their imaged courses. Veins: The IVC and pelvic venous system appear widely patent on this arterial phase examination. Review of the MIP images confirms the above findings. _________________________________________________________ NON-VASCULAR Lower chest: Limited visualization of the lower thorax demonstrates minimal subsegmental atelectasis within the image right lower lobe. No focal airspace opacities. No pleural effusion. Hepatobiliary: There is diffuse decreased attenuation of the hepatic parenchyma on this postcontrast examination suggestive of hepatic steatosis. No discrete hyperenhancing hepatic lesions. Normal appearance of the gallbladder given degree distention. No radiopaque gallstones. No ascites. Pancreas: Normal appearance of the pancreas Spleen: Normal appearance of the spleen Adrenals/Urinary Tract: There is symmetric enhancement of the bilateral kidneys. Note is made of an approximately 2.4 cm hypoattenuating (11 Hounsfield unit) left-sided renal cyst (image 37, series 5 as well as an approximately 3.3 cm exophytic hypoattenuating right-sided renal cyst (image 24). There is geographic cortical atrophy involving the superior pole of the right kidney (image coronal image 134, series 8). No discrete worrisome renal lesions. No definite evidence of nephrolithiasis on this postcontrast examination. There is a very minimal amount of likely age and body habitus related perinephric stranding. No urine obstruction. There is mild thickening of the bilateral adrenal glands without discrete nodule. Normal appearance of the urinary bladder given degree of distention. Stomach/Bowel: Scattered colonic diverticulosis primarily involving the distal aspect of the descending and  sigmoid colon, without evidence of superimposed acute diverticulitis. Normal appearance of the terminal ileum and appendix. Moderate size hiatal hernia. No pneumoperitoneum, pneumatosis or portal venous gas. Lymphatic: No bulky retroperitoneal, mesenteric, pelvic or inguinal lymphadenopathy. Reproductive: Dystrophic calcifications within normal sized prostate gland, though note, evaluation degraded secondary streak artifact from the patient's right total hip prosthesis. Other: Minimal amount  of subcutaneous edema about the midline of the low back. Musculoskeletal: No definite acute or aggressive osseous abnormalities. Post right total hip replacement without definitive evidence of hardware failure loosening though note the caudal aspect of the right femoral stem component was not imaged. There are multiple peripherally calcified ossicles superior to the right greater trochanter likely the sequela of prior trauma and/or iatrogenic. Severe degenerative change of the contralateral left hip with joint space loss, subchondral sclerosis and osteophytosis. No evidence avascular necrosis. Moderate severe degenerative change throughout the lumbar spine. Stigmata of DISH throughout the thoracic and lumbar spine. Post L5 laminectomy with linear calcifications posterior to the sacrum with associated linear subcutaneous scarring. IMPRESSION: VASCULAR 1. No definite explanation for patient's acute back pain. Specifically, no evidence of abdominal aortic aneurysm or dissection. 2.  Aortic Atherosclerosis (ICD10-I70.0). 3. Suspected hemodynamically significant stenosis ease involving the bilateral renal arteries without associated asymmetric renal atrophy or delayed renal enhancement. NON-VASCULAR 1. Colonic diverticulosis without evidence of superimposed acute diverticulitis. 2. Suspected hepatic steatosis. Correlation with LFTs could be performed as clinically indicated. 3. Moderate size hiatal hernia. Electronically Signed   By:  Sandi Mariscal M.D.   On: 06/29/2018 18:53    Microbiology: Recent Results (from the past 240 hour(s))  Urine culture     Status: None   Collection Time: 06/29/18  7:06 PM  Result Value Ref Range Status   Specimen Description URINE, CLEAN CATCH  Final   Special Requests NONE  Final   Culture   Final    NO GROWTH Performed at [facility], [address] [place], [place] [address]    Report Status 07/01/2018 FINAL  Final  Urine culture     Status: Abnormal   Collection Time: 06/30/18  9:39 PM  Result Value Ref Range Status   Specimen Description URINE, CLEAN CATCH  Final   Special Requests   Final    NONE Performed at [facility], [place] [address], [place], [place] [address]    Culture >=100,000 COLONIES/mL STAPHYLOCOCCUS AUREUS (A)  Final   Report Status 07/03/2018 FINAL  Final   Organism ID, Bacteria STAPHYLOCOCCUS AUREUS (A)  Final      Susceptibility   Staphylococcus aureus - MIC*    CIPROFLOXACIN <=0.5 SENSITIVE Sensitive     GENTAMICIN <=0.5 SENSITIVE Sensitive     NITROFURANTOIN <=16 SENSITIVE Sensitive     OXACILLIN 0.5 SENSITIVE Sensitive     TETRACYCLINE <=1 SENSITIVE Sensitive     VANCOMYCIN <=0.5 SENSITIVE Sensitive     TRIMETH/SULFA 160 RESISTANT Resistant     CLINDAMYCIN <=0.25 SENSITIVE Sensitive     RIFAMPIN <=0.5 SENSITIVE Sensitive     Inducible Clindamycin NEGATIVE Sensitive     * >=100,000 COLONIES/mL STAPHYLOCOCCUS AUREUS  Urine culture     Status: Abnormal (Preliminary result)   Collection Time: 07/01/18 11:31 PM  Result Value Ref Range Status   Specimen Description URINE, CLEAN CATCH  Final   Special Requests NONE  Final   Culture (A)  Final    60,000 COLONIES/mL STAPHYLOCOCCUS AUREUS SUSCEPTIBILITIES TO FOLLOW Performed at [facility], [place] [address], [place], [place] [address]    Report Status PENDING  Incomplete  Culture, blood (routine x 2)     Status: None (Preliminary result)   Collection Time: 07/03/18 12:04 PM  Result  Value Ref Range Status   Specimen Description BLOOD RIGHT ANTECUBITAL  Final   Special Requests   Final    BOTTLES DRAWN AEROBIC AND  ANAEROBIC Blood Culture results may not be optimal due to an excessive volume of blood received in culture bottles   Culture   Final    NO GROWTH 1 DAY Performed at [facility] [address], [place], [place] [address]    Report Status PENDING  Incomplete  Culture, blood (routine x 2)     Status: None (Preliminary result)   Collection Time: 07/03/18 12:10 PM  Result Value Ref Range Status   Specimen Description BLOOD RIGHT HAND  Final   Special Requests   Final    BOTTLES DRAWN AEROBIC AND ANAEROBIC Blood Culture results may not be optimal due to an excessive volume of blood received in culture bottles   Culture   Final    NO GROWTH 1 DAY Performed at [facility], [place] [address] [place], [place] [address]    Report Status PENDING  Incomplete     Labs: Basic Metabolic Panel: Recent Labs  Lab 06/30/18 1926 07/01/18 2324 07/02/18 0516 07/03/18 1204 07/04/18 1251  NA 139 138 138 134* 134*  K 3.4* 3.3* 3.4* 3.3* 3.1*  CL 100 100 99 98 98  CO2 27 25 27 23 27   GLUCOSE 144* 151* 136* 141* 142*  BUN 18 19 17 17 12   CREATININE 0.96 0.87 0.77 0.67 0.74  CALCIUM 9.3 8.6* 8.5* 8.4* 8.5*   Liver Function Tests: Recent Labs  Lab 06/29/18 1511 06/30/18 1926  AST 30 33  ALT 37 31  ALKPHOS 43 35*  BILITOT 0.9 1.2  PROT 6.8 6.5  ALBUMIN 4.3 3.9   No results for input(s): LIPASE, AMYLASE in the last 168 hours. Recent Labs  Lab 07/01/18 0153  AMMONIA 25   CBC: Recent Labs  Lab 06/29/18 1511 06/30/18 1926 07/01/18 2324 07/02/18 0516 07/03/18 1204  WBC 10.4 8.2 6.4 6.2 6.3  NEUTROABS  --  7.3 5.5  --   --   HGB 14.6 14.3 13.8 13.6 13.1  HCT 45.2 43.0 40.6 42.1 39.0  MCV 102.5* 101.4* 100.7* 101.0* 98.0  PLT 132* 99* PLATELET CLUMPS NOTED ON SMEAR, COUNT APPEARS DECREASED 94* 115*   Cardiac Enzymes: Recent Labs  Lab  07/01/18 0153  CKTOTAL 665*   BNP: BNP (last 3 results) No results for input(s): BNP in the last 8760 hours.  ProBNP (last 3 results) No results for input(s): PROBNP in the last 8760 hours.  CBG: No results for input(s): GLUCAP in the last 168 hours.     Signed:  Cristal Ford  [facility] 07/04/2018, 3:09 PM

## 2018-07-04 NOTE — Progress Notes (Signed)
Physical Therapy Plan of Care Update  Noted plan for pt to d/c to SNF, and PT frequency updated to reflect this updated discharge disposition.    07/04/18 0823  PT Visit Information  Last PT Received On 07/04/18   PT - Assessment/Plan  PT Plan Frequency needs to be updated  PT Frequency (ACUTE ONLY) Min 2X/week  Follow Up Recommendations SNF   Rolinda Roan, PT, DPT Acute Rehabilitation Services Pager: [phone] Office: [phone]

## 2018-07-04 NOTE — Care Management Note (Addendum)
Case Management Note Manya Silvas, RN [place] 56M [phone]  Patient Details  Name: Ricky Black MRN: [id number] Date of Birth: 12-29-1940  Subjective/Objective:             Acute encephalopathy; UTI remarkable for staph in urine   Action/Plan: PTA home with wife. Patient unable to return home with wife. CSW working on snf placement for transition of care. [facility] following with assisting with 3 day waiver assistance. Will continue to monitor for additional transition of care needs.   Expected Discharge Date:  07/04/2018               Expected Discharge Plan:  Skilled Nursing Facility  In-House Referral:  Clinical Social Work, [facility]  Discharge planning Services  CM Consult  Post Acute Care Choice:  NA Choice offered to:  NA  DME Arranged:  N/A DME Agency:  NA  HH Arranged:  NA HH Agency:  NA  Status of Service:  Completed, signed off  If discussed at [place] of Stay Meetings, dates discussed:    Additional Comments: Patient 3 day waiver approved. Will discharge today to [facility]. Notified [facility] of patient disposition.   Bartholomew Crews, RN 07/04/2018, 11:27 AM

## 2018-07-05 DIAGNOSIS — F0390 Unspecified dementia without behavioral disturbance: Secondary | ICD-10-CM | POA: Diagnosis not present

## 2018-07-05 DIAGNOSIS — N390 Urinary tract infection, site not specified: Secondary | ICD-10-CM | POA: Diagnosis not present

## 2018-07-05 DIAGNOSIS — I10 Essential (primary) hypertension: Secondary | ICD-10-CM | POA: Diagnosis not present

## 2018-07-05 DIAGNOSIS — M549 Dorsalgia, unspecified: Secondary | ICD-10-CM | POA: Diagnosis not present

## 2018-07-05 LAB — URINE CULTURE: Culture: 60000 — AB

## 2018-07-07 DIAGNOSIS — G9341 Metabolic encephalopathy: Secondary | ICD-10-CM | POA: Diagnosis not present

## 2018-07-07 DIAGNOSIS — N390 Urinary tract infection, site not specified: Secondary | ICD-10-CM | POA: Diagnosis not present

## 2018-07-07 DIAGNOSIS — F0390 Unspecified dementia without behavioral disturbance: Secondary | ICD-10-CM | POA: Diagnosis not present

## 2018-07-07 DIAGNOSIS — I10 Essential (primary) hypertension: Secondary | ICD-10-CM | POA: Diagnosis not present

## 2018-07-08 LAB — CULTURE, BLOOD (ROUTINE X 2)
Culture: NO GROWTH
Culture: NO GROWTH

## 2018-07-15 DIAGNOSIS — N390 Urinary tract infection, site not specified: Secondary | ICD-10-CM | POA: Diagnosis not present

## 2018-07-15 DIAGNOSIS — M549 Dorsalgia, unspecified: Secondary | ICD-10-CM | POA: Diagnosis not present

## 2018-07-15 DIAGNOSIS — G8929 Other chronic pain: Secondary | ICD-10-CM | POA: Diagnosis not present

## 2018-07-15 DIAGNOSIS — R296 Repeated falls: Secondary | ICD-10-CM | POA: Diagnosis not present

## 2018-07-19 DIAGNOSIS — R5383 Other fatigue: Secondary | ICD-10-CM | POA: Diagnosis not present

## 2018-07-19 DIAGNOSIS — R296 Repeated falls: Secondary | ICD-10-CM | POA: Diagnosis not present

## 2018-07-19 DIAGNOSIS — F0390 Unspecified dementia without behavioral disturbance: Secondary | ICD-10-CM | POA: Diagnosis not present

## 2018-07-19 DIAGNOSIS — R531 Weakness: Secondary | ICD-10-CM | POA: Diagnosis not present

## 2018-07-21 ENCOUNTER — Other Ambulatory Visit: Payer: Self-pay | Admitting: *Deleted

## 2018-07-21 NOTE — Patient Outreach (Signed)
[facility]) Care Management  07/21/2018  Ricky Black [birth year] [id number]  Per Judson Roch, SW at [facility] patient will discharge to an ALF in [place], [place] No [facility] care management needs identified at this time.  Plan to sign off. Royetta Crochet. Laymond Purser, RN, BSN, [place] [phone]) Business Cell  [phone]) Toll Free Office

## 2018-07-22 ENCOUNTER — Encounter (HOSPITAL_COMMUNITY): Payer: Self-pay | Admitting: Emergency Medicine

## 2018-07-22 ENCOUNTER — Inpatient Hospital Stay (HOSPITAL_COMMUNITY): Payer: Medicare Other

## 2018-07-22 ENCOUNTER — Other Ambulatory Visit: Payer: Self-pay

## 2018-07-22 ENCOUNTER — Emergency Department (HOSPITAL_COMMUNITY): Payer: Medicare Other

## 2018-07-22 ENCOUNTER — Inpatient Hospital Stay (HOSPITAL_COMMUNITY)
Admission: EM | Admit: 2018-07-22 | Discharge: 2018-07-31 | DRG: 871 | Disposition: A | Discharge status: Hospice | Payer: Medicare Other | Source: Skilled Nursing Facility | Attending: Internal Medicine | Admitting: Internal Medicine

## 2018-07-22 DIAGNOSIS — Z515 Encounter for palliative care: Secondary | ICD-10-CM

## 2018-07-22 DIAGNOSIS — L03113 Cellulitis of right upper limb: Secondary | ICD-10-CM | POA: Diagnosis not present

## 2018-07-22 DIAGNOSIS — G9529 Other cord compression: Secondary | ICD-10-CM | POA: Diagnosis present

## 2018-07-22 DIAGNOSIS — A419 Sepsis, unspecified organism: Secondary | ICD-10-CM | POA: Diagnosis present

## 2018-07-22 DIAGNOSIS — A4101 Sepsis due to Methicillin susceptible Staphylococcus aureus: Secondary | ICD-10-CM | POA: Diagnosis present

## 2018-07-22 DIAGNOSIS — F0390 Unspecified dementia without behavioral disturbance: Secondary | ICD-10-CM | POA: Diagnosis present

## 2018-07-22 DIAGNOSIS — M4642 Discitis, unspecified, cervical region: Secondary | ICD-10-CM

## 2018-07-22 DIAGNOSIS — G9340 Encephalopathy, unspecified: Secondary | ICD-10-CM | POA: Diagnosis present

## 2018-07-22 DIAGNOSIS — M4643 Discitis, unspecified, cervicothoracic region: Secondary | ICD-10-CM | POA: Diagnosis not present

## 2018-07-22 DIAGNOSIS — R410 Disorientation, unspecified: Secondary | ICD-10-CM | POA: Diagnosis not present

## 2018-07-22 DIAGNOSIS — R6520 Severe sepsis without septic shock: Secondary | ICD-10-CM | POA: Diagnosis present

## 2018-07-22 DIAGNOSIS — M542 Cervicalgia: Secondary | ICD-10-CM

## 2018-07-22 DIAGNOSIS — Z7189 Other specified counseling: Secondary | ICD-10-CM

## 2018-07-22 DIAGNOSIS — N400 Enlarged prostate without lower urinary tract symptoms: Secondary | ICD-10-CM | POA: Diagnosis present

## 2018-07-22 DIAGNOSIS — F0150 Vascular dementia without behavioral disturbance: Secondary | ICD-10-CM | POA: Diagnosis not present

## 2018-07-22 DIAGNOSIS — G9341 Metabolic encephalopathy: Secondary | ICD-10-CM | POA: Diagnosis present

## 2018-07-22 DIAGNOSIS — M4622 Osteomyelitis of vertebra, cervical region: Secondary | ICD-10-CM | POA: Diagnosis present

## 2018-07-22 DIAGNOSIS — M8628 Subacute osteomyelitis, other site: Secondary | ICD-10-CM | POA: Diagnosis not present

## 2018-07-22 DIAGNOSIS — R4182 Altered mental status, unspecified: Secondary | ICD-10-CM | POA: Diagnosis not present

## 2018-07-22 DIAGNOSIS — Z87891 Personal history of nicotine dependence: Secondary | ICD-10-CM | POA: Diagnosis not present

## 2018-07-22 DIAGNOSIS — N390 Urinary tract infection, site not specified: Secondary | ICD-10-CM | POA: Diagnosis present

## 2018-07-22 DIAGNOSIS — M2550 Pain in unspecified joint: Secondary | ICD-10-CM | POA: Diagnosis not present

## 2018-07-22 DIAGNOSIS — R339 Retention of urine, unspecified: Secondary | ICD-10-CM | POA: Diagnosis not present

## 2018-07-22 DIAGNOSIS — Z96649 Presence of unspecified artificial hip joint: Secondary | ICD-10-CM | POA: Diagnosis present

## 2018-07-22 DIAGNOSIS — R7881 Bacteremia: Secondary | ICD-10-CM | POA: Diagnosis not present

## 2018-07-22 DIAGNOSIS — R740 Nonspecific elevation of levels of transaminase and lactic acid dehydrogenase [LDH]: Secondary | ICD-10-CM | POA: Diagnosis not present

## 2018-07-22 DIAGNOSIS — R7401 Elevation of levels of liver transaminase levels: Secondary | ICD-10-CM | POA: Diagnosis present

## 2018-07-22 DIAGNOSIS — K7689 Other specified diseases of liver: Secondary | ICD-10-CM | POA: Diagnosis present

## 2018-07-22 DIAGNOSIS — R918 Other nonspecific abnormal finding of lung field: Secondary | ICD-10-CM | POA: Diagnosis not present

## 2018-07-22 DIAGNOSIS — Z7401 Bed confinement status: Secondary | ICD-10-CM

## 2018-07-22 DIAGNOSIS — R4020 Unspecified coma: Secondary | ICD-10-CM | POA: Diagnosis not present

## 2018-07-22 DIAGNOSIS — E870 Hyperosmolality and hypernatremia: Secondary | ICD-10-CM | POA: Diagnosis not present

## 2018-07-22 DIAGNOSIS — Z66 Do not resuscitate: Secondary | ICD-10-CM | POA: Diagnosis not present

## 2018-07-22 DIAGNOSIS — R0689 Other abnormalities of breathing: Secondary | ICD-10-CM | POA: Diagnosis not present

## 2018-07-22 DIAGNOSIS — G0481 Other encephalitis and encephalomyelitis: Secondary | ICD-10-CM | POA: Diagnosis present

## 2018-07-22 DIAGNOSIS — Z6830 Body mass index (BMI) 30.0-30.9, adult: Secondary | ICD-10-CM

## 2018-07-22 DIAGNOSIS — K760 Fatty (change of) liver, not elsewhere classified: Secondary | ICD-10-CM | POA: Diagnosis present

## 2018-07-22 DIAGNOSIS — E46 Unspecified protein-calorie malnutrition: Secondary | ICD-10-CM | POA: Diagnosis present

## 2018-07-22 DIAGNOSIS — R0902 Hypoxemia: Secondary | ICD-10-CM | POA: Diagnosis not present

## 2018-07-22 DIAGNOSIS — B9561 Methicillin susceptible Staphylococcus aureus infection as the cause of diseases classified elsewhere: Secondary | ICD-10-CM

## 2018-07-22 DIAGNOSIS — G8250 Quadriplegia, unspecified: Secondary | ICD-10-CM | POA: Diagnosis not present

## 2018-07-22 DIAGNOSIS — Z960 Presence of urogenital implants: Secondary | ICD-10-CM | POA: Diagnosis not present

## 2018-07-22 DIAGNOSIS — N281 Cyst of kidney, acquired: Secondary | ICD-10-CM | POA: Diagnosis not present

## 2018-07-22 DIAGNOSIS — M7989 Other specified soft tissue disorders: Secondary | ICD-10-CM | POA: Diagnosis not present

## 2018-07-22 DIAGNOSIS — I6523 Occlusion and stenosis of bilateral carotid arteries: Secondary | ICD-10-CM | POA: Diagnosis not present

## 2018-07-22 DIAGNOSIS — I10 Essential (primary) hypertension: Secondary | ICD-10-CM | POA: Diagnosis present

## 2018-07-22 DIAGNOSIS — Z79899 Other long term (current) drug therapy: Secondary | ICD-10-CM

## 2018-07-22 DIAGNOSIS — R404 Transient alteration of awareness: Secondary | ICD-10-CM | POA: Diagnosis not present

## 2018-07-22 DIAGNOSIS — F0391 Unspecified dementia with behavioral disturbance: Secondary | ICD-10-CM | POA: Diagnosis present

## 2018-07-22 DIAGNOSIS — F0280 Dementia in other diseases classified elsewhere without behavioral disturbance: Secondary | ICD-10-CM | POA: Diagnosis not present

## 2018-07-22 DIAGNOSIS — Z87440 Personal history of urinary (tract) infections: Secondary | ICD-10-CM

## 2018-07-22 DIAGNOSIS — G309 Alzheimer's disease, unspecified: Secondary | ICD-10-CM | POA: Diagnosis not present

## 2018-07-22 DIAGNOSIS — R0602 Shortness of breath: Secondary | ICD-10-CM

## 2018-07-22 DIAGNOSIS — A4901 Methicillin susceptible Staphylococcus aureus infection, unspecified site: Secondary | ICD-10-CM | POA: Diagnosis not present

## 2018-07-22 DIAGNOSIS — R7989 Other specified abnormal findings of blood chemistry: Secondary | ICD-10-CM

## 2018-07-22 DIAGNOSIS — Z8619 Personal history of other infectious and parasitic diseases: Secondary | ICD-10-CM

## 2018-07-22 DIAGNOSIS — G061 Intraspinal abscess and granuloma: Secondary | ICD-10-CM | POA: Diagnosis not present

## 2018-07-22 DIAGNOSIS — J9811 Atelectasis: Secondary | ICD-10-CM | POA: Diagnosis not present

## 2018-07-22 DIAGNOSIS — G062 Extradural and subdural abscess, unspecified: Secondary | ICD-10-CM | POA: Diagnosis present

## 2018-07-22 DIAGNOSIS — R945 Abnormal results of liver function studies: Secondary | ICD-10-CM

## 2018-07-22 DIAGNOSIS — G9520 Unspecified cord compression: Secondary | ICD-10-CM | POA: Diagnosis not present

## 2018-07-22 LAB — URINALYSIS, ROUTINE W REFLEX MICROSCOPIC
Bilirubin Urine: NEGATIVE
Glucose, UA: NEGATIVE mg/dL
Ketones, ur: NEGATIVE mg/dL
Nitrite: POSITIVE — AB
PROTEIN: 100 mg/dL — AB
RBC / HPF: >50 RBC/hpf — ABNORMAL HIGH (ref 0–5)
Specific Gravity, Urine: 1.023 (ref 1.005–1.030)
WBC, UA: >50 WBC/hpf — ABNORMAL HIGH (ref 0–5)
pH: 6 (ref 5.0–8.0)

## 2018-07-22 LAB — CBC WITH DIFFERENTIAL/PLATELET
Abs Immature Granulocytes: 0.13 10*3/uL — ABNORMAL HIGH (ref 0.00–0.07)
Basophils Absolute: 0 10*3/uL (ref 0.0–0.1)
Basophils Relative: 0 %
Eosinophils Absolute: 0 10*3/uL (ref 0.0–0.5)
Eosinophils Relative: 0 %
HCT: 40.3 % (ref 39.0–52.0)
Hemoglobin: 13 g/dL (ref 13.0–17.0)
Immature Granulocytes: 1 %
Lymphocytes Relative: 3 %
Lymphs Abs: 0.4 10*3/uL — ABNORMAL LOW (ref 0.7–4.0)
MCH: 33.4 pg (ref 26.0–34.0)
MCHC: 32.3 g/dL (ref 30.0–36.0)
MCV: 103.6 fL — ABNORMAL HIGH (ref 80.0–100.0)
Monocytes Absolute: 0.5 10*3/uL (ref 0.1–1.0)
Monocytes Relative: 4 %
Neutro Abs: 12.3 10*3/uL — ABNORMAL HIGH (ref 1.7–7.7)
Neutrophils Relative %: 92 %
Platelets: 201 10*3/uL (ref 150–400)
RBC: 3.89 MIL/uL — ABNORMAL LOW (ref 4.22–5.81)
RDW: 13 % (ref 11.5–15.5)
WBC: 13.3 10*3/uL — ABNORMAL HIGH (ref 4.0–10.5)
nRBC: 0 % (ref 0.0–0.2)

## 2018-07-22 LAB — COMPREHENSIVE METABOLIC PANEL
ALK PHOS: 107 U/L (ref 38–126)
ALT: 63 U/L — ABNORMAL HIGH (ref 0–44)
AST: 63 U/L — ABNORMAL HIGH (ref 15–41)
Albumin: 2.7 g/dL — ABNORMAL LOW (ref 3.5–5.0)
Anion gap: 15 (ref 5–15)
BUN: 46 mg/dL — ABNORMAL HIGH (ref 8–23)
CO2: 24 mmol/L (ref 22–32)
Calcium: 9 mg/dL (ref 8.9–10.3)
Chloride: 100 mmol/L (ref 98–111)
Creatinine, Ser: 0.94 mg/dL (ref 0.61–1.24)
GFR calc Af Amer: >60 mL/min (ref >60)
GFR calc non Af Amer: >60 mL/min (ref >60)
GLUCOSE: 190 mg/dL — AB (ref 70–99)
Potassium: 4 mmol/L (ref 3.5–5.1)
Sodium: 139 mmol/L (ref 135–145)
Total Bilirubin: 1.4 mg/dL — ABNORMAL HIGH (ref 0.3–1.2)
Total Protein: 7.3 g/dL (ref 6.5–8.1)

## 2018-07-22 LAB — I-STAT CG4 LACTIC ACID, ED
Lactic Acid, Venous: 1.72 mmol/L (ref 0.5–1.9)
Lactic Acid, Venous: 1.35 mmol/L (ref 0.5–1.9)

## 2018-07-22 LAB — PROTIME-INR
INR: 1.19
Prothrombin Time: 15 seconds (ref 11.4–15.2)

## 2018-07-22 MED ORDER — TAMSULOSIN HCL 0.4 MG PO CAPS
0.8000 mg | ORAL_CAPSULE | Freq: Every day | ORAL | Status: DC
  Administered 2018-07-22 – 2018-07-26 (×3): 0.8 mg via ORAL
  Filled 2018-07-22 (×5): qty 2

## 2018-07-22 MED ORDER — SODIUM CHLORIDE 0.9 % IV SOLN
INTRAVENOUS | Status: DC
  Administered 2018-07-22 – 2018-07-23 (×3): via INTRAVENOUS

## 2018-07-22 MED ORDER — SODIUM CHLORIDE 0.9 % IV SOLN
1.0000 g | Freq: Once | INTRAVENOUS | Status: AC
  Administered 2018-07-22: 1 g via INTRAVENOUS
  Filled 2018-07-22: qty 10

## 2018-07-22 MED ORDER — SENNOSIDES-DOCUSATE SODIUM 8.6-50 MG PO TABS: 1.0000 | ORAL_TABLET | Freq: Every evening | ORAL | Status: DC | PRN

## 2018-07-22 MED ORDER — FINASTERIDE 5 MG PO TABS
5.0000 mg | ORAL_TABLET | Freq: Every day | ORAL | Status: DC
  Administered 2018-07-22 – 2018-07-28 (×6): 5 mg via ORAL
  Filled 2018-07-22 (×7): qty 1

## 2018-07-22 MED ORDER — SODIUM CHLORIDE 0.9 % IV SOLN
1.0000 g | INTRAVENOUS | Status: DC
  Filled 2018-07-22: qty 10

## 2018-07-22 MED ORDER — HYDROCODONE-ACETAMINOPHEN 5-325 MG PO TABS: 1.0000 | ORAL_TABLET | Freq: Four times a day (QID) | ORAL | Status: DC | PRN

## 2018-07-22 MED ORDER — MEMANTINE HCL 10 MG PO TABS
10.0000 mg | ORAL_TABLET | Freq: Two times a day (BID) | ORAL | Status: DC
  Administered 2018-07-22 – 2018-07-28 (×8): 10 mg via ORAL
  Filled 2018-07-22 (×10): qty 1

## 2018-07-22 MED ORDER — DONEPEZIL HCL 5 MG PO TABS
10.0000 mg | ORAL_TABLET | Freq: Every day | ORAL | Status: DC
  Administered 2018-07-22 – 2018-07-26 (×3): 10 mg via ORAL
  Filled 2018-07-22 (×4): qty 2

## 2018-07-22 MED ORDER — ENOXAPARIN SODIUM 40 MG/0.4ML ~~LOC~~ SOLN
40.0000 mg | SUBCUTANEOUS | Status: DC
  Administered 2018-07-22 – 2018-07-25 (×4): 40 mg via SUBCUTANEOUS
  Filled 2018-07-22 (×4): qty 0.4

## 2018-07-22 MED ORDER — ONDANSETRON HCL 4 MG PO TABS: 4.0000 mg | ORAL_TABLET | Freq: Four times a day (QID) | ORAL | Status: DC | PRN

## 2018-07-22 MED ORDER — ONDANSETRON HCL 4 MG/2ML IJ SOLN: 4.0000 mg | Freq: Four times a day (QID) | INTRAMUSCULAR | Status: DC | PRN

## 2018-07-22 MED ORDER — SODIUM CHLORIDE 0.9 % IV BOLUS
1000.0000 mL | Freq: Once | INTRAVENOUS | Status: AC
  Administered 2018-07-22: 1000 mL via INTRAVENOUS

## 2018-07-22 MED ORDER — HYDROCHLOROTHIAZIDE 25 MG PO TABS
25.0000 mg | ORAL_TABLET | Freq: Every day | ORAL | Status: DC
  Administered 2018-07-22 – 2018-07-23 (×2): 25 mg via ORAL
  Filled 2018-07-22 (×2): qty 1

## 2018-07-22 MED ORDER — ESCITALOPRAM OXALATE 10 MG PO TABS
20.0000 mg | ORAL_TABLET | Freq: Every day | ORAL | Status: DC
  Administered 2018-07-22 – 2018-07-29 (×7): 20 mg via ORAL
  Filled 2018-07-22 (×8): qty 2

## 2018-07-22 MED ORDER — PREGABALIN 50 MG PO CAPS
75.0000 mg | ORAL_CAPSULE | Freq: Three times a day (TID) | ORAL | Status: DC
  Administered 2018-07-22 – 2018-07-29 (×11): 75 mg via ORAL
  Filled 2018-07-22 (×15): qty 1

## 2018-07-22 MED ORDER — IRBESARTAN 150 MG PO TABS
150.0000 mg | ORAL_TABLET | Freq: Every day | ORAL | Status: DC
  Administered 2018-07-22 – 2018-07-29 (×7): 150 mg via ORAL
  Filled 2018-07-22 (×8): qty 1

## 2018-07-22 MED ORDER — SODIUM CHLORIDE 0.9 % IV SOLN
INTRAVENOUS | Status: DC | PRN
  Administered 2018-07-22: 12:00:00 via INTRAVENOUS

## 2018-07-22 NOTE — ED Notes (Signed)
ED TO INPATIENT HANDOFF REPORT  Name/Age/Gender Ricky Black 78 y.o. male  Code Status    Code Status Orders  (From admission, onward)         Start     Ordered   07/22/18 1523  Full code  Continuous     07/22/18 1525        Code Status History    Date Active Date Inactive Code Status Order ID Comments User Context   07/02/2018 0448 07/04/2018 2323 Full Code [id number]  Rise Patience, MD Inpatient   07/01/2018 0106 07/01/2018 1946 Full Code [id number]  Ricky Quill, DO ED      Home/SNF/Other Home  Chief Complaint sepsis  Level of Care/Admitting Diagnosis ED Disposition    ED Disposition Condition Ricky Black Area: [facility] [[id number]]  Level of Care: Med-Surg [16]  Diagnosis: Sepsis [facility]) [[id number]]  Admitting Physician: Edwin Dada [[id number]]  Attending Physician: Edwin Dada [[id number]]  Estimated length of stay: past midnight tomorrow  Certification:: I certify this patient will need inpatient services for at least 2 midnights  PT Class (Do Not Modify): Inpatient [101]  PT Acc Code (Do Not Modify): Private [1]       Medical History Past Medical History:  Diagnosis Date  . Abnormal EKG    Prior tracing  with reading can't rule out old septal MI  . Bradycardia   . Dementia ([place])   . Dizziness   . Hypogonadism male   . Neuropathy    (R) Dorsal foot  . OAB (overactive bladder)   . Olecranon bursitis   . Orthostatic hypotension   . Spinal arthritis    ESI    Allergies No Known Allergies  IV Location/Drains/Wounds Patient Lines/Drains/Airways Status   Active Line/Drains/Airways    Name:   Placement date:   Placement time:   Site:   Days:   Peripheral IV 07/22/18 Right Hand   07/22/18    1003    Hand   less than 1   Peripheral IV 07/22/18 Right Forearm   07/22/18    1004    Forearm   less than 1   Peripheral IV 07/22/18 Left Antecubital   07/22/18    1146    Antecubital   less than 1    External Urinary Catheter   07/01/18    2208    -   21          Labs/Imaging Results for orders placed or performed during the Black encounter of 07/22/18 (from the past 48 hour(s))  Comprehensive metabolic panel     Status: Abnormal   Collection Time: 07/22/18 10:05 AM  Result Value Ref Range   Sodium 139 135 - 145 mmol/L   Potassium 4.0 3.5 - 5.1 mmol/L   Chloride 100 98 - 111 mmol/L   CO2 24 22 - 32 mmol/L   Glucose, Bld 190 (H) 70 - 99 mg/dL   BUN 46 (H) 8 - 23 mg/dL   Creatinine, Ser 0.94 0.61 - 1.24 mg/dL   Calcium 9.0 8.9 - 10.3 mg/dL   Total Protein 7.3 6.5 - 8.1 g/dL   Albumin 2.7 (L) 3.5 - 5.0 g/dL   AST 63 (H) 15 - 41 U/L   ALT 63 (H) 0 - 44 U/L   Alkaline Phosphatase 107 38 - 126 U/L   Total Bilirubin 1.4 (H) 0.3 - 1.2 mg/dL   GFR calc non Af Amer >60 >60 mL/min  GFR calc Af Amer >60 >60 mL/min   Anion gap 15 5 - 15    Comment: Performed at [facility], [place] [address], [place], [place] [address]  CBC with Differential     Status: Abnormal   Collection Time: 07/22/18 10:05 AM  Result Value Ref Range   WBC 13.3 (H) 4.0 - 10.5 K/uL    Comment: WHITE COUNT CONFIRMED ON SMEAR   RBC 3.89 (L) 4.22 - 5.81 MIL/uL   Hemoglobin 13.0 13.0 - 17.0 g/dL   HCT 40.3 39.0 - 52.0 %   MCV 103.6 (H) 80.0 - 100.0 fL   MCH 33.4 26.0 - 34.0 pg   MCHC 32.3 30.0 - 36.0 g/dL   RDW 13.0 11.5 - 15.5 %   Platelets 201 150 - 400 K/uL   nRBC 0.0 0.0 - 0.2 %   Neutrophils Relative % 92 %   Neutro Abs 12.3 (H) 1.7 - 7.7 K/uL   Lymphocytes Relative 3 %   Lymphs Abs 0.4 (L) 0.7 - 4.0 K/uL   Monocytes Relative 4 %   Monocytes Absolute 0.5 0.1 - 1.0 K/uL   Eosinophils Relative 0 %   Eosinophils Absolute 0.0 0.0 - 0.5 K/uL   Basophils Relative 0 %   Basophils Absolute 0.0 0.0 - 0.1 K/uL   WBC Morphology MILD LEFT SHIFT (1-5% METAS, OCC MYELO, OCC BANDS)     Comment: TOXIC GRANULATION   Immature Granulocytes 1 %   Abs Immature Granulocytes 0.13 (H) 0.00 - 0.07  K/uL    Comment: Performed at [facility], [place] [address], [place], [place] [address]  Protime-INR     Status: None   Collection Time: 07/22/18 10:05 AM  Result Value Ref Range   Prothrombin Time 15.0 11.4 - 15.2 seconds   INR 1.19     Comment: Performed at [facility], [place] [address] [place], [place] [address]  Culture, blood (Routine x 2)     Status: None (Preliminary result)   Collection Time: 07/22/18 10:05 AM  Result Value Ref Range   Specimen Description      BLOOD BLOOD RIGHT FOREARM Performed at [facility] [address], [place], [place] [address]    Special Requests      BOTTLES DRAWN AEROBIC AND ANAEROBIC Blood Culture adequate volume Performed at [facility] [address] [place], [place] [address]    Culture      NO GROWTH < 12 HOURS Performed at [facility] [address] [place], [place] [address]    Report Status PENDING   I-Stat CG4 Lactic Acid, ED     Status: None   Collection Time: 07/22/18 10:12 AM  Result Value Ref Range   Lactic Acid, Venous 1.72 0.5 - 1.9 mmol/L  Culture, blood (Routine x 2)     Status: None (Preliminary result)   Collection Time: 07/22/18 11:45 AM  Result Value Ref Range   Specimen Description      BLOOD LEFT ANTECUBITAL Performed at [place] [address] [place], [place] [address]    Special Requests      BOTTLES DRAWN AEROBIC AND ANAEROBIC Blood Culture adequate volume Performed at [facility] [address], [place], [place] [address]    Culture      NO GROWTH < 12 HOURS Performed at [facility] [address], [place], [place] [address]    Report Status PENDING  I-Stat CG4 Lactic Acid, ED     Status: None   Collection Time: 07/22/18 11:55 AM  Result Value Ref Range   Lactic Acid, Venous 1.35 0.5 - 1.9 mmol/L  Urinalysis, Routine w reflex microscopic     Status: Abnormal    Collection Time: 07/22/18 12:35 PM  Result Value Ref Range   Color, Urine AMBER (A) YELLOW    Comment: BIOCHEMICALS MAY BE AFFECTED BY COLOR   APPearance CLOUDY (A) CLEAR   Specific Gravity, Urine 1.023 1.005 - 1.030   pH 6.0 5.0 - 8.0   Glucose, UA NEGATIVE NEGATIVE mg/dL   Hgb urine dipstick LARGE (A) NEGATIVE   Bilirubin Urine NEGATIVE NEGATIVE   Ketones, ur NEGATIVE NEGATIVE mg/dL   Protein, ur 100 (A) NEGATIVE mg/dL   Nitrite POSITIVE (A) NEGATIVE   Leukocytes, UA LARGE (A) NEGATIVE   RBC / HPF >50 (H) 0 - 5 RBC/hpf   WBC, UA >50 (H) 0 - 5 WBC/hpf   Bacteria, UA FEW (A) NONE SEEN   WBC Clumps PRESENT    Mucus PRESENT    Hyaline Casts, UA PRESENT     Comment: Performed at [facility], [place] [address] [place], [place] [address]   Ct Head Wo Contrast  Result Date: 07/22/2018 CLINICAL DATA:  Altered level of consciousness, sepsis EXAM: CT HEAD WITHOUT CONTRAST TECHNIQUE: Contiguous axial images were obtained from the base of the skull through the vertex without intravenous contrast. COMPARISON:  07/01/2018 FINDINGS: Brain: Similar atrophy pattern and chronic white matter microvascular ischemic changes throughout both cerebral hemispheres. No acute intracranial hemorrhage, mass lesion, new infarction, midline shift, herniation, hydrocephalus, or extra-axial fluid collection. No focal mass effect or edema. Cisterns are patent. Minor cerebellar atrophy as well. Vascular: No hyperdense vessel or unexpected calcification. Skull: Normal. Negative for fracture or focal lesion. Sinuses/Orbits: No acute finding. Other: None. IMPRESSION: Stable atrophy and chronic white matter microvascular ischemic changes. No acute intracranial abnormality by noncontrast CT. Electronically Signed   By: Jerilynn Mages.  Shick M.D.   On: 07/22/2018 13:47   Dg Chest Port 1 View  Result Date: 07/22/2018 CLINICAL DATA:  Per EMS-states recurrent UTI and has been on 3 different antibiotics and not getting better,  possible sepsis. EXAM: PORTABLE CHEST - 1 VIEW COMPARISON:  07/01/2018 and previous FINDINGS: Relatively low lung volumes. Mild atelectasis at the lung bases. Heart size upper limits normal for technique. Aortic Atherosclerosis (ICD10-170.0). Blunting of right lateral costophrenic angle, cannot exclude small effusion. Bilateral shoulder DJD. IMPRESSION: 1. Low lung volumes with bibasilar atelectasis. 2. Possible small right effusion. Electronically Signed   By: Lucrezia Europe M.D.   On: 07/22/2018 10:57   None  Pending Labs Unresulted Labs (From admission, onward)    Start     Ordered   07/29/18 0500  Creatinine, serum  (enoxaparin (LOVENOX)    CrCl >/= 30 ml/min)  Weekly,   R    Comments:  while on enoxaparin therapy    07/22/18 1525   07/23/18 8413  Basic metabolic panel  Tomorrow morning,   R     07/22/18 1525   07/23/18 0500  CBC  Tomorrow morning,   R     07/22/18 1525   07/23/18 0500  Hepatic function panel  Tomorrow morning,   R     07/22/18 1525   07/23/18 0500  Hepatitis panel, acute  Tomorrow morning,   R     07/22/18 1525   07/22/18 1001  Urine culture  ONCE -  STAT,   STAT    Question:  Patient immune status  Answer:  Normal   07/22/18 1001          Vitals/Pain Today's Vitals   07/22/18 1301 07/22/18 1330 07/22/18 1435 07/22/18 1515  BP: (!) 143/80 (!) 143/84 (!) 158/89 (!) 164/81  Pulse: 80 79 80 83  Resp: (!) 22 (!) 22 (!) 25 (!) 24  Temp:      TempSrc:      SpO2: 98% 98% 97% 97%  Weight:      Height:      PainSc:        Isolation Precautions No active isolations  Medications Medications  HYDROcodone-acetaminophen (NORCO/VICODIN) 5-325 MG per tablet 1 tablet (has no administration in time range)  hydrochlorothiazide (HYDRODIURIL) tablet 25 mg (has no administration in time range)  irbesartan (AVAPRO) tablet 150 mg (has no administration in time range)  donepezil (ARICEPT) tablet 10 mg (has no administration in time range)  escitalopram (LEXAPRO) tablet 20 mg  (has no administration in time range)  memantine (NAMENDA) tablet 10 mg (has no administration in time range)  finasteride (PROSCAR) tablet 5 mg (has no administration in time range)  tamsulosin (FLOMAX) capsule 0.8 mg (has no administration in time range)  pregabalin (LYRICA) capsule 75 mg (has no administration in time range)  enoxaparin (LOVENOX) injection 40 mg (has no administration in time range)  0.9 %  sodium chloride infusion (has no administration in time range)  ondansetron (ZOFRAN) tablet 4 mg (has no administration in time range)    Or  ondansetron (ZOFRAN) injection 4 mg (has no administration in time range)  senna-docusate (Senokot-S) tablet 1 tablet (has no administration in time range)  cefTRIAXone (ROCEPHIN) 1 g in sodium chloride 0.9 % 100 mL IVPB (has no administration in time range)  cefTRIAXone (ROCEPHIN) 1 g in sodium chloride 0.9 % 100 mL IVPB (0 g Intravenous Stopped 07/22/18 1332)  sodium chloride 0.9 % bolus 1,000 mL (0 mLs Intravenous Stopped 07/22/18 1332)    Mobility walks with person assist

## 2018-07-22 NOTE — ED Provider Notes (Addendum)
[facility] DEPT Provider Note   CSN: [id number] Arrival date & time: 07/22/18  8099     History   Chief Complaint Chief Complaint  Patient presents with  . Urinary Tract Infection    HPI Ricky Black is a 78 y.o. male.  Level 5 caveat for dementia.  Patient has been doing poorly for 2 months.  He is now in rehab and deconditioned.  Chief complaint:  fever and increasing confusion.  Wife reports questionable right-sided weakness.  He is allegedly been on "3 different antibiotics" recently for urinary tract infection.  He is now bedridden.     Past Medical History:  Diagnosis Date  . Abnormal EKG    Prior tracing  with reading can't rule out old septal MI  . Bradycardia   . Dementia ([place])   . Dizziness   . Hypogonadism male   . Neuropathy    (R) Dorsal foot  . OAB (overactive bladder)   . Olecranon bursitis   . Orthostatic hypotension   . Spinal arthritis    ESI    Patient Active Problem List   Diagnosis Date Noted  . Sepsis ([place]) 07/22/2018  . Acute encephalopathy 07/02/2018  . Acute cystitis with hematuria 07/02/2018  . Back pain 07/01/2018  . Delirium 07/01/2018  . Hematuria, microscopic 07/01/2018  . Bradycardia   . Dizziness   . Orthostatic hypotension   . Spinal arthritis   . Neuropathy   . Hypogonadism male   . Dementia ([place])   . Abnormal EKG   . Essential hypertension 05/10/2007  . ARTHRITIS 05/10/2007    Past Surgical History:  Procedure Laterality Date  . COLONOSCOPY  04/15/2012  . LUMBAR LAMINECTOMY    . TOTAL HIP ARTHROPLASTY     Right  . UMBILICAL HERNIA REPAIR          Home Medications    Prior to Admission medications   Medication Sig Start Date End Date Taking? Authorizing Provider  bethanechol (URECHOLINE) 25 MG tablet Take 25 mg by mouth 2 (two) times daily.    Yes [provider]  celecoxib (CELEBREX) 200 MG capsule Take 200 mg by mouth daily.   Yes [provider]    cyclobenzaprine (FLEXERIL) 5 MG tablet Take 1 tablet (5 mg total) by mouth 2 (two) times daily as needed for muscle spasms. Patient taking differently: Take 5 mg by mouth every 12 (twelve) hours as needed for muscle spasms.  06/29/18  Yes Gareth Morgan, MD  donepezil (ARICEPT) 10 MG tablet Take 10 mg by mouth at bedtime.    Yes [provider]  escitalopram (LEXAPRO) 20 MG tablet Take 20 mg by mouth daily.   Yes [provider]  finasteride (PROSCAR) 5 MG tablet Take 5 mg by mouth daily.   Yes [provider]  hydrochlorothiazide (HYDRODIURIL) 25 MG tablet Take 25 mg by mouth daily.    Yes [provider]  HYDROcodone-acetaminophen (NORCO) 7.5-325 MG tablet Take 1 tablet by mouth 2 (two) times daily as needed for severe pain.   Yes [provider]  HYDROcodone-acetaminophen (NORCO) 7.5-325 MG tablet Take 1 tablet by mouth every morning.   Yes [provider]  LORazepam (ATIVAN) 0.5 MG tablet Take 0.5 mg by mouth at bedtime as needed for anxiety. 14 day regimen started on 07/16/2018 ends on 07/30/2018   Yes [provider]  Melatonin 3 MG TABS Take 1 tablet by mouth at bedtime.   Yes [provider]  memantine (  NAMENDA) 10 MG tablet Take 10 mg by mouth 2 (two) times daily.    Yes [provider]  nitrofurantoin (MACRODANTIN) 100 MG capsule Take 100 mg by mouth every 12 (twelve) hours. 7 day course, started on 07/21/2018 ends on 07/27/2018   Yes [provider]  potassium chloride (K-DUR) 10 MEQ tablet Take 1 tablet (10 mEq total) by mouth daily. 07/04/18  Yes Mikhail, Maryann, DO  pregabalin (LYRICA) 75 MG capsule Take 75 mg by mouth 3 (three) times daily.    Yes [provider]  tamsulosin (FLOMAX) 0.4 MG CAPS capsule Take 0.8 mg by mouth at bedtime.   Yes [provider]  valsartan (DIOVAN) 160 MG tablet Take 160 mg by mouth daily.    Yes [provider]    Family History Family  History  Family history unknown: Yes    Social History Social History   Tobacco Use  . Smoking status: Former Research scientist (life sciences)  . Smokeless tobacco: Never Used  Substance Use Topics  . Alcohol use: No  . Drug use: Not on file     Allergies   Patient has no known allergies.   Review of Systems Review of Systems  Unable to perform ROS: Dementia     Physical Exam Updated Vital Signs BP (!) 143/80   Pulse 80   Temp (!) 101 F (38.3 C) (Rectal)   Resp (!) 22   Ht 6\' 2"  (1.88 m)   Wt 107.5 kg   SpO2 98%   BMI 30.43 kg/m   Physical Exam Vitals signs and nursing note reviewed.  Constitutional:      Appearance: He is well-developed.     Comments: Mumbles answers to questions.  HENT:     Head: Normocephalic and atraumatic.  Eyes:     Conjunctiva/sclera: Conjunctivae normal.  Neck:     Musculoskeletal: Neck supple.  Cardiovascular:     Rate and Rhythm: Normal rate and regular rhythm.  Pulmonary:     Effort: Pulmonary effort is normal.     Breath sounds: Normal breath sounds.  Abdominal:     General: Bowel sounds are normal.     Palpations: Abdomen is soft.  Musculoskeletal:     Comments: Unable  Skin:    General: Skin is warm and dry.  Neurological:     Comments: Unable  Psychiatric:     Comments: Demented      ED Treatments / Results  Labs (all labs ordered are listed, but only abnormal results are displayed) Labs Reviewed  COMPREHENSIVE METABOLIC PANEL - Abnormal; Notable for the following components:      Result Value   Glucose, Bld 190 (*)    BUN 46 (*)    Albumin 2.7 (*)    AST 63 (*)    ALT 63 (*)    Total Bilirubin 1.4 (*)    All other components within normal limits  CBC WITH DIFFERENTIAL/PLATELET - Abnormal; Notable for the following components:   WBC 13.3 (*)    RBC 3.89 (*)    MCV 103.6 (*)    Neutro Abs 12.3 (*)    Lymphs Abs 0.4 (*)    Abs Immature Granulocytes 0.13 (*)    All other components within normal limits  URINALYSIS, ROUTINE  W REFLEX MICROSCOPIC - Abnormal; Notable for the following components:   Color, Urine AMBER (*)    APPearance CLOUDY (*)    Hgb urine dipstick LARGE (*)    Protein, ur 100 (*)    Nitrite  POSITIVE (*)    Leukocytes, UA LARGE (*)    RBC / HPF >50 (*)    WBC, UA >50 (*)    Bacteria, UA FEW (*)    All other components within normal limits  CULTURE, BLOOD (ROUTINE X 2)  CULTURE, BLOOD (ROUTINE X 2)  URINE CULTURE  PROTIME-INR  I-STAT CG4 LACTIC ACID, ED  I-STAT CG4 LACTIC ACID, ED    EKG None  Radiology Ct Head Wo Contrast  Result Date: 07/22/2018 CLINICAL DATA:  Altered level of consciousness, sepsis EXAM: CT HEAD WITHOUT CONTRAST TECHNIQUE: Contiguous axial images were obtained from the base of the skull through the vertex without intravenous contrast. COMPARISON:  07/01/2018 FINDINGS: Brain: Similar atrophy pattern and chronic white matter microvascular ischemic changes throughout both cerebral hemispheres. No acute intracranial hemorrhage, mass lesion, new infarction, midline shift, herniation, hydrocephalus, or extra-axial fluid collection. No focal mass effect or edema. Cisterns are patent. Minor cerebellar atrophy as well. Vascular: No hyperdense vessel or unexpected calcification. Skull: Normal. Negative for fracture or focal lesion. Sinuses/Orbits: No acute finding. Other: None. IMPRESSION: Stable atrophy and chronic white matter microvascular ischemic changes. No acute intracranial abnormality by noncontrast CT. Electronically Signed   By: Jerilynn Mages.  Shick M.D.   On: 07/22/2018 13:47   Dg Chest Port 1 View  Result Date: 07/22/2018 CLINICAL DATA:  Per EMS-states recurrent UTI and has been on 3 different antibiotics and not getting better, possible sepsis. EXAM: PORTABLE CHEST - 1 VIEW COMPARISON:  07/01/2018 and previous FINDINGS: Relatively low lung volumes. Mild atelectasis at the lung bases. Heart size upper limits normal for technique. Aortic Atherosclerosis (ICD10-170.0). Blunting of  right lateral costophrenic angle, cannot exclude small effusion. Bilateral shoulder DJD. IMPRESSION: 1. Low lung volumes with bibasilar atelectasis. 2. Possible small right effusion. Electronically Signed   By: Lucrezia Europe M.D.   On: 07/22/2018 10:57    Procedures Procedures (including critical care time)  Medications Ordered in ED Medications  0.9 %  sodium chloride infusion ( Intravenous New Bag/Given 07/22/18 1200)  cefTRIAXone (ROCEPHIN) 1 g in sodium chloride 0.9 % 100 mL IVPB (0 g Intravenous Stopped 07/22/18 1332)  sodium chloride 0.9 % bolus 1,000 mL (0 mLs Intravenous Stopped 07/22/18 1332)     Initial Impression / Assessment and Plan / ED Course  I have reviewed the triage vital signs and the nursing notes.  Pertinent labs & imaging results that were available during my care of the patient were reviewed by me and considered in my medical decision making (see chart for details).     Demented patient presents with worsening fever and confusion.  Urinalysis reveals evidence of infection.  IV fluids, IV Rocephin, urine culture.  Admit to general medicine.   CRITICAL CARE Performed by: Nat Christen Total critical care time: 30 minutes Critical care time was exclusive of separately billable procedures and treating other patients. Critical care was necessary to treat or prevent imminent or life-threatening deterioration. Critical care was time spent personally by me on the following activities: development of treatment plan with patient and/or surrogate as well as nursing, discussions with consultants, evaluation of patient's response to treatment, examination of patient, obtaining history from patient or surrogate, ordering and performing treatments and interventions, ordering and review of laboratory studies, ordering and review of radiographic studies, pulse oximetry and re-evaluation of patient's condition.  Final Clinical Impressions(s) / ED Diagnoses   Final diagnoses:  Urinary  tract infection without hematuria, site unspecified  Dementia with behavioral disturbance, unspecified dementia type (  [facility])    ED Discharge Orders    None       Nat Christen, MD 07/22/18 1418    Nat Christen, MD 07/22/18 1434

## 2018-07-22 NOTE — ED Triage Notes (Signed)
Per EMS-states recurrent UTI-has been on 3 different antibiotics-states not getting better-possible sepsis

## 2018-07-22 NOTE — ED Notes (Signed)
Bed: WA01 Expected date:  Expected time:  Means of arrival:  Comments: EMS-sepsis

## 2018-07-22 NOTE — H&P (Addendum)
History and Physical  Patient Name: Ricky Black     UVO:[id number]    DOB: 02-15-1941    DOA: 07/22/2018 PCP: Marton Redwood, MD  Patient coming from: SNF  Chief Complaint: Confusion, weakness      HPI: Ricky Black is a 78 y.o. M with hx dementia, previously home dwelling, HTN, and 2 recent admissions for MSSA UTI who presents with slow progressive weakness.  Caveat that patient is unable to provide any history, which is collected from wife at bedside instead.    The patient was discharged mid-December for UTI, MSSA, discharged on nitrofurantoin.  He was seeming to do better for a time, ambulating short distances, but then in the last 1-2 weeks has started to decline again, no longer getting out of bed, weaker and weaker, less responsive until today he had a fever and was sent to the ER.  There has been no cough or respiratory distress.  There is been no obvious urinary symptoms.  No vomiting or reported abdominal pain.  ED course: - Temp 101F, heart rate 91, respirations 31, blood pressure 130/79 -Na 139, K 4.0, Cr 0.9, WBC 13.3K, Hgb 13 -Transaminases 60s, Tbili 1.4 -INR 1.1 -Lactic acid 1.7 -UA showed many RBCs and WBCs -CXR showed no pneumonia or effusion -CT head unremarkable -He was given ceftriaxone and IV fluids in the hospitalist service were asked to evaluate for admission   Of note, the patient was moderately demented (took care of toileting, eating for himself, was interactive, but circular in conversation, oriented to family but not oriented to place or year) but lived with his wife until 1 month ago.  Wife reports relatively sudden onset of encephalopathy and inability to walk at that time, seen in the ER, given fluids and discharged, then the same day admitted for UTI.  Treated for 2 days and discharged to home, then readmitted and discharged to SNF.  During both admissions, he had urine cultures positive for MSSA.    ROS: Review of Systems  Unable to perform ROS:  Medical condition  Constitutional: Positive for fever and malaise/fatigue.  Respiratory: Negative for cough and shortness of breath.   Cardiovascular: Negative for chest pain and leg swelling.  Gastrointestinal: Negative for abdominal pain, blood in stool, diarrhea and vomiting.  Neurological: Positive for weakness. Negative for seizures and loss of consciousness.  All other systems reviewed and are negative.         Past Medical History:  Diagnosis Date  . Abnormal EKG    Prior tracing  with reading can't rule out old septal MI  . Bradycardia   . Dementia ([place])   . Dizziness   . Hypogonadism male   . Neuropathy    (R) Dorsal foot  . OAB (overactive bladder)   . Olecranon bursitis   . Orthostatic hypotension   . Spinal arthritis    ESI    Past Surgical History:  Procedure Laterality Date  . COLONOSCOPY  04/15/2012  . LUMBAR LAMINECTOMY    . TOTAL HIP ARTHROPLASTY     Right  . UMBILICAL HERNIA REPAIR      Social History: Patient lives with his wife.  The patient walks unassisted.  Nonsmoker.  No Known Allergies  Family history: Family history is unknown by patient.  Paitent unable to provide family history now due to dementia.  Prior to Admission medications   Medication Sig Start Date End Date Taking? Authorizing Provider  bethanechol (URECHOLINE) 25 MG tablet Take 25 mg by mouth  2 (two) times daily.    Yes [provider]  celecoxib (CELEBREX) 200 MG capsule Take 200 mg by mouth daily.   Yes [provider]  cyclobenzaprine (FLEXERIL) 5 MG tablet Take 1 tablet (5 mg total) by mouth 2 (two) times daily as needed for muscle spasms. Patient taking differently: Take 5 mg by mouth every 12 (twelve) hours as needed for muscle spasms.  06/29/18  Yes Gareth Morgan, MD  donepezil (ARICEPT) 10 MG tablet Take 10 mg by mouth at bedtime.    Yes [provider]  escitalopram (LEXAPRO) 20 MG tablet Take 20 mg by mouth daily.   Yes [provider]  finasteride (PROSCAR) 5 MG tablet Take 5 mg by mouth daily.   Yes [provider]  hydrochlorothiazide (HYDRODIURIL) 25 MG tablet Take 25 mg by mouth daily.    Yes [provider]  HYDROcodone-acetaminophen (NORCO) 7.5-325 MG tablet Take 1 tablet by mouth 2 (two) times daily as needed for severe pain.   Yes [provider]  HYDROcodone-acetaminophen (NORCO) 7.5-325 MG tablet Take 1 tablet by mouth every morning.   Yes [provider]  LORazepam (ATIVAN) 0.5 MG tablet Take 0.5 mg by mouth at bedtime as needed for anxiety. 14 day regimen started on 07/16/2018 ends on 07/30/2018   Yes [provider]  Melatonin 3 MG TABS Take 1 tablet by mouth at bedtime.   Yes [provider]  memantine (NAMENDA) 10 MG tablet Take 10 mg by mouth 2 (two) times daily.    Yes [provider]  potassium chloride (K-DUR) 10 MEQ tablet Take 1 tablet (10 mEq total) by mouth daily. 07/04/18  Yes Mikhail, Maryann, DO  pregabalin (LYRICA) 75 MG capsule Take 75 mg by mouth 3 (three) times daily.    Yes [provider]  tamsulosin (FLOMAX) 0.4 MG CAPS capsule Take 0.8 mg by mouth at bedtime.   Yes [provider]  valsartan (DIOVAN) 160 MG tablet Take 160 mg by mouth daily.    Yes [provider]       Physical Exam: BP (!) 161/86 (BP Location: Right Arm)   Pulse 87   Temp 99.1 F (37.3 C)   Resp (!) 24   Ht 6\' 2"  (1.88 m)   Wt 107.5 kg   SpO2 98%   BMI 30.43 kg/m  General appearance: Well-developed, elderly adult male, somnolent, sluggish, lethargic.    Eyes: Anicteric, conjunctiva pink, lids and lashes normal. PERRL.    ENT: No nasal deformity, discharge, epistaxis.  Hearing unable to assesss.  OP dry, no oral lesions, dentition normal, lips dry.   Neck: No neck masses.  Trachea midline.  No thyromegaly/tenderness. Lymph: No cervical or supraclavicular lymphadenopathy. Skin: Warm and dry.  No jaundice.  No  suspicious rashes or lesions. Cardiac: Tachycardic, regular, nl S1-S2, no murmurs appreciated.  Capillary refill is brisk.  JVP not visible.  No LE edema.  Radial pulses 2+ and symmetric. Respiratory: Tachypneic, no rales or wheezes. Abdomen: Abdomen soft.  No grimace to palpation, no guarding, no rigidity, no rebound.  No ascites, distension, hepatosplenomegaly.   MSK: No deformities or effusions of the large joints of the upper or lower extremities bilaterally.  No cyanosis or clubbing. Neuro: Pupils reactive, equal, but patient does not follow commands.  Does not make eye contact.  Psych: unAble to assess.      Labs on Admission:  I have personally reviewed following labs and imaging studies: CBC: Recent Labs  Lab 07/22/18 1005  WBC 13.3*  NEUTROABS 12.3*  HGB 13.0  HCT 40.3  MCV 103.6*  PLT 967   Basic Metabolic Panel: Recent Labs  Lab 07/22/18 1005  NA 139  K 4.0  CL 100  CO2 24  GLUCOSE 190*  BUN 46*  CREATININE 0.94  CALCIUM 9.0   GFR: Estimated Creatinine Clearance: 83.2 mL/min (by C-G formula based on SCr of 0.94 mg/dL).  Liver Function Tests: Recent Labs  Lab 07/22/18 1005  AST 63*  ALT 63*  ALKPHOS 107  BILITOT 1.4*  PROT 7.3  ALBUMIN 2.7*   No results for input(s): LIPASE, AMYLASE in the last 168 hours. No results for input(s): AMMONIA in the last 168 hours. Coagulation Profile: Recent Labs  Lab 07/22/18 1005  INR 1.19   Sepsis Labs: Lactic acid 1.7  Recent Results (from the past 240 hour(s))  Culture, blood (Routine x 2)     Status: None (Preliminary result)   Collection Time: 07/22/18 10:05 AM  Result Value Ref Range Status   Specimen Description   Final    BLOOD BLOOD RIGHT FOREARM Performed at [facility] [address], [place], [place] [address]    Special Requests   Final    BOTTLES DRAWN AEROBIC AND ANAEROBIC Blood Culture adequate volume Performed at [facility] [address]  [place], [place] [address]    Culture   Final    NO GROWTH < 12 HOURS Performed at [facility] [address] [place], [place] [address]    Report Status PENDING  Incomplete  Culture, blood (Routine x 2)     Status: None (Preliminary result)   Collection Time: 07/22/18 11:45 AM  Result Value Ref Range Status   Specimen Description   Final    BLOOD LEFT ANTECUBITAL Performed at [facility] [address] [place], [place] [address]    Special Requests   Final    BOTTLES DRAWN AEROBIC AND ANAEROBIC Blood Culture adequate volume Performed at [facility] [address] [place], [place] [address]    Culture   Final    NO GROWTH < 12 HOURS Performed at [facility] [address] [place], [place] [address]    Report Status PENDING  Incomplete           Radiological Exams on Admission: Personally reviewed CT head report. CXR personally reviewed, no opacities: Ct Head Wo Contrast  Result Date: 07/22/2018 CLINICAL DATA:  Altered level of consciousness, sepsis EXAM: CT HEAD WITHOUT CONTRAST TECHNIQUE: Contiguous axial images were obtained from the base of the skull through the vertex without intravenous contrast. COMPARISON:  07/01/2018 FINDINGS: Brain: Similar atrophy pattern and chronic white matter microvascular ischemic changes throughout both cerebral hemispheres. No acute intracranial hemorrhage, mass lesion, new infarction, midline shift, herniation, hydrocephalus, or extra-axial fluid collection. No focal mass effect or edema. Cisterns are patent. Minor cerebellar atrophy as well. Vascular: No hyperdense vessel or unexpected calcification. Skull: Normal. Negative for fracture or focal lesion. Sinuses/Orbits: No acute finding. Other: None. IMPRESSION: Stable atrophy and chronic white matter microvascular ischemic changes. No acute intracranial abnormality by noncontrast CT. Electronically Signed   By: Jerilynn Mages.  Shick M.D.   On: 07/22/2018  13:47   Dg Chest Port 1 View  Result Date: 07/22/2018 CLINICAL DATA:  Per EMS-states recurrent UTI and has been on 3 different antibiotics and not getting better, possible sepsis. EXAM: PORTABLE CHEST - 1 VIEW COMPARISON:  07/01/2018 and previous FINDINGS: Relatively low lung volumes. Mild atelectasis at the lung bases. Heart size upper limits normal for technique. Aortic Atherosclerosis (ICD10-170.0). Blunting of right lateral costophrenic angle, cannot exclude small effusion. Bilateral shoulder DJD. IMPRESSION: 1. Low lung volumes with bibasilar atelectasis. 2. Possible small right effusion. Electronically Signed   By: Lucrezia Europe M.D.   On: 07/22/2018 10:57   Ct Renal Stone Study  Result Date: 07/22/2018 CLINICAL DATA:  Flank pain.  Urinary tract infection. EXAM: CT ABDOMEN AND PELVIS WITHOUT CONTRAST TECHNIQUE: Multidetector CT imaging of the abdomen and pelvis was performed following the standard protocol without IV contrast. COMPARISON:  06/29/2018 FINDINGS: Lower chest: Small bilateral pleural effusions with dependent atelectasis. Hepatobiliary: Diffuse hepatic steatosis. Lobulated cyst in the lateral segment of the left lobe of the liver is stable. Gallbladder is within normal limits. Pancreas: Unremarkable Spleen: Unremarkable Adrenals/Urinary Tract: There is no hydronephrosis. No urinary calculus. There are simple cysts in both kidneys. These are not significantly changed. Bladder is grossly within normal limits. Stomach/Bowel: Sigmoid diverticulosis. No obvious mass in the colon. No evidence of small-bowel obstruction. Small hiatal hernia is noted. Vascular/Lymphatic: Atherosclerotic vascular calcifications are seen. No abnormal retroperitoneal adenopathy. Reproductive: Normal prostate. Other: No free fluid. Musculoskeletal: No vertebral compression deformity. Postoperative changes from L5 decompression. IMPRESSION: No evidence of urinary calculus or urinary obstruction. Small pleural effusions and  dependent atelectasis. Chronic changes are noted. Electronically Signed   By: Marybelle Killings M.D.   On: 07/22/2018 16:10           Assessment/Plan  Sepsis from UTI  Suspected source UTI. Organism unknown.   Patient meets criteria given tachycardia, tachypnea, fever, leukocytosis, and evidence of organ dysfunction (encephalopathy, transaminitis).  -Ceftriaxone IV ordered  -Follow blood and urine cultures -Obtain CT renal protocol given recurrent UTI (has hx of BPH) -Continue Flomax and Proscar -Hold bethanechol  Transaminitis Abdomen exam benign, but unclear cause, likely just septic injury. -Check US abdomen -Trend LFTs -Obtain hepatitis serologies   Recurrent UTI Paitent follows with Dr. Gaynelle Arabian.  If CT shows stone, will consult Urology.  Acute encephalopathy This encephalopathy appears global, I suspect a metabolic encephaloathy from UTI. Maybe simply lorazepam and Norco.\  He has no focal findings on exam, and wife, when pressed, describes global weakness, consistent with this.  During previous hospitalization, wife was convinced he could not walk from a focal waekness, but MRI lumbar spine was negative for cord compression or lesions, only canal stenosis and repeat CT head has been unremarkable. -IV fluids -Treat infection -Hold benzos and Norco -If no improvement, will consult Neurology  Dementia  -Continue Namenda and donepezil -Continue SSRI  Hypertension -Continue HCTZ and Valsartan  Other medications  -Continue Lyrica -Hold lorazepam -Lower Norco dose given enceph      DVT prophylaxis: Lovenox  Code Status: FULL  Family Communication: Wife  Disposition Plan: Anticipate IV fluids, IV antibiotics, CT renal.   Consults called: None Admission status: INAPTIENT   At the time of admission, it appears that the appropriate admission status for this patient is INPATIENT. This is judged to be reasonable and necessary in order to provide the required  intensity of service to ensure the patient's safety given: -presenting symptoms of lethargy, weakness, fever -physical exam findings of fever, tachycardia, tachypnea, lethargy, and  -initial radiographic and laboratory data leukocytosis, transaminitis, elevated BUN to creatinine ratio and hematuria -in the context of their chronic comorbidities dementia and HTN    Together, these circumstances are felt to place  him at high risk for further clinical deterioration threatening life, limb, or organ requiring a high intensity of service due to this acute illness that poses a threat to life, limb or bodily function.  I certify that at the point of admission it is my clinical judgment that the patient will require inpatient hospital care spanning beyond 2 midnights from the point of admission and that early discharge would result in unnecessary risk of decompensation and readmission or threat to life, limb or bodily function.   Medical decision making: Patient seen at 2:06 PM on 07/22/2018.  The patient was discussed with Dr. Lacinda Axon.  What exists of the patient's chart was reviewed in depth and summarized above.  Clinical condition: mentation poor, but hemodynamics improved with fluids.        [facility] Pager: please page via [email or web address], enter password "[email or web address]", and identify attending under [facility]

## 2018-07-22 NOTE — Progress Notes (Signed)
MEWS Guidelines - (patients age 80 and over)  Red - At High Risk for Deterioration Yellow - At risk for Deterioration  1. Go to room and assess patient 2. Validate data. Is this patient's baseline? If data confirmed: 3. Is this an acute change? 4. Administer prn meds/treatments as ordered. 5. Note Sepsis score 6. Review goals of care 7. Notify RRT nurse, Camera operator and Provider. 8. Ask Provider to come to bedside.  9. Document patient condition/interventions/response. 10. Increase frequency of vital signs and focused assessments to at least q15 minutes x 4, then q30 minutes x2. - If stable, then q1h x3, then q4h x3 and then q8h or dept. routine. - If unstable, contact Provider, RRT nurse and prepare for possible transfer. 11. Add entry in progress notes using the smart phrase ".MEWPilot". 1. Go to room and assess patient 2. Validate data. Is this patient's baseline? If data confirmed: 3. Is this an acute change? 4. Administer prn meds/treatments as ordered? 5. Note Sepsis score 6. Review goals of care 7. Call RRT nurse for initial change and as needed. 8. Sports coach and Provider 9. Document patient condition/interventions/response. 10. Increase frequency of vital signs and focused assessments to at least q2h x2. - If stable, then q4h x4 and then q8h or dept. routine. - If unstable, contact Provider and RRT nurse and prepare for possible transfer. 11. Add entry in progress notes using the smart phrase ".MEWPilot".  Green - Likely stable Lavender - Comfort Care Only  1. Continue routine/ordered monitoring.  2. Review goals of care. 1. Continue routine/ordered monitoring. 2. Review goals of care.     Yellow protocol initiated.

## 2018-07-23 ENCOUNTER — Inpatient Hospital Stay (HOSPITAL_COMMUNITY): Payer: Medicare Other

## 2018-07-23 LAB — BLOOD CULTURE ID PANEL (REFLEXED)

## 2018-07-23 LAB — BASIC METABOLIC PANEL
Anion gap: 11 (ref 5–15)
BUN: 43 mg/dL — ABNORMAL HIGH (ref 8–23)
CO2: 23 mmol/L (ref 22–32)
Calcium: 8.3 mg/dL — ABNORMAL LOW (ref 8.9–10.3)
Chloride: 110 mmol/L (ref 98–111)
Creatinine, Ser: 0.81 mg/dL (ref 0.61–1.24)
GFR calc Af Amer: >60 mL/min (ref >60)
GFR calc non Af Amer: >60 mL/min (ref >60)
Glucose, Bld: 194 mg/dL — ABNORMAL HIGH (ref 70–99)
Potassium: 3.6 mmol/L (ref 3.5–5.1)
Sodium: 144 mmol/L (ref 135–145)

## 2018-07-23 LAB — HEPATIC FUNCTION PANEL
ALT: 64 U/L — ABNORMAL HIGH (ref 0–44)
AST: 71 U/L — ABNORMAL HIGH (ref 15–41)
Albumin: 2.1 g/dL — ABNORMAL LOW (ref 3.5–5.0)
Alkaline Phosphatase: 83 U/L (ref 38–126)
BILIRUBIN DIRECT: 0.3 mg/dL — AB (ref 0.0–0.2)
Indirect Bilirubin: 0.2 mg/dL — ABNORMAL LOW (ref 0.3–0.9)
Total Bilirubin: 0.5 mg/dL (ref 0.3–1.2)
Total Protein: 6.3 g/dL — ABNORMAL LOW (ref 6.5–8.1)

## 2018-07-23 LAB — CBC
HCT: 36.3 % — ABNORMAL LOW (ref 39.0–52.0)
Hemoglobin: 11.4 g/dL — ABNORMAL LOW (ref 13.0–17.0)
MCH: 32.9 pg (ref 26.0–34.0)
MCHC: 31.4 g/dL (ref 30.0–36.0)
MCV: 104.6 fL — ABNORMAL HIGH (ref 80.0–100.0)
Platelets: 180 10*3/uL (ref 150–400)
RBC: 3.47 MIL/uL — ABNORMAL LOW (ref 4.22–5.81)
RDW: 13.2 % (ref 11.5–15.5)
WBC: 11.8 10*3/uL — ABNORMAL HIGH (ref 4.0–10.5)
nRBC: 0 % (ref 0.0–0.2)

## 2018-07-23 LAB — MRSA PCR SCREENING: MRSA by PCR: POSITIVE — AB

## 2018-07-23 MED ORDER — MUPIROCIN 2 % EX OINT
1.0000 "application " | TOPICAL_OINTMENT | Freq: Two times a day (BID) | CUTANEOUS | Status: AC
  Administered 2018-07-23 – 2018-07-27 (×10): 1 via NASAL
  Filled 2018-07-23 (×2): qty 22

## 2018-07-23 MED ORDER — LIP MEDEX EX OINT
TOPICAL_OINTMENT | CUTANEOUS | Status: AC
  Administered 2018-07-23: 18:00:00
  Filled 2018-07-23: qty 7

## 2018-07-23 MED ORDER — CHLORHEXIDINE GLUCONATE CLOTH 2 % EX PADS
6.0000 | MEDICATED_PAD | Freq: Every day | CUTANEOUS | Status: AC
  Administered 2018-07-24: 6 via TOPICAL

## 2018-07-23 MED ORDER — SODIUM CHLORIDE 0.9 % IV SOLN
2.0000 g | Freq: Every day | INTRAVENOUS | Status: DC
  Administered 2018-07-23 – 2018-07-24 (×2): 2 g via INTRAVENOUS
  Filled 2018-07-23 (×2): qty 2

## 2018-07-23 NOTE — Progress Notes (Signed)
PROGRESS NOTE    Ricky Black  MOQ:[id number] DOB: May 09, 1941 DOA: 07/22/2018 PCP: Marton Redwood, MD   Brief Narrative: 78 y.o. M with hx dementia, previously home dwelling, HTN, and 2 recent admissions for MSSA UTI who presents with slow progressive weakness.  Caveat that patient is unable to provide any history, which is collected from wife at bedside instead.    The patient was discharged mid-December for UTI, MSSA, discharged on nitrofurantoin.  He was seeming to do better for a time, ambulating short distances, but then in the last 1-2 weeks has started to decline again, no longer getting out of bed, weaker and weaker, less responsive until today he had a fever and was sent to the ER.  There has been no cough or respiratory distress.  There is been no obvious urinary symptoms.  No vomiting or reported abdominal pain.  ED course: - Temp 101F, heart rate 91, respirations 31, blood pressure 130/79 -Na 139, K 4.0, Cr 0.9, WBC 13.3K, Hgb 13 -Transaminases 60s, Tbili 1.4 -INR 1.1 -Lactic acid 1.7 -UA showed many RBCs and WBCs -CXR showed no pneumonia or effusion -CT head unremarkable -He was given ceftriaxone and IV fluids in the hospitalist service were asked to evaluate for admission   Assessment & Plan:   Principal Problem:   Sepsis ([place]) Active Problems:   Essential hypertension   Dementia ([place])   Acute encephalopathy   Acute lower UTI   Transaminitis  Sepsis from UTI -blood culture positive for MSSA.  Patient meets criteria given tachycardia, tachypnea, fever, leukocytosis, and evidence of organ dysfunction (encephalopathy, transaminitis).  -Ceftriaxone IV  -Follow blood and urine cultures - CT renal protocol-no renal stones -Continue Flomax and Proscar -Hold bethanechol -Bladder scan every shift to check if he is retaining urine if he is that could be the cause of his recurrent UTI.  He has not seen urologist in a long time.  His current urologist has been  retired for a long time.  Transaminitis Abdomen exam benign, but unclear cause, likely just septic injury. -Check US abdomen-no stones, hepatic steatosis present. -Trend LFTs  Acute metabolic encephalopathy secondary to sepsis cautious with Norco.  Lorazepam stopped.  CT head unremarkable.  Monitor closely.  Dementia  -Continue Namenda and donepezil -Continue SSRI  Hypertension hold HCTZ continue valsartan.  Patient is getting slow IV hydration.  Estimated body mass index is 30.43 kg/m as calculated from the following:   Height as of this encounter: 6\' 2"  (1.88 m).   Weight as of this encounter: 107.5 kg.  DVT prophylaxis: lovenox Code Status: full Family Communication:dw daughter  Disposition Plan-unknown  Consultants:  none  Procedures:none Antimicrobials: Rocephin  Subjective: Patient resting in bed when I asked him how you are doing he said wonderful starting to wake up staff reported he ate breakfast and took his pills in applesauce pills were crushed.   Objective: Vitals:   07/22/18 2026 07/23/18 0017 07/23/18 0418 07/23/18 0845  BP: 126/69 112/64 124/71 (!) 163/84  Pulse: 83 81 81 84  Resp: (!) 24 (!) 24 (!) 22 20  Temp: (!) 100.6 F (38.1 C) 99.3 F (37.4 C) (!) 100.9 F (38.3 C) 98.9 F (37.2 C)  TempSrc: Axillary   Axillary  SpO2: 99% 97% 96% 96%  Weight:      Height:        Intake/Output Summary (Last 24 hours) at 07/23/2018 1020 Last data filed at 07/22/2018 1600 Gross per 24 hour  Intake 1140 ml  Output 1300  ml  Net -160 ml   Filed Weights   07/22/18 1232  Weight: 107.5 kg    Examination:  General exam: Appears calm and comfortable  Respiratory system: Clear to auscultation. Respiratory effort normal. Cardiovascular system: S1 & S2 heard, RRR. No JVD, murmurs, rubs, gallops or clicks. No pedal edema. Gastrointestinal system: Abdomen is nondistended, soft and nontender. No organomegaly or masses felt. Normal bowel sounds heard. Central  nervous system: Awake Extremities: Symmetric 5 x 5 power. Skin: No rashes, lesions or ulcers Psychiatry: Judgement and insight appear normal. Mood & affect appropriate.     Data Reviewed: I have personally reviewed following labs and imaging studies  CBC: Recent Labs  Lab 07/22/18 1005 07/23/18 0658  WBC 13.3* 11.8*  NEUTROABS 12.3*  --   HGB 13.0 11.4*  HCT 40.3 36.3*  MCV 103.6* 104.6*  PLT 201 379   Basic Metabolic Panel: Recent Labs  Lab 07/22/18 1005 07/23/18 0658  NA 139 144  K 4.0 3.6  CL 100 110  CO2 24 23  GLUCOSE 190* 194*  BUN 46* 43*  CREATININE 0.94 0.81  CALCIUM 9.0 8.3*   GFR: Estimated Creatinine Clearance: 96.5 mL/min (by C-G formula based on SCr of 0.81 mg/dL). Liver Function Tests: Recent Labs  Lab 07/22/18 1005 07/23/18 0658  AST 63* 71*  ALT 63* 64*  ALKPHOS 107 83  BILITOT 1.4* 0.5  PROT 7.3 6.3*  ALBUMIN 2.7* 2.1*   No results for input(s): LIPASE, AMYLASE in the last 168 hours. No results for input(s): AMMONIA in the last 168 hours. Coagulation Profile: Recent Labs  Lab 07/22/18 1005  INR 1.19   Cardiac Enzymes: No results for input(s): CKTOTAL, CKMB, CKMBINDEX, TROPONINI in the last 168 hours. BNP (last 3 results) No results for input(s): PROBNP in the last 8760 hours. HbA1C: No results for input(s): HGBA1C in the last 72 hours. CBG: No results for input(s): GLUCAP in the last 168 hours. Lipid Profile: No results for input(s): CHOL, HDL, LDLCALC, TRIG, CHOLHDL, LDLDIRECT in the last 72 hours. Thyroid Function Tests: No results for input(s): TSH, T4TOTAL, FREET4, T3FREE, THYROIDAB in the last 72 hours. Anemia Panel: No results for input(s): VITAMINB12, FOLATE, FERRITIN, TIBC, IRON, RETICCTPCT in the last 72 hours. Sepsis Labs: Recent Labs  Lab 07/22/18 1012 07/22/18 1155  LATICACIDVEN 1.72 1.35    Recent Results (from the past 240 hour(s))  Culture, blood (Routine x 2)     Status: None (Preliminary result)    Collection Time: 07/22/18 10:05 AM  Result Value Ref Range Status   Specimen Description BLOOD BLOOD RIGHT FOREARM  Final   Special Requests   Final    BOTTLES DRAWN AEROBIC AND ANAEROBIC Blood Culture adequate volume Performed at [facility] [address] [place], [place] [address]    Culture  Setup Time   Final    IN BOTH AEROBIC AND ANAEROBIC BOTTLES GRAM POSITIVE COCCI CRITICAL RESULT CALLED TO, READ BACK BY AND VERIFIED WITHLavell Luster PHARMD [id number] 07/23/18 A BROWNING    Culture GRAM POSITIVE COCCI  Final   Report Status PENDING  Incomplete  Blood Culture ID Panel (Reflexed)     Status: Abnormal   Collection Time: 07/22/18 10:05 AM  Result Value Ref Range Status   Enterococcus species NOT DETECTED NOT DETECTED Final   Listeria monocytogenes NOT DETECTED NOT DETECTED Final   Staphylococcus species DETECTED (A) NOT DETECTED Final    Comment: CRITICAL RESULT CALLED TO, READ BACK BY AND VERIFIED WITH: J  GRIMSLEY PHARMD [id number] 07/23/18 A BROWNING    Staphylococcus aureus (BCID) DETECTED (A) NOT DETECTED Final    Comment: Methicillin (oxacillin) susceptible Staphylococcus aureus (MSSA). Preferred therapy is anti staphylococcal beta lactam antibiotic (Cefazolin or Nafcillin), unless clinically contraindicated. CRITICAL RESULT CALLED TO, READ BACK BY AND VERIFIED WITHLavell Luster PHARMD [id number] 07/23/18 A BROWNING    Methicillin resistance NOT DETECTED NOT DETECTED Final   Streptococcus species NOT DETECTED NOT DETECTED Final   Streptococcus agalactiae NOT DETECTED NOT DETECTED Final   Streptococcus pneumoniae NOT DETECTED NOT DETECTED Final   Streptococcus pyogenes NOT DETECTED NOT DETECTED Final   Acinetobacter baumannii NOT DETECTED NOT DETECTED Final   Enterobacteriaceae species NOT DETECTED NOT DETECTED Final   Enterobacter cloacae complex NOT DETECTED NOT DETECTED Final   Escherichia coli NOT DETECTED NOT DETECTED Final   Klebsiella oxytoca NOT DETECTED NOT DETECTED  Final   Klebsiella pneumoniae NOT DETECTED NOT DETECTED Final   Proteus species NOT DETECTED NOT DETECTED Final   Serratia marcescens NOT DETECTED NOT DETECTED Final   Haemophilus influenzae NOT DETECTED NOT DETECTED Final   Neisseria meningitidis NOT DETECTED NOT DETECTED Final   Pseudomonas aeruginosa NOT DETECTED NOT DETECTED Final   Candida albicans NOT DETECTED NOT DETECTED Final   Candida glabrata NOT DETECTED NOT DETECTED Final   Candida krusei NOT DETECTED NOT DETECTED Final   Candida parapsilosis NOT DETECTED NOT DETECTED Final   Candida tropicalis NOT DETECTED NOT DETECTED Final    Comment: Performed at [facility], [place] [address], [place], [place] [address]  Culture, blood (Routine x 2)     Status: None (Preliminary result)   Collection Time: 07/22/18 11:45 AM  Result Value Ref Range Status   Specimen Description   Final    BLOOD LEFT ANTECUBITAL Performed at [facility] [address], [place], [place] [address]    Special Requests   Final    BOTTLES DRAWN AEROBIC AND ANAEROBIC Blood Culture adequate volume Performed at [facility] [address], [place], [place] [address]    Culture  Setup Time   Final    GRAM POSITIVE COCCI IN BOTH AEROBIC AND ANAEROBIC BOTTLES CRITICAL VALUE NOTED.  VALUE IS CONSISTENT WITH PREVIOUSLY REPORTED AND CALLED VALUE. Performed at [facility], [place] [address], [place], [place] [address]    Culture NO GROWTH < 24 HOURS  Final   Report Status PENDING  Incomplete  MRSA PCR Screening     Status: Abnormal   Collection Time: 07/23/18  2:03 AM  Result Value Ref Range Status   MRSA by PCR POSITIVE (A) NEGATIVE Final    Comment:        The GeneXpert MRSA Assay (FDA approved for NASAL specimens only), is one component of a comprehensive MRSA colonization surveillance program. It is not intended to diagnose MRSA infection nor to guide or monitor treatment for MRSA infections. RESULT  CALLED TO, READ BACK BY AND VERIFIED WITH: HILL,D RN @0353  ON 07/23/18 JACKSON,K Performed at [facility], [place] [address] [place],  [address]          Radiology Studies: Ct Head Wo Contrast  Result Date: 07/22/2018 CLINICAL DATA:  Altered level of consciousness, sepsis EXAM: CT HEAD WITHOUT CONTRAST TECHNIQUE: Contiguous axial images were obtained from the base of the skull through the vertex without intravenous contrast. COMPARISON:  07/01/2018 FINDINGS: Brain: Similar atrophy pattern and chronic white matter microvascular ischemic changes throughout both cerebral hemispheres. No acute  intracranial hemorrhage, mass lesion, new infarction, midline shift, herniation, hydrocephalus, or extra-axial fluid collection. No focal mass effect or edema. Cisterns are patent. Minor cerebellar atrophy as well. Vascular: No hyperdense vessel or unexpected calcification. Skull: Normal. Negative for fracture or focal lesion. Sinuses/Orbits: No acute finding. Other: None. IMPRESSION: Stable atrophy and chronic white matter microvascular ischemic changes. No acute intracranial abnormality by noncontrast CT. Electronically Signed   By: Jerilynn Mages.  Shick M.D.   On: 07/22/2018 13:47   US Abdomen Complete  Result Date: 07/23/2018 CLINICAL DATA:  Elevated liver function tests. EXAM: ABDOMEN ULTRASOUND COMPLETE COMPARISON:  CT of the abdomen without contrast on 07/22/2018 FINDINGS: Gallbladder: No gallstones or wall thickening visualized. No sonographic Murphy sign noted by sonographer. Common bile duct: Diameter: 5 mm. Liver: The liver demonstrates coarse echotexture and increased echogenicity, likely reflecting diffuse steatosis. No overt cirrhotic contour abnormalities or focal lesions are identified. There is no evidence of intrahepatic biliary ductal dilatation. Left lobe hepatic cyst shows mild internal septation and measures approximately 2.6 cm in greatest diameter. No solid masses appreciated by  ultrasound. Portal vein is patent on color Doppler imaging with normal direction of blood flow towards the liver. IVC: No abnormality visualized. Pancreas: Visualized portion unremarkable. Spleen: Size and appearance within normal limits. Right Kidney: Length: 12.6 cm. Echogenicity within normal limits. No solid mass or hydronephrosis visualized. 3 cm cyst of the upper pole has a benign appearance by ultrasound. Left Kidney: Length: 12.9 cm. Echogenicity within normal limits. No mass or hydronephrosis visualized. Abdominal aorta: No aneurysm visualized. Other findings: None. IMPRESSION: Increased echogenicity of the liver parenchyma consistent with diffuse steatosis. No evidence of biliary obstruction. Simple cyst in the left lobe of the liver. Electronically Signed   By: Aletta Edouard M.D.   On: 07/23/2018 08:28   Dg Chest Port 1 View  Result Date: 07/22/2018 CLINICAL DATA:  Per EMS-states recurrent UTI and has been on 3 different antibiotics and not getting better, possible sepsis. EXAM: PORTABLE CHEST - 1 VIEW COMPARISON:  07/01/2018 and previous FINDINGS: Relatively low lung volumes. Mild atelectasis at the lung bases. Heart size upper limits normal for technique. Aortic Atherosclerosis (ICD10-170.0). Blunting of right lateral costophrenic angle, cannot exclude small effusion. Bilateral shoulder DJD. IMPRESSION: 1. Low lung volumes with bibasilar atelectasis. 2. Possible small right effusion. Electronically Signed   By: Lucrezia Europe M.D.   On: 07/22/2018 10:57   Ct Renal Stone Study  Result Date: 07/22/2018 CLINICAL DATA:  Flank pain.  Urinary tract infection. EXAM: CT ABDOMEN AND PELVIS WITHOUT CONTRAST TECHNIQUE: Multidetector CT imaging of the abdomen and pelvis was performed following the standard protocol without IV contrast. COMPARISON:  06/29/2018 FINDINGS: Lower chest: Small bilateral pleural effusions with dependent atelectasis. Hepatobiliary: Diffuse hepatic steatosis. Lobulated cyst in the  lateral segment of the left lobe of the liver is stable. Gallbladder is within normal limits. Pancreas: Unremarkable Spleen: Unremarkable Adrenals/Urinary Tract: There is no hydronephrosis. No urinary calculus. There are simple cysts in both kidneys. These are not significantly changed. Bladder is grossly within normal limits. Stomach/Bowel: Sigmoid diverticulosis. No obvious mass in the colon. No evidence of small-bowel obstruction. Small hiatal hernia is noted. Vascular/Lymphatic: Atherosclerotic vascular calcifications are seen. No abnormal retroperitoneal adenopathy. Reproductive: Normal prostate. Other: No free fluid. Musculoskeletal: No vertebral compression deformity. Postoperative changes from L5 decompression. IMPRESSION: No evidence of urinary calculus or urinary obstruction. Small pleural effusions and dependent atelectasis. Chronic changes are noted. Electronically Signed   By: Marybelle Killings M.D.   On:  07/22/2018 16:10        Scheduled Meds: . Chlorhexidine Gluconate Cloth  6 each Topical Q0600  . donepezil  10 mg Oral QHS  . enoxaparin (LOVENOX) injection  40 mg Subcutaneous Q24H  . escitalopram  20 mg Oral Daily  . finasteride  5 mg Oral Daily  . hydrochlorothiazide  25 mg Oral Daily  . irbesartan  150 mg Oral Daily  . memantine  10 mg Oral BID  . mupirocin ointment  1 application Nasal BID  . pregabalin  75 mg Oral TID  . tamsulosin  0.8 mg Oral QHS   Continuous Infusions: . sodium chloride 125 mL/hr at 07/23/18 0202  . cefTRIAXone (ROCEPHIN)  IV 2 g (07/23/18 0631)     LOS: 1 day     Georgette Shell, MD [facility] If 7PM-7AM, please contact night-coverage [email or web address] Password [email or web address] 07/23/2018, 10:20 AM

## 2018-07-23 NOTE — Progress Notes (Signed)
PHARMACY - PHYSICIAN COMMUNICATION CRITICAL VALUE ALERT - BLOOD CULTURE IDENTIFICATION (BCID)  Ricky Black is an 78 y.o. male who presented to [facility] on 07/22/2018 with a chief complaint of UTI.  Assessment:  Patient with known bactermeia (include suspected source if known)  Name of physician (or Provider) Contacted: X. Blount Current antibiotics: Ceftriaxone 1gm iv q24h  Changes to prescribed antibiotics recommended:  Recommendations accepted by provider  Change to 2gm iv q24hr ceftriaxone   Results for orders placed or performed during the hospital encounter of 07/22/18  Blood Culture ID Panel (Reflexed) (Collected: 07/22/2018 10:05 AM)  Result Value Ref Range   Enterococcus species NOT DETECTED NOT DETECTED   Listeria monocytogenes NOT DETECTED NOT DETECTED   Staphylococcus species DETECTED (A) NOT DETECTED   Staphylococcus aureus (BCID) DETECTED (A) NOT DETECTED   Methicillin resistance NOT DETECTED NOT DETECTED   Streptococcus species NOT DETECTED NOT DETECTED   Streptococcus agalactiae NOT DETECTED NOT DETECTED   Streptococcus pneumoniae NOT DETECTED NOT DETECTED   Streptococcus pyogenes NOT DETECTED NOT DETECTED   Acinetobacter baumannii NOT DETECTED NOT DETECTED   Enterobacteriaceae species NOT DETECTED NOT DETECTED   Enterobacter cloacae complex NOT DETECTED NOT DETECTED   Escherichia coli NOT DETECTED NOT DETECTED   Klebsiella oxytoca NOT DETECTED NOT DETECTED   Klebsiella pneumoniae NOT DETECTED NOT DETECTED   Proteus species NOT DETECTED NOT DETECTED   Serratia marcescens NOT DETECTED NOT DETECTED   Haemophilus influenzae NOT DETECTED NOT DETECTED   Neisseria meningitidis NOT DETECTED NOT DETECTED   Pseudomonas aeruginosa NOT DETECTED NOT DETECTED   Candida albicans NOT DETECTED NOT DETECTED   Candida glabrata NOT DETECTED NOT DETECTED   Candida krusei NOT DETECTED NOT DETECTED   Candida parapsilosis NOT DETECTED NOT DETECTED   Candida tropicalis NOT DETECTED  NOT DETECTED    Nani Skillern Crowford 07/23/2018  5:20 AM

## 2018-07-23 NOTE — Progress Notes (Signed)
Pt positive MRSA PCR; standing orders initiated.

## 2018-07-23 NOTE — Progress Notes (Signed)
Pt responds to voice, answers questions with short "yes" responses, and eyes will open briefly before dozing back to sleep. Pt unable to remain alert enough to take scheduled medications. Will hold evening medications.

## 2018-07-23 NOTE — Progress Notes (Signed)
Pt respiration elevated at 22 bpm and LOC, pt responds to voice. This is not an acute change for the pt. Prior vital signs charted show pts respirations have been elevated to 22 bpm. RN will continue to monitor pt for acute changes.

## 2018-07-23 NOTE — Progress Notes (Signed)
MD, please note that per family pt had a Transurethral needle ablation in 2001.

## 2018-07-23 NOTE — Evaluation (Signed)
Physical Therapy Evaluation Patient Details Name: Ricky Black MRN: [id number] DOB: 1940/10/25 Today's Date: 07/23/2018   History of Present Illness  Ricky Black is a 78 y.o. M with hx dementia, previously home dwelling, HTN, and 2 recent admissions for MSSA UTI who presents with slow progressive weakness.  In December hospitalized for the same and went to SNF.    Clinical Impression  Patient presents with significant limitations in mobility this session due to decreased arousal, decreased participation in mobility tasks requiring max to total A for EOB activity.  Feel he will need skilled PT in the acute setting to progress mobility as tolerated and to maximize mobility prior to d/c to SNF level rehab.     Follow Up Recommendations SNF;Supervision/Assistance - 24 hour    Equipment Recommendations  None recommended by PT    Recommendations for Other Services       Precautions / Restrictions Precautions Precautions: Fall      Mobility  Bed Mobility Overal bed mobility: Needs Assistance Bed Mobility: Supine to Sit;Sit to Supine     Supine to sit: Total assist;HOB elevated Sit to supine: Total assist;+2 for physical assistance   General bed mobility comments: assisted legs off bed and lifting trunk to sitting, pt not assisting and grimacing due to back pain; to supine assist for legs and trunk and daughter assist to scoot up in bed with PT  Transfers                 General transfer comment: NT  Ambulation/Gait                Stairs            Wheelchair Mobility    Modified Rankin (Stroke Patients Only)       Balance Overall balance assessment: Needs assistance Sitting-balance support: Feet supported Sitting balance-Leahy Scale: Poor Sitting balance - Comments: no attempts to correct balance, could sit momentarily without physical assist, but leaning heavily forward or back depending on position, not fully alert       Standing balance  comment: NT                             Pertinent Vitals/Pain Pain Assessment: Faces Faces Pain Scale: Hurts even more Pain Location: back with mobility Pain Descriptors / Indicators: Aching;Sore;Discomfort;Guarding;Grimacing Pain Intervention(s): Monitored during session;Limited activity within patient's tolerance;Repositioned    Home Living Family/patient expects to be discharged to:: Skilled nursing facility Living Arrangements: Spouse/significant other Available Help at Discharge: Family Type of Home: House Home Access: Stairs to enter Entrance Stairs-Rails: Chemical engineer of Steps: 5 Home Layout: Multi-level;Full bath on main level Home Equipment: Walker - 2 wheels;Cane - single point;Shower seat      Prior Function Level of Independence: Needs assistance   Gait / Transfers Assistance Needed: normally ambulatory in the home no devices, but has had recent falls and progressive weakness.  WAs up to walking with walker in SNF per daughter, but weaker recently           Hand Dominance        Extremity/Trunk Assessment   Upper Extremity Assessment Upper Extremity Assessment: Difficult to assess due to impaired cognition(not moving to command)    Lower Extremity Assessment Lower Extremity Assessment: Difficult to assess due to impaired cognition(not moving to command)    Cervical / Trunk Assessment Cervical / Trunk Assessment: Kyphotic  Communication   Communication: No difficulties  Cognition Arousal/Alertness: Lethargic Behavior During Therapy: Flat affect Overall Cognitive Status: Difficult to assess                                 General Comments: history of dementia, but today not following one step commands, not engaging much with daughter sitting EOB or recognizing her to call her name, (stated Inez Catalina, his wife's name)      General Comments General comments (skin integrity, edema, etc.): sat EOB about 10  minutes trying to engage with daughter and have pt more alert to assist with balance; but no increase in participation, assisted for some cervical rotation due to severe stiffness noted throughout    Exercises Other Exercises Other Exercises: cervical rotation in sitting PROM   Assessment/Plan    PT Assessment Patient needs continued PT services  PT Problem List Decreased strength;Decreased cognition;Decreased activity tolerance;Decreased balance;Decreased mobility;Decreased knowledge of precautions;Decreased safety awareness       PT Treatment Interventions DME instruction;Therapeutic exercise;Gait training;Balance training;Functional mobility training;Therapeutic activities;Patient/family education    PT Goals (Current goals can be found in the Care Plan section)  Acute Rehab PT Goals Patient Stated Goal: to return to rehab and work back up to walking PT Goal Formulation: With family Time For Goal Achievement: 08/06/18 Potential to Achieve Goals: Fair    Frequency Min 2X/week   Barriers to discharge        Co-evaluation               AM-PAC PT "6 Clicks" Mobility  Outcome Measure Help needed turning from your back to your side while in a flat bed without using bedrails?: Total Help needed moving from lying on your back to sitting on the side of a flat bed without using bedrails?: Total Help needed moving to and from a bed to a chair (including a wheelchair)?: Total Help needed standing up from a chair using your arms (e.g., wheelchair or bedside chair)?: Total Help needed to walk in hospital room?: Total Help needed climbing 3-5 steps with a railing? : Total 6 Click Score: 6    End of Session Equipment Utilized During Treatment: Oxygen Activity Tolerance: Patient limited by fatigue;Patient limited by lethargy Patient left: in bed;with call bell/phone within reach;with family/visitor present   PT Visit Diagnosis: Other abnormalities of gait and mobility  (R26.89);Other symptoms and signs involving the nervous system (R29.898)    Time: 5573-2202 PT Time Calculation (min) (ACUTE ONLY): 24 min   Charges:   PT Evaluation $PT Eval Moderate Complexity: 1 Mod PT Treatments $Therapeutic Activity: 8-22 mins        Magda Kiel, PT Acute Rehabilitation Services [phone] 07/23/2018   Reginia Naas 07/23/2018, 1:05 PM

## 2018-07-24 ENCOUNTER — Inpatient Hospital Stay (HOSPITAL_COMMUNITY): Payer: Medicare Other

## 2018-07-24 LAB — URINE CULTURE
Culture: >=100000 — AB
Special Requests: NORMAL

## 2018-07-24 MED ORDER — ACETAMINOPHEN 325 MG PO TABS
650.0000 mg | ORAL_TABLET | Freq: Three times a day (TID) | ORAL | Status: DC
  Administered 2018-07-24 – 2018-07-27 (×5): 650 mg via ORAL
  Filled 2018-07-24 (×8): qty 2

## 2018-07-24 MED ORDER — CEFAZOLIN SODIUM-DEXTROSE 2-4 GM/100ML-% IV SOLN
2.0000 g | Freq: Three times a day (TID) | INTRAVENOUS | Status: DC
  Administered 2018-07-25 – 2018-07-26 (×5): 2 g via INTRAVENOUS
  Filled 2018-07-24 (×6): qty 100

## 2018-07-24 NOTE — Progress Notes (Signed)
PROGRESS NOTE    Ricky Black  MWU:[id number] DOB: [birth year] DOA: 07/22/2018 PCP: Marton Redwood, MD    Brief Narrative: 78 y.o.Mwith hx dementia, previously home dwelling, HTN, and 2 recent admissions for MSSA UTIwho presents with slow progressive weakness.  Caveat that patient is unable to provide any history, which is collected from wife at bedside instead.   The patient was discharged mid-December for UTI, MSSA, discharged on nitrofurantoin. He was seeming to do better for a time, ambulating short distances, but then in the last 1-2 weeks has started to decline again, no longer getting out of bed, weaker and weaker, less responsive until today he had a fever and was sent to the ER. There has been no cough or respiratory distress. There is been no obvious urinary symptoms. No vomiting or reported abdominal pain.  ED course: -Temp 101F, heart rate 91, respirations 31,blood pressure 130/79 -Na139, K4.0, Cr0.9, WBC13.3K, Hgb13 -Transaminases 60s, Tbili 1.4 -INR 1.1 -Lactic acid 1.7 -UA showed many RBCs and WBCs -CXR showed no pneumonia or effusion -CT head unremarkable He was given ceftriaxone and IV fluidsin the hospitalist service were asked to evaluate for admission  Assessment & Plan:   Principal Problem:   Sepsis ([place]) Active Problems:   Essential hypertension   Dementia ([place])   Acute encephalopathy   Acute lower UTI   Transaminitis       Sepsis from UTI-blood culture positive for MSSA.  Patient meets criteria given tachycardia, tachypnea, fever, leukocytosis, and evidence of organ dysfunction(encephalopathy, transaminitis). -Ceftriaxone IV - CT renal protocol-no renal stones -ContinueFlomax and Proscar -Holdbethanechol -Bladder scan showed more than 300 cc of urine Foley catheter placed.  Patient has recurrent UTI  over last 1 month. -DC IV fluids obtain chest x-ray  Transaminitis -Check US abdomen-no stones, hepatic steatosis  present. -Trend LFTs  Acute metabolic encephalopathy secondary to sepsis cautious with Norco.  Lorazepam stopped.  CT head unremarkable.  Monitor closely.  Dementia -ContinueNamenda and donepezil -ContinueSSRI      Hypertension hold HCTZ continue valsartan.  Patient is getting slow IV hydration.    Estimated body mass index is 30.43 kg/m as calculated from the following:   Height as of this encounter: 6\' 2"  (1.88 m).   Weight as of this encounter: 107.5 kg.  DVT prophylaxis: lovenox Code Status:full Family Communication none Disposition Plan: Pending clinical improvement  Consultants:    Procedures: None none Antimicrobials: None Rocephin Subjective: None patient resting in bed when asked how are you he said I am okay overnight nursing notes reviewed patient became tachypneic on IV fluids  Objective: Vitals:   07/23/18 2010 07/23/18 2219 07/24/18 0454 07/24/18 0719  BP: (!) 141/66  (!) 160/73   Pulse: 76  72   Resp: (!) 22 17 20    Temp: 98.7 F (37.1 C)  99.5 F (37.5 C) 98.3 F (36.8 C)  TempSrc:    Oral  SpO2: 97%  99%   Weight:      Height:        Intake/Output Summary (Last 24 hours) at 07/24/2018 1131 Last data filed at 07/24/2018 0407 Gross per 24 hour  Intake 715.24 ml  Output 600 ml  Net 115.24 ml   Filed Weights   07/22/18 1232  Weight: 107.5 kg    Examination:  General exam: Appears calm and comfortable  Respiratory system: Clear to auscultation. Respiratory effort normal. Cardiovascular system: S1 & S2 heard, RRR. No JVD, murmurs, rubs, gallops or clicks. No pedal edema. Gastrointestinal system: Abdomen  is nondistended, soft and nontender. No organomegaly or masses felt. Normal bowel sounds heard. Central nervous system: Alert and oriented. No focal neurological deficits. Extremities: Symmetric 5 x 5 power. Skin: No rashes, lesions or ulcers Psychiatry: Judgement and insight appear normal. Mood & affect appropriate.     Data  Reviewed: I have personally reviewed following labs and imaging studies  CBC: Recent Labs  Lab 07/22/18 1005 07/23/18 0658  WBC 13.3* 11.8*  NEUTROABS 12.3*  --   HGB 13.0 11.4*  HCT 40.3 36.3*  MCV 103.6* 104.6*  PLT 201 409   Basic Metabolic Panel: Recent Labs  Lab 07/22/18 1005 07/23/18 0658  NA 139 144  K 4.0 3.6  CL 100 110  CO2 24 23  GLUCOSE 190* 194*  BUN 46* 43*  CREATININE 0.94 0.81  CALCIUM 9.0 8.3*   GFR: Estimated Creatinine Clearance: 96.5 mL/min (by C-G formula based on SCr of 0.81 mg/dL). Liver Function Tests: Recent Labs  Lab 07/22/18 1005 07/23/18 0658  AST 63* 71*  ALT 63* 64*  ALKPHOS 107 83  BILITOT 1.4* 0.5  PROT 7.3 6.3*  ALBUMIN 2.7* 2.1*   No results for input(s): LIPASE, AMYLASE in the last 168 hours. No results for input(s): AMMONIA in the last 168 hours. Coagulation Profile: Recent Labs  Lab 07/22/18 1005  INR 1.19   Cardiac Enzymes: No results for input(s): CKTOTAL, CKMB, CKMBINDEX, TROPONINI in the last 168 hours. BNP (last 3 results) No results for input(s): PROBNP in the last 8760 hours. HbA1C: No results for input(s): HGBA1C in the last 72 hours. CBG: No results for input(s): GLUCAP in the last 168 hours. Lipid Profile: No results for input(s): CHOL, HDL, LDLCALC, TRIG, CHOLHDL, LDLDIRECT in the last 72 hours. Thyroid Function Tests: No results for input(s): TSH, T4TOTAL, FREET4, T3FREE, THYROIDAB in the last 72 hours. Anemia Panel: No results for input(s): VITAMINB12, FOLATE, FERRITIN, TIBC, IRON, RETICCTPCT in the last 72 hours. Sepsis Labs: Recent Labs  Lab 07/22/18 1012 07/22/18 1155  LATICACIDVEN 1.72 1.35    Recent Results (from the past 240 hour(s))  Culture, blood (Routine x 2)     Status: Abnormal (Preliminary result)   Collection Time: 07/22/18 10:05 AM  Result Value Ref Range Status   Specimen Description BLOOD BLOOD RIGHT FOREARM  Final   Special Requests   Final    BOTTLES DRAWN AEROBIC AND  ANAEROBIC Blood Culture adequate volume Performed at [facility] [address] [place], [place] [address]    Culture  Setup Time   Final    IN BOTH AEROBIC AND ANAEROBIC BOTTLES GRAM POSITIVE COCCI CRITICAL RESULT CALLED TO, READ BACK BY AND VERIFIED WITHLavell Luster PHARMD [id number] 07/23/18 A BROWNING    Culture STAPHYLOCOCCUS AUREUS (A)  Final   Report Status PENDING  Incomplete  Blood Culture ID Panel (Reflexed)     Status: Abnormal   Collection Time: 07/22/18 10:05 AM  Result Value Ref Range Status   Enterococcus species NOT DETECTED NOT DETECTED Final   Listeria monocytogenes NOT DETECTED NOT DETECTED Final   Staphylococcus species DETECTED (A) NOT DETECTED Final    Comment: CRITICAL RESULT CALLED TO, READ BACK BY AND VERIFIED WITHLavell Luster PHARMD [id number] 07/23/18 A BROWNING    Staphylococcus aureus (BCID) DETECTED (A) NOT DETECTED Final    Comment: Methicillin (oxacillin) susceptible Staphylococcus aureus (MSSA). Preferred therapy is anti staphylococcal beta lactam antibiotic (Cefazolin or Nafcillin), unless clinically contraindicated. CRITICAL RESULT CALLED TO, READ BACK BY AND VERIFIED WITH:  Lavell Luster PHARMD [id number] 07/23/18 A BROWNING    Methicillin resistance NOT DETECTED NOT DETECTED Final   Streptococcus species NOT DETECTED NOT DETECTED Final   Streptococcus agalactiae NOT DETECTED NOT DETECTED Final   Streptococcus pneumoniae NOT DETECTED NOT DETECTED Final   Streptococcus pyogenes NOT DETECTED NOT DETECTED Final   Acinetobacter baumannii NOT DETECTED NOT DETECTED Final   Enterobacteriaceae species NOT DETECTED NOT DETECTED Final   Enterobacter cloacae complex NOT DETECTED NOT DETECTED Final   Escherichia coli NOT DETECTED NOT DETECTED Final   Klebsiella oxytoca NOT DETECTED NOT DETECTED Final   Klebsiella pneumoniae NOT DETECTED NOT DETECTED Final   Proteus species NOT DETECTED NOT DETECTED Final   Serratia marcescens NOT DETECTED NOT DETECTED Final    Haemophilus influenzae NOT DETECTED NOT DETECTED Final   Neisseria meningitidis NOT DETECTED NOT DETECTED Final   Pseudomonas aeruginosa NOT DETECTED NOT DETECTED Final   Candida albicans NOT DETECTED NOT DETECTED Final   Candida glabrata NOT DETECTED NOT DETECTED Final   Candida krusei NOT DETECTED NOT DETECTED Final   Candida parapsilosis NOT DETECTED NOT DETECTED Final   Candida tropicalis NOT DETECTED NOT DETECTED Final    Comment: Performed at [facility], [place] [address], [place], [place] [address]  Culture, blood (Routine x 2)     Status: Abnormal (Preliminary result)   Collection Time: 07/22/18 11:45 AM  Result Value Ref Range Status   Specimen Description   Final    BLOOD LEFT ANTECUBITAL Performed at [facility] [address], [place], [place] [address]    Special Requests   Final    BOTTLES DRAWN AEROBIC AND ANAEROBIC Blood Culture adequate volume Performed at [facility] [address], [place], [place] [address]    Culture  Setup Time   Final    GRAM POSITIVE COCCI IN BOTH AEROBIC AND ANAEROBIC BOTTLES CRITICAL VALUE NOTED.  VALUE IS CONSISTENT WITH PREVIOUSLY REPORTED AND CALLED VALUE. Performed at [facility], [place] [address], [place], [place] [address]    Culture STAPHYLOCOCCUS AUREUS (A)  Final   Report Status PENDING  Incomplete  Urine culture     Status: Abnormal   Collection Time: 07/22/18 12:36 PM  Result Value Ref Range Status   Specimen Description   Final    URINE, CATHETERIZED Performed at [facility] [address] [place], [place] [address]    Special Requests   Final    Normal Performed at [facility], [place] [address], [place], [place] [address]    Culture >=100,000 COLONIES/mL STAPHYLOCOCCUS AUREUS (A)  Final   Report Status 07/24/2018 FINAL  Final   Organism ID, Bacteria STAPHYLOCOCCUS AUREUS (A)  Final      Susceptibility   Staphylococcus aureus -  MIC*    CIPROFLOXACIN <=0.5 SENSITIVE Sensitive     GENTAMICIN <=0.5 SENSITIVE Sensitive     NITROFURANTOIN <=16 SENSITIVE Sensitive     OXACILLIN <=0.25 SENSITIVE Sensitive     TETRACYCLINE <=1 SENSITIVE Sensitive     VANCOMYCIN <=0.5 SENSITIVE Sensitive     TRIMETH/SULFA 80 RESISTANT Resistant     CLINDAMYCIN <=0.25 SENSITIVE Sensitive     RIFAMPIN <=0.5 SENSITIVE Sensitive     Inducible Clindamycin NEGATIVE Sensitive     * >=100,000 COLONIES/mL STAPHYLOCOCCUS AUREUS  MRSA PCR Screening     Status: Abnormal   Collection Time: 07/23/18  2:03 AM  Result Value Ref Range Status   MRSA by PCR POSITIVE (A) NEGATIVE  Final    Comment:        The GeneXpert MRSA Assay (FDA approved for NASAL specimens only), is one component of a comprehensive MRSA colonization surveillance program. It is not intended to diagnose MRSA infection nor to guide or monitor treatment for MRSA infections. RESULT CALLED TO, READ BACK BY AND VERIFIED WITH: HILL,D RN @0353  ON 07/23/18 JACKSON,K Performed at [facility], [place] [address], [place], [place] [address]          Radiology Studies: Ct Head Wo Contrast  Result Date: 07/22/2018 CLINICAL DATA:  Altered level of consciousness, sepsis EXAM: CT HEAD WITHOUT CONTRAST TECHNIQUE: Contiguous axial images were obtained from the base of the skull through the vertex without intravenous contrast. COMPARISON:  07/01/2018 FINDINGS: Brain: Similar atrophy pattern and chronic white matter microvascular ischemic changes throughout both cerebral hemispheres. No acute intracranial hemorrhage, mass lesion, new infarction, midline shift, herniation, hydrocephalus, or extra-axial fluid collection. No focal mass effect or edema. Cisterns are patent. Minor cerebellar atrophy as well. Vascular: No hyperdense vessel or unexpected calcification. Skull: Normal. Negative for fracture or focal lesion. Sinuses/Orbits: No acute finding. Other: None. IMPRESSION: Stable  atrophy and chronic white matter microvascular ischemic changes. No acute intracranial abnormality by noncontrast CT. Electronically Signed   By: Jerilynn Mages.  Shick M.D.   On: 07/22/2018 13:47   US Abdomen Complete  Result Date: 07/23/2018 CLINICAL DATA:  Elevated liver function tests. EXAM: ABDOMEN ULTRASOUND COMPLETE COMPARISON:  CT of the abdomen without contrast on 07/22/2018 FINDINGS: Gallbladder: No gallstones or wall thickening visualized. No sonographic Murphy sign noted by sonographer. Common bile duct: Diameter: 5 mm. Liver: The liver demonstrates coarse echotexture and increased echogenicity, likely reflecting diffuse steatosis. No overt cirrhotic contour abnormalities or focal lesions are identified. There is no evidence of intrahepatic biliary ductal dilatation. Left lobe hepatic cyst shows mild internal septation and measures approximately 2.6 cm in greatest diameter. No solid masses appreciated by ultrasound. Portal vein is patent on color Doppler imaging with normal direction of blood flow towards the liver. IVC: No abnormality visualized. Pancreas: Visualized portion unremarkable. Spleen: Size and appearance within normal limits. Right Kidney: Length: 12.6 cm. Echogenicity within normal limits. No solid mass or hydronephrosis visualized. 3 cm cyst of the upper pole has a benign appearance by ultrasound. Left Kidney: Length: 12.9 cm. Echogenicity within normal limits. No mass or hydronephrosis visualized. Abdominal aorta: No aneurysm visualized. Other findings: None. IMPRESSION: Increased echogenicity of the liver parenchyma consistent with diffuse steatosis. No evidence of biliary obstruction. Simple cyst in the left lobe of the liver. Electronically Signed   By: Aletta Edouard M.D.   On: 07/23/2018 08:28   Ct Renal Stone Study  Result Date: 07/22/2018 CLINICAL DATA:  Flank pain.  Urinary tract infection. EXAM: CT ABDOMEN AND PELVIS WITHOUT CONTRAST TECHNIQUE: Multidetector CT imaging of the abdomen  and pelvis was performed following the standard protocol without IV contrast. COMPARISON:  06/29/2018 FINDINGS: Lower chest: Small bilateral pleural effusions with dependent atelectasis. Hepatobiliary: Diffuse hepatic steatosis. Lobulated cyst in the lateral segment of the left lobe of the liver is stable. Gallbladder is within normal limits. Pancreas: Unremarkable Spleen: Unremarkable Adrenals/Urinary Tract: There is no hydronephrosis. No urinary calculus. There are simple cysts in both kidneys. These are not significantly changed. Bladder is grossly within normal limits. Stomach/Bowel: Sigmoid diverticulosis. No obvious mass in the colon. No evidence of small-bowel obstruction. Small hiatal hernia is noted. Vascular/Lymphatic: Atherosclerotic vascular calcifications are seen. No abnormal retroperitoneal adenopathy. Reproductive: Normal prostate.  Other: No free fluid. Musculoskeletal: No vertebral compression deformity. Postoperative changes from L5 decompression. IMPRESSION: No evidence of urinary calculus or urinary obstruction. Small pleural effusions and dependent atelectasis. Chronic changes are noted. Electronically Signed   By: Marybelle Killings M.D.   On: 07/22/2018 16:10        Scheduled Meds: . acetaminophen  650 mg Oral TID  . Chlorhexidine Gluconate Cloth  6 each Topical Q0600  . donepezil  10 mg Oral QHS  . enoxaparin (LOVENOX) injection  40 mg Subcutaneous Q24H  . escitalopram  20 mg Oral Daily  . finasteride  5 mg Oral Daily  . irbesartan  150 mg Oral Daily  . memantine  10 mg Oral BID  . mupirocin ointment  1 application Nasal BID  . pregabalin  75 mg Oral TID  . tamsulosin  0.8 mg Oral QHS   Continuous Infusions: . cefTRIAXone (ROCEPHIN)  IV 2 g (07/24/18 0559)     LOS: 2 days     Georgette Shell, MD [facility] If 7PM-7AM, please contact night-coverage [email or web address] Password [facility] 07/24/2018, 11:31 AM

## 2018-07-25 DIAGNOSIS — N390 Urinary tract infection, site not specified: Secondary | ICD-10-CM

## 2018-07-25 DIAGNOSIS — R7881 Bacteremia: Secondary | ICD-10-CM

## 2018-07-25 DIAGNOSIS — L03113 Cellulitis of right upper limb: Secondary | ICD-10-CM

## 2018-07-25 DIAGNOSIS — F0390 Unspecified dementia without behavioral disturbance: Secondary | ICD-10-CM

## 2018-07-25 DIAGNOSIS — Z87891 Personal history of nicotine dependence: Secondary | ICD-10-CM

## 2018-07-25 DIAGNOSIS — G9340 Encephalopathy, unspecified: Secondary | ICD-10-CM

## 2018-07-25 DIAGNOSIS — B9561 Methicillin susceptible Staphylococcus aureus infection as the cause of diseases classified elsewhere: Secondary | ICD-10-CM

## 2018-07-25 LAB — HEPATITIS PANEL, ACUTE
Hep A IgM: NEGATIVE
Hep B C IgM: NEGATIVE
Hepatitis B Surface Ag: NEGATIVE

## 2018-07-25 LAB — CULTURE, BLOOD (ROUTINE X 2)
Special Requests: ADEQUATE
Special Requests: ADEQUATE

## 2018-07-25 LAB — TSH: TSH: 1.418 u[IU]/mL (ref 0.350–4.500)

## 2018-07-25 MED ORDER — ACETAMINOPHEN 650 MG RE SUPP
650.0000 mg | Freq: Four times a day (QID) | RECTAL | Status: DC | PRN
  Administered 2018-07-25 – 2018-07-30 (×2): 650 mg via RECTAL
  Filled 2018-07-25 (×2): qty 1

## 2018-07-25 NOTE — Consult Note (Signed)
[facility] for Infectious Disease  Total days of antibiotics4        Day 2 cefazolin       Reason for Consult: MSSA bacteremia    Referring Physician: auto  Principal Problem:   Sepsis ([place]) Active Problems:   Essential hypertension   Dementia ([place])   Acute encephalopathy   Acute lower UTI   Transaminitis    HPI: Ricky Black is a 78 y.o. male with history of moderate dementia and most recently in SNF in the last month due to having more dependent needs, more recently bed bound. He has had a recent hospitalization for back pain found to have MSSA UTI though imaging did not suggest pyelonephritis. Interestingly, UA that corresponded to + urine cx had few WBC or bacteria. He did have blood cx drawn at that time, however, he had been on abtx to treat UTI. He was discharged on FQ to treat UTI.  Patient is unable to provide history. He was found less responsive with high fever of 101F on day of admit, thus take to the ED for evaluation. He was found to have temp 101,HR 90s, RR 30s. WBC 13K. LA<2. His infectious work up revealed MSSA bacteremia in addn to + Urine cx with MSSA. cxr does not suggest signs of infiltrate per my read. On exam his has right dorsum of had swelling and erythema. He still remains encephalopathic, sleepy.not back to his baseline.   Past Medical History:  Diagnosis Date  . Abnormal EKG    Prior tracing  with reading can't rule out old septal MI  . Bradycardia   . Dementia ([place])   . Dizziness   . Hypogonadism male   . Neuropathy    (R) Dorsal foot  . OAB (overactive bladder)   . Olecranon bursitis   . Orthostatic hypotension   . Spinal arthritis    ESI    Allergies: No Known Allergies  MEDICATIONS: . acetaminophen  650 mg Oral TID  . Chlorhexidine Gluconate Cloth  6 each Topical Q0600  . donepezil  10 mg Oral QHS  . enoxaparin (LOVENOX) injection  40 mg Subcutaneous Q24H  . escitalopram  20 mg Oral Daily  . finasteride  5 mg Oral Daily  .  irbesartan  150 mg Oral Daily  . memantine  10 mg Oral BID  . mupirocin ointment  1 application Nasal BID  . pregabalin  75 mg Oral TID  . tamsulosin  0.8 mg Oral QHS    Social History   Tobacco Use  . Smoking status: Former Research scientist (life sciences)  . Smokeless tobacco: Never Used  Substance Use Topics  . Alcohol use: No  . Drug use: Not on file    Family History  Family history unknown: Yes    Review of Systems - Unable to obtain due to encephalopathy/dementia OBJECTIVE: Temp:  [98.5 F (36.9 C)-100 F (37.8 C)] 99.3 F (37.4 C) (01/06 1442) Pulse Rate:  [62-80] 80 (01/06 1442) Resp:  [20-22] 22 (01/06 0457) BP: (150-160)/(69-81) 160/81 (01/06 1442) SpO2:  [96 %-100 %] 96 % (01/06 1442) Physical Exam  Constitutional: He is oriented to person,-eyes open to name. He appears well-developed and well-nourished. No distress.  HENT:  Mouth/Throat: Oropharynx is clear and moist. No oropharyngeal exudate.  Cardiovascular: Normal rate, regular rhythm and normal heart sounds. Exam reveals no gallop and no friction rub.  No murmur heard.  Pulmonary/Chest: Effort normal and breath sounds normal. No respiratory distress. He has no wheezes.  Abdominal: Soft. Bowel sounds are normal. He exhibits no distension. There is no tenderness.  Lymphadenopathy:  He has no cervical adenopathy.  Neurological: He is alert and oriented to person,- when calling his name Skin:right dorsum of hand is red, warmth+ Psychiatric: sleepy   LABS: Lab Results  Component Value Date   WBC 11.8 (H) 07/23/2018   HGB 11.4 (L) 07/23/2018   HCT 36.3 (L) 07/23/2018   MCV 104.6 (H) 07/23/2018   PLT 180 07/23/2018    MICRO: 1/3 blood cx MSSA 1/3 urine cx MSSA IMAGING: Dg Chest 1 View  Result Date: 07/24/2018 CLINICAL DATA:  Shortness of breath and weakness. EXAM: CHEST  1 VIEW COMPARISON:  07/22/2018 FINDINGS: Lungs are hypoinflated with mild bibasilar opacification which may be due to atelectasis or infection.  Cardiomediastinal silhouette and remainder the exam is unchanged. IMPRESSION: Bibasilar opacification which may be due to atelectasis or infection. Electronically Signed   By: Marin Olp M.D.   On: 07/24/2018 12:34    Assessment/Plan:    78yo M with moderate dementia admitted for disseminated MSSA, likely ascending infection since had recent hospitalization with MSSA UTI.  - continue on cefazolin 2gm IV Q 8hr - will repeat blood cx today to document clearance of bacteremia - recommend to get TTE. Ideally he needs TEE- however he will need to be more alert to undergo procedure - for now plan to treat for complicated bacteremia with 4 wk of IV abtx - place picc line at the earliest on 1/9.  Encephalopathy = ideally will see if improves as infection is treated. Agree with plan to cut back on benzos and opiates  Right hand cellulitis = unclear if septic thrombophlebitis from previous PIV. Will continue to monitor. Elevated and icepack

## 2018-07-25 NOTE — Care Management Important Message (Signed)
Important Message  Patient Details  Name: DEWAYNE SEVERE MRN: [id number] Date of Birth: Mar 05, 1941   Medicare Important Message Given:  Yes    Kerin Salen 07/25/2018, 1:28 [place] Message  Patient Details  Name: ABEDNEGO YEATES MRN: [id number] Date of Birth: 06/18/1941   Medicare Important Message Given:  Yes    Kerin Salen 07/25/2018, 1:27 PM

## 2018-07-25 NOTE — Progress Notes (Signed)
LCSW following for SNF placement.   Patient has bed [facility] at [facility].   LCSW will continue to follow.   Carolin Coy [place]

## 2018-07-25 NOTE — Clinical Social Work Note (Signed)
LCSW consulted for SNF placement. Patient assessed 12/15 and dc to [facility]. No significant changes since previous assessment. PT recommending SNF and agreeable.    Clinical Social Work Assessment  Patient Details  Name: Ricky Black MRN: [id number] Date of Birth: 1940-08-08  Date of referral:                  Reason for consult:                   Permission sought to share information with:    Permission granted to share information::     Name::        Agency::     Relationship::     Contact Information:     Housing/Transportation Living arrangements for the past 2 months:    Source of Information:    Patient Interpreter Needed:    Criminal Activity/Legal Involvement Pertinent to Current Situation/Hospitalization:    Significant Relationships:    Lives with:    Do you feel safe going back to the place where you live?    Need for family participation in patient care:     Care giving concerns:  CSW consulted as CSW made aware that pt's wife is refusing to take pt back home with home health services that were suggested by PT.   Social Worker assessment / plan:  CSW's  went to speak with pt's wife at bedside. CSW's met pt's daughter Ricky Black at bedside and was informed that CSW's should speak with pt's wife Ricky Black. CSW's agreeable and called pt's wife once answering questions of pt's daughter. CSW's spoke with pt's wife Ricky Black via phone. During this time CSW's goals were to give update on discharge planning needs. CSW's began conversation by expressing to pt's wife that CSW's were calling to address further needs of SNF placement as CSW's made aware that pt's wife was interested in pt going to SNF for placement. CSW's attempted to give updates and present list to pt's wife of available facilities since pt has not had a three night qualifying inpt stay. Pt's wife Ricky Black immediatly became very aggressive, yelling, an d speaking over CSW's while attempting to ensure that Ricky Black understood  what was taking place. CSW's allowed Ricky Black to speak however CSW's were constantly cut off while attempting to explain placment to wife.   Ricky Black expressed that she need pt's PASAR in order to get placement. CSW advised Ricky Black that this is true, however pt's insurance would be the determining factor in whether insurance would cover the cost of SNF placement. Wife again began to yell at CSW's that if pt was admitted for three days then pt could go to [facility] as she wants pt. CSW advised her that [facility] is not on the list of 3 day SNF waivers however CSW did try and redirect the conversation by telling Ricky Black what options pt's may have. Ricky Black still yelling and telling CSW's that she will speak with other family members as well as she would speak with MD. CSW's ended conversation with Ricky Black by asking for permission to send information out to facilities that pt has access to and [place] expressed "No I will tell you were to send it". CSW's politely  explanted that since CSW's are not able to send information out to possible facilities then pt may be discharge back home with her for home health services.   Employment status:    Insurance information:    PT Recommendations:    Information / Referral to community  resources:     Patient/Family's Response to care:  Pt's wife is very update an demanding that pt remain in the hospital for three night qualifying stay so that pt can go to [facility].   Patient/Family's Understanding of and Emotional Response to Diagnosis, Current Treatment, and Prognosis:  Understanding of care for pt's wife is very unclear. Wife appears to want facilities that are not contracted with the three day waiver form. Wife expressed being angry and displease with MD and staff in general as she feels that hospital staff has not done there part. CSW offered understanding to [place] however she still remained upset.  Emotional Assessment Appearance:    Attitude/Demeanor/Rapport:     Affect (typically observed):    Orientation:    Alcohol / Substance use:    Psych involvement (Current and /or in the community):     Discharge Needs  Concerns to be addressed:    Readmission within the last 30 days:    Current discharge risk:    Barriers to Discharge:      Servando Snare, LCSW 07/25/2018, 11:24 AM

## 2018-07-25 NOTE — Progress Notes (Signed)
PROGRESS NOTE    Ricky Black  NGE:[id number] DOB: [birth year] DOA: 07/22/2018 PCP: Marton Redwood, MD   Brief Narrative: 78 y.o.Mwith hx dementia, previously home dwelling, HTN, and 2 recent admissions for MSSA UTIwho presents with slow progressive weakness.  Caveat that patient is unable to provide any history, which is collected from wife at bedside instead.   The patient was discharged mid-December for UTI, MSSA, discharged on nitrofurantoin. He was seeming to do better for a time, ambulating short distances, but then in the last 1-2 weeks has started to decline again, no longer getting out of bed, weaker and weaker, less responsive until today he had a fever and was sent to the ER. There has been no cough or respiratory distress. There is been no obvious urinary symptoms. No vomiting or reported abdominal pain.  ED course: -Temp 101F, heart rate 91, respirations 31,blood pressure 130/79 -Na139, K4.0, Cr0.9, WBC13.3K, Hgb13 -Transaminases 60s, Tbili 1.4 -INR 1.1 -Lactic acid 1.7 -UA showed many RBCs and WBCs -CXR showed no pneumonia or effusion -CT head unremarkable He was given ceftriaxone and IV fluidsin the hospitalist service were asked to evaluate for admission  07/25/2018 patient seen and examined 2 daughters by the bedside with concerns regarding his weakness as well as recurrent urinary infection they report that patient is having difficulty swallowing and is coughing when he is swallowing.  They are also concerned that he is deconditioned and has generalized weakness.  They are requesting a speech evaluation.  A chest x-ray done last night showed bibasilar atelectasis versus infiltrates  Assessment & Plan:   Principal Problem:   Sepsis ([place]) Active Problems:   Essential hypertension   Dementia ([facility])   Acute encephalopathy   Acute lower UTI   Transaminitis   MSSA bacteremia-urine culture positive for MSSA antibiotics changed to Ancef.  Patient had  urinary retention over 300 cc of Foley catheter was placed.  Will most likely need to be discharged back to SNF with a Foley in place and have him follow-up with urologist.  Discussed with Dr. Jeffie Pollock.  Patient meets criteria given tachycardia, tachypnea, fever, leukocytosis, and evidence of organ dysfunction(encephalopathy, transaminitis). -CT renal protocol-no renal stones -ContinueFlomax and Proscar -Bladder scan showed more than 300 cc of urine Foley catheter placed.  Patient has recurrent UTI  over last 1 month. -  Transaminitis -Check US abdomen-no stones, hepatic steatosis present. -Trend LFTs  Acutemetabolicencephalopathysecondary to sepsis cautious with Norco. Lorazepam stopped. CT head unremarkable. Monitor closely. Will consult speech therapy and physical therapy.  Dementia -ContinueNamenda and donepezil -ContinueSSRI  Severity patient needs close monitoring for respiratory status with cough and choking episodes and lung infiltrates versus atelectasis with bacteremia await PT OT evaluation as well as speech therapy evaluation may need modified barium swallow. Estimated body mass index is 30.43 kg/m as calculated from the following:   Height as of this encounter: 6\' 2"  (1.88 m).   Weight as of this encounter: 107.5 kg.  DVT prophylaxis: Lovenox Code Status:  full code family Communication: Discussed with 2 daughters Disposition Plan: Pending PT and speech eval continue IV antibiotics Ancef Consultants:  Discussed with urologist on the phone Dr. Jeffie Pollock  Procedures: None Antimicrobials: Ancef Subjective: Patient resting in bed awake complaining of congestion family reports generalized weakness right upper extremity swollen from an IV line which has been taken out as well as having difficulty with swallowing.  Objective: Vitals:   07/24/18 1709 07/24/18 2137 07/25/18 0156 07/25/18 0457  BP: (!) 157/71 (!) 158/74 Marland Kitchen)  152/69 (!) 150/78  Pulse: 62 73 72 77    Resp: 20 (!) 22 20 (!) 22  Temp: 98.7 F (37.1 C) 100 F (37.8 C) 98.5 F (36.9 C) 98.9 F (37.2 C)  TempSrc: Oral     SpO2: 100% 100% 100% 98%  Weight:      Height:        Intake/Output Summary (Last 24 hours) at 07/25/2018 1327 Last data filed at 07/25/2018 0800 Gross per 24 hour  Intake 820 ml  Output 160 ml  Net 660 ml   Filed Weights   07/22/18 1232  Weight: 107.5 kg    Examination:  General exam: Appears calm and comfortable  Respiratory system: Clear to auscultation. Respiratory effort normal. Cardiovascular system: S1 & S2 heard, RRR. No JVD, murmurs, rubs, gallops or clicks. No pedal edema. Gastrointestinal system: Abdomen is nondistended, soft and nontender. No organomegaly or masses felt. Normal bowel sounds heard. Central nervous system: Alert and oriented. No focal neurological deficits. Extremities: Symmetric 5 x 5 power. Skin: No rashes, lesions or ulcers Psychiatry: Judgement and insight appear normal. Mood & affect appropriate.     Data Reviewed: I have personally reviewed following labs and imaging studies  CBC: Recent Labs  Lab 07/22/18 1005 07/23/18 0658  WBC 13.3* 11.8*  NEUTROABS 12.3*  --   HGB 13.0 11.4*  HCT 40.3 36.3*  MCV 103.6* 104.6*  PLT 201 546   Basic Metabolic Panel: Recent Labs  Lab 07/22/18 1005 07/23/18 0658  NA 139 144  K 4.0 3.6  CL 100 110  CO2 24 23  GLUCOSE 190* 194*  BUN 46* 43*  CREATININE 0.94 0.81  CALCIUM 9.0 8.3*   GFR: Estimated Creatinine Clearance: 96.5 mL/min (by C-G formula based on SCr of 0.81 mg/dL). Liver Function Tests: Recent Labs  Lab 07/22/18 1005 07/23/18 0658  AST 63* 71*  ALT 63* 64*  ALKPHOS 107 83  BILITOT 1.4* 0.5  PROT 7.3 6.3*  ALBUMIN 2.7* 2.1*   No results for input(s): LIPASE, AMYLASE in the last 168 hours. No results for input(s): AMMONIA in the last 168 hours. Coagulation Profile: Recent Labs  Lab 07/22/18 1005  INR 1.19   Cardiac Enzymes: No results for  input(s): CKTOTAL, CKMB, CKMBINDEX, TROPONINI in the last 168 hours. BNP (last 3 results) No results for input(s): PROBNP in the last 8760 hours. HbA1C: No results for input(s): HGBA1C in the last 72 hours. CBG: No results for input(s): GLUCAP in the last 168 hours. Lipid Profile: No results for input(s): CHOL, HDL, LDLCALC, TRIG, CHOLHDL, LDLDIRECT in the last 72 hours. Thyroid Function Tests: No results for input(s): TSH, T4TOTAL, FREET4, T3FREE, THYROIDAB in the last 72 hours. Anemia Panel: No results for input(s): VITAMINB12, FOLATE, FERRITIN, TIBC, IRON, RETICCTPCT in the last 72 hours. Sepsis Labs: Recent Labs  Lab 07/22/18 1012 07/22/18 1155  LATICACIDVEN 1.72 1.35    Recent Results (from the past 240 hour(s))  Culture, blood (Routine x 2)     Status: Abnormal   Collection Time: 07/22/18 10:05 AM  Result Value Ref Range Status   Specimen Description BLOOD BLOOD RIGHT FOREARM  Final   Special Requests   Final    BOTTLES DRAWN AEROBIC AND ANAEROBIC Blood Culture adequate volume Performed at [facility] [address], [place], [place] [address]    Culture  Setup Time   Final    IN BOTH AEROBIC AND ANAEROBIC BOTTLES GRAM POSITIVE COCCI CRITICAL RESULT CALLED TO, READ BACK BY AND  VERIFIED WITHLavell Luster [facility] 9622 07/23/18 A BROWNING    Culture STAPHYLOCOCCUS AUREUS (A)  Final   Report Status 07/25/2018 FINAL  Final   Organism ID, Bacteria STAPHYLOCOCCUS AUREUS  Final      Susceptibility   Staphylococcus aureus - MIC*    CIPROFLOXACIN <=0.5 SENSITIVE Sensitive     ERYTHROMYCIN <=0.25 SENSITIVE Sensitive     GENTAMICIN <=0.5 SENSITIVE Sensitive     OXACILLIN <=0.25 SENSITIVE Sensitive     TETRACYCLINE <=1 SENSITIVE Sensitive     VANCOMYCIN <=0.5 SENSITIVE Sensitive     TRIMETH/SULFA 160 RESISTANT Resistant     CLINDAMYCIN <=0.25 SENSITIVE Sensitive     RIFAMPIN <=0.5 SENSITIVE Sensitive     Inducible Clindamycin NEGATIVE Sensitive     *  STAPHYLOCOCCUS AUREUS  Blood Culture ID Panel (Reflexed)     Status: Abnormal   Collection Time: 07/22/18 10:05 AM  Result Value Ref Range Status   Enterococcus species NOT DETECTED NOT DETECTED Final   Listeria monocytogenes NOT DETECTED NOT DETECTED Final   Staphylococcus species DETECTED (A) NOT DETECTED Final    Comment: CRITICAL RESULT CALLED TO, READ BACK BY AND VERIFIED WITHLavell Luster [facility] [id number] 07/23/18 A BROWNING    Staphylococcus aureus (BCID) DETECTED (A) NOT DETECTED Final    Comment: Methicillin (oxacillin) susceptible Staphylococcus aureus (MSSA). Preferred therapy is anti staphylococcal beta lactam antibiotic (Cefazolin or Nafcillin), unless clinically contraindicated. CRITICAL RESULT CALLED TO, READ BACK BY AND VERIFIED WITHLavell Luster PHARMD [id number] 07/23/18 A BROWNING    Methicillin resistance NOT DETECTED NOT DETECTED Final   Streptococcus species NOT DETECTED NOT DETECTED Final   Streptococcus agalactiae NOT DETECTED NOT DETECTED Final   Streptococcus pneumoniae NOT DETECTED NOT DETECTED Final   Streptococcus pyogenes NOT DETECTED NOT DETECTED Final   Acinetobacter baumannii NOT DETECTED NOT DETECTED Final   Enterobacteriaceae species NOT DETECTED NOT DETECTED Final   Enterobacter cloacae complex NOT DETECTED NOT DETECTED Final   Escherichia coli NOT DETECTED NOT DETECTED Final   Klebsiella oxytoca NOT DETECTED NOT DETECTED Final   Klebsiella pneumoniae NOT DETECTED NOT DETECTED Final   Proteus species NOT DETECTED NOT DETECTED Final   Serratia marcescens NOT DETECTED NOT DETECTED Final   Haemophilus influenzae NOT DETECTED NOT DETECTED Final   Neisseria meningitidis NOT DETECTED NOT DETECTED Final   Pseudomonas aeruginosa NOT DETECTED NOT DETECTED Final   Candida albicans NOT DETECTED NOT DETECTED Final   Candida glabrata NOT DETECTED NOT DETECTED Final   Candida krusei NOT DETECTED NOT DETECTED Final   Candida parapsilosis NOT DETECTED NOT DETECTED Final    Candida tropicalis NOT DETECTED NOT DETECTED Final    Comment: Performed at [facility], [place] [address] [place], [place] [address]  Culture, blood (Routine x 2)     Status: Abnormal   Collection Time: 07/22/18 11:45 AM  Result Value Ref Range Status   Specimen Description   Final    BLOOD LEFT ANTECUBITAL Performed at [facility] [address], [place], [place] [address]    Special Requests   Final    BOTTLES DRAWN AEROBIC AND ANAEROBIC Blood Culture adequate volume Performed at [facility] [address], [place], [place] [address]    Culture  Setup Time   Final    GRAM POSITIVE COCCI IN BOTH AEROBIC AND ANAEROBIC BOTTLES CRITICAL VALUE NOTED.  VALUE IS CONSISTENT WITH PREVIOUSLY REPORTED AND CALLED VALUE. Performed at [facility], [place] [address], [place], [place] [address]  Culture (A)  Final    STAPHYLOCOCCUS AUREUS SUSCEPTIBILITIES PERFORMED ON PREVIOUS CULTURE WITHIN THE LAST 5 DAYS.    Report Status 07/25/2018 FINAL  Final  Urine culture     Status: Abnormal   Collection Time: 07/22/18 12:36 PM  Result Value Ref Range Status   Specimen Description   Final    URINE, CATHETERIZED Performed at [facility], [place] [address], [place], [place] [address]    Special Requests   Final    Normal Performed at [facility], [place] [address], [place], [place] [address]    Culture >=100,000 COLONIES/mL STAPHYLOCOCCUS AUREUS (A)  Final   Report Status 07/24/2018 FINAL  Final   Organism ID, Bacteria STAPHYLOCOCCUS AUREUS (A)  Final      Susceptibility   Staphylococcus aureus - MIC*    CIPROFLOXACIN <=0.5 SENSITIVE Sensitive     GENTAMICIN <=0.5 SENSITIVE Sensitive     NITROFURANTOIN <=16 SENSITIVE Sensitive     OXACILLIN <=0.25 SENSITIVE Sensitive     TETRACYCLINE <=1 SENSITIVE Sensitive     VANCOMYCIN <=0.5 SENSITIVE Sensitive     TRIMETH/SULFA 80 RESISTANT Resistant     CLINDAMYCIN  <=0.25 SENSITIVE Sensitive     RIFAMPIN <=0.5 SENSITIVE Sensitive     Inducible Clindamycin NEGATIVE Sensitive     * >=100,000 COLONIES/mL STAPHYLOCOCCUS AUREUS  MRSA PCR Screening     Status: Abnormal   Collection Time: 07/23/18  2:03 AM  Result Value Ref Range Status   MRSA by PCR POSITIVE (A) NEGATIVE Final    Comment:        The GeneXpert MRSA Assay (FDA approved for NASAL specimens only), is one component of a comprehensive MRSA colonization surveillance program. It is not intended to diagnose MRSA infection nor to guide or monitor treatment for MRSA infections. RESULT CALLED TO, READ BACK BY AND VERIFIED WITH: HILL,D RN @0353  ON 07/23/18 JACKSON,K Performed at [facility], [place] [address], [place],  [address]          Radiology Studies: Dg Chest 1 View  Result Date: 07/24/2018 CLINICAL DATA:  Shortness of breath and weakness. EXAM: CHEST  1 VIEW COMPARISON:  07/22/2018 FINDINGS: Lungs are hypoinflated with mild bibasilar opacification which may be due to atelectasis or infection. Cardiomediastinal silhouette and remainder the exam is unchanged. IMPRESSION: Bibasilar opacification which may be due to atelectasis or infection. Electronically Signed   By: Marin Olp M.D.   On: 07/24/2018 12:34        Scheduled Meds: . acetaminophen  650 mg Oral TID  . Chlorhexidine Gluconate Cloth  6 each Topical Q0600  . donepezil  10 mg Oral QHS  . enoxaparin (LOVENOX) injection  40 mg Subcutaneous Q24H  . escitalopram  20 mg Oral Daily  . finasteride  5 mg Oral Daily  . irbesartan  150 mg Oral Daily  . memantine  10 mg Oral BID  . mupirocin ointment  1 application Nasal BID  . pregabalin  75 mg Oral TID  . tamsulosin  0.8 mg Oral QHS   Continuous Infusions: .  ceFAZolin (ANCEF) IV 2 g (07/25/18 0601)     LOS: 3 days     Georgette Shell, MD [facility]  If 7PM-7AM, please contact night-coverage [email or web address] Password  [email or web address] 07/25/2018, 1:27 PM

## 2018-07-25 NOTE — Evaluation (Signed)
Clinical/Bedside Swallow Evaluation Patient Details  Name: Ricky Black MRN: [id number] Date of Birth: Jan 28, 1941  Today's Date: 07/25/2018 Time: SLP Start Time (ACUTE ONLY): 5102 SLP Stop Time (ACUTE ONLY): 1537 SLP Time Calculation (min) (ACUTE ONLY): 54 min  Past Medical History:  Past Medical History:  Diagnosis Date  . Abnormal EKG    Prior tracing  with reading can't rule out old septal MI  . Bradycardia   . Dementia ([place])   . Dizziness   . Hypogonadism male   . Neuropathy    (R) Dorsal foot  . OAB (overactive bladder)   . Olecranon bursitis   . Orthostatic hypotension   . Spinal arthritis    ESI   Past Surgical History:  Past Surgical History:  Procedure Laterality Date  . COLONOSCOPY  04/15/2012  . LUMBAR LAMINECTOMY    . TOTAL HIP ARTHROPLASTY     Right  . UMBILICAL HERNIA REPAIR     HPI:  78 yo male adm to [facility] with AMS possible sepsis after being treated for UTI with ABX with no improvement.  Pt recently in hospital for UTI in December and was discharged to a SNF.  Swallow evaluation ordered due to concern for pt possibly aspirating.   RN reports pt overtly coughing with intake today.  CXR (most recent) showed bilateral opacification - ? ATX vs infection- wife reports pt was congested yesterday - today improving after has been on ABX.  Family reports pt with lethargy and islands of alertness at SNF - per wife he had a "few good days" where he was able to ambulate with PT and sit up at edge of bed to feed himself.   Pt not turning his head to the left at this time - which family states started Friday.     Assessment / Plan / Recommendation Clinical Impression  Today pt's mentation is inadequate for po intake, as he remained with his eyes closed 99% of session.  He only opened his eyes for a few seconds at a time and followed one step directions approximately 10% of opportunities.  Pt did smile when SLP directly asked if he was playing possum - ? behavioral component.    Pt did consume ice boluses x3 with no indication of aspiration.  He declined to consume further intake.  Family also states pt coughed with coffee this am due to him "slurping it" and swallows better from a straw.  Reviewed with family importance to feed pt only when fully alert.  Discussed alternatives for liquid administration including using cup instead of straw, decreasing to tsp amounts of needed.  Offered to set up oral suction for pt use, which family declined.    Concern for long term nutrition adequacy present given pt progressive weight loss, ongoing weakness/deconditioning.  After inquiring, family desires for pt have a palliative meeting to establish pt's goals and examine "big picture", etc given his progressive decline.   Informed RN of their request.     Option of MBS when pt fully alert discussed - but family states pt has only been alert intermittently during the day since hospitalized which will negatively impact nutrition functionally regardless of MBS findings.    Suspect dysphagia is due to mentation and possibly deconditioning.  Advised would follow up clinically to determine if compensations/diet modifications, etc may be helpful to mitigate dysphagia.    SLP Visit Diagnosis: Dysphagia, oropharyngeal phase (R13.12)    Aspiration Risk  Moderate aspiration risk;Risk for inadequate nutrition/hydration  Diet Recommendation (reg/thin if tolerates)   Liquid Administration via: Cup;Straw;Spoon Supervision: Staff to assist with self feeding Compensations: Slow rate;Small sips/bites(assure pt swallows, delayed swallow noted) Postural Changes: Seated upright at 90 degrees;Remain upright for at least 30 minutes after po intake    Other  Recommendations Oral Care Recommendations: Oral care BID   Follow up Recommendations        Frequency and Duration min 1 x/week  1 week       Prognosis Prognosis for Safe Diet Advancement: Guarded Barriers to Reach Goals: Time post  onset;Other (Comment)(progressive decline over the last few weeks)      Swallow Study   General Date of Onset: 07/25/18 HPI: 78 yo male adm to [facility] with AMS possible sepsis after being treated for UTI with ABX with no improvement.  Pt recently in hospital for UTI in December and was discharged to a SNF.  Swallow evaluation ordered due to concern for pt possibly aspirating.   RN reports pt overtly coughing with intake today.  CXR (most recent) showed bilateral opacification - ? ATX vs infection- wife reports pt was congested yesterday - today improving after has been on ABX.  Family reports pt with lethargy and islands of alertness at SNF - per wife he had a "few good days" where he was able to ambulate with PT and sit up at edge of bed to feed himself.    Type of Study: Bedside Swallow Evaluation Diet Prior to this Study: Regular;Thin liquids Temperature Spikes Noted: No Respiratory Status: Room air History of Recent Intubation: No Behavior/Cognition: Lethargic/Drowsy;Doesn't follow directions Oral Cavity Assessment: Dry Oral Care Completed by SLP: No(pt would not open mouth sustained adequately for oral care) Oral Cavity - Dentition: Adequate natural dentition Vision: Impaired for self-feeding Self-Feeding Abilities: Total assist Patient Positioning: Upright in bed Baseline Vocal Quality: Low vocal intensity(when pt agitated his voice is strong, no wet gurgly quality) Volitional Cough: Cognitively unable to elicit Volitional Swallow: Unable to elicit    Oral/Motor/Sensory Function Overall Oral Motor/Sensory Function: Generalized oral weakness   Ice Chips Ice chips: Impaired Presentation: Spoon Pharyngeal Phase Impairments: Suspected delayed Swallow Other Comments: no indication of aspiration with ice chips   Thin Liquid Thin Liquid: Not tested Other Comments: pt declined to consume    Nectar Thick Nectar Thick Liquid: Not tested Other Comments: pt declined to consume   Honey Thick  Honey Thick Liquid: Not tested   Puree Puree: Not tested Other Comments: pt declined    Solid     Solid: Not tested Other Comments: pt declined and not alert enough for po intake      Macario Golds 07/25/2018,4:05 PM Luanna Salk, [place] Pager [phone] Office [phone]

## 2018-07-25 NOTE — Progress Notes (Addendum)
Physical Therapy Treatment Patient Details Name: ADRIAAN MALTESE MRN: [id number] DOB: 10-26-1940 Today's Date: 07/25/2018    History of Present Illness LEDARIUS LEESON is a 78 y.o. M with hx dementia, previously home dwelling, HTN, and 2 recent admissions for MSSA UTI who presents with slow progressive weakness.  In December hospitalized for the same and went to SNF.      PT Comments    The patient aroused with stimulation, did speak a few words. Patient is noted to demonstrate nil to trace any movement of the  UE/LE's. Patient's limbs are flaccid as well as trunk when sitting on bed edge. Patient's family present and report that he has not been moving his limbs.  Continue PT efforts.   Follow Up Recommendations  SNF;Supervision/Assistance - 24 hour     Equipment Recommendations  None recommended by PT    Recommendations for Other Services       Precautions / Restrictions      Mobility  Bed Mobility   Bed Mobility: Supine to Sit;Sit to Supine     Supine to sit: Total assist Sit to supine: Total assist   General bed mobility comments: The patient  offered no assistance with  moving legs to bed edge nor back onto bed, no UE movements noted spontaneous. the patient essentially is flaccid   Transfers                 General transfer comment: NT  Ambulation/Gait                 Stairs             Wheelchair Mobility    Modified Rankin (Stroke Patients Only)       Balance Overall balance assessment: Needs assistance Sitting-balance support: Feet supported Sitting balance-Leahy Scale: Zero                                      Cognition                                              Exercises      General Comments        Pertinent Vitals/Pain      Home Living                      Prior Function            PT Goals (current goals can now be found in the care plan section) Progress towards PT  goals: (no, flaccid extremeties and trunk)    Frequency    Min 2X/week      PT Plan      Co-evaluation              AM-PAC PT "6 Clicks" Mobility   Outcome Measure  Help needed turning from your back to your side while in a flat bed without using bedrails?: Total Help needed moving from lying on your back to sitting on the side of a flat bed without using bedrails?: Total Help needed moving to and from a bed to a chair (including a wheelchair)?: Total Help needed standing up from a chair using your arms (e.g., wheelchair or bedside chair)?: Total Help needed to walk in hospital room?: Total Help  needed climbing 3-5 steps with a railing? : Total 6 Click Score: 6    End of Session   Activity Tolerance: Patient limited by fatigue;Treatment limited secondary to medical complications (Comment)(no volitional movements are noted) Patient left: in bed;with call bell/phone within reach;with family/visitor present;with bed alarm set Nurse Communication: Mobility status;Need for lift equipment PT Visit Diagnosis: Other abnormalities of gait and mobility (R26.89);Other symptoms and signs involving the nervous system (R29.898)     Time: 3496-1164 PT Time Calculation (min) (ACUTE ONLY): 28 min  Charges:  $Therapeutic Activity: 23-37 mins                     Tresa Endo PT Acute Rehabilitation Services Pager [phone] Office [phone]    Claretha Cooper 07/25/2018, 12:42 PM

## 2018-07-25 NOTE — NC FL2 (Signed)
[place] LEVEL OF CARE SCREENING TOOL     IDENTIFICATION  Patient Name: Ricky Black Birthdate: [birth year] Sex: male Admission Date (Current Location): 07/22/2018  [facility] and [place] Number:  Herbalist and Address:  [facility],  [place], [place]      Provider Number: [id number]  Attending Physician Name and Address:  Georgette Shell, MD  Relative Name and Phone Number:       Current Level of Care: Hospital Recommended Level of Care: [place] Prior Approval Number:    Date Approved/Denied:   PASRR Number:  [id number]  Discharge Plan: SNF    Current Diagnoses: Patient Active Problem List   Diagnosis Date Noted  . Sepsis ([place]) 07/22/2018  . Acute lower UTI 07/22/2018  . Transaminitis 07/22/2018  . Acute encephalopathy 07/02/2018  . Acute cystitis with hematuria 07/02/2018  . Back pain 07/01/2018  . Delirium 07/01/2018  . Hematuria, microscopic 07/01/2018  . Bradycardia   . Dizziness   . Orthostatic hypotension   . Spinal arthritis   . Neuropathy   . Hypogonadism male   . Dementia ([place])   . Abnormal EKG   . Essential hypertension 05/10/2007  . ARTHRITIS 05/10/2007    Orientation RESPIRATION BLADDER Height & Weight     Self, Place(hx of dementia)  Normal Incontinent Weight: 237 lb (107.5 kg) Height:  6\' 2"  (188 cm)  BEHAVIORAL SYMPTOMS/MOOD NEUROLOGICAL BOWEL NUTRITION STATUS      Incontinent Diet(See dc summary)  AMBULATORY STATUS COMMUNICATION OF NEEDS Skin   Extensive Assist Verbally Normal                       Personal Care Assistance Level of Assistance  Bathing, Feeding, Dressing Bathing Assistance: Maximum assistance Feeding assistance: Limited assistance Dressing Assistance: Maximum assistance     Functional Limitations Info  Sight, Hearing, Speech Sight Info: Adequate Hearing Info: Adequate Speech Info: Adequate    SPECIAL CARE FACTORS FREQUENCY  PT  (By licensed PT), OT (By licensed OT)     PT Frequency: 5x/week OT Frequency: 5x/week            Contractures Contractures Info: Not present    Additional Factors Info  Code Status, Allergies Code Status Info: Full Allergies Info: NKA           Current Medications (07/25/2018):  This is the current hospital active medication list Current Facility-Administered Medications  Medication Dose Route Frequency Provider Last Rate Last Dose  . acetaminophen (TYLENOL) tablet 650 mg  650 mg Oral TID Georgette Shell, MD   650 mg at 07/25/18 0825  . ceFAZolin (ANCEF) IVPB 2g/100 mL premix  2 g Intravenous Q8H Georgette Shell, MD 200 mL/hr at 07/25/18 0601 2 g at 07/25/18 0601  . Chlorhexidine Gluconate Cloth 2 % PADS 6 each  6 each Topical Q0600 Edwin Dada, MD   6 each at 07/24/18 [id number]  . donepezil (ARICEPT) tablet 10 mg  10 mg Oral QHS Edwin Dada, MD   10 mg at 07/24/18 2202  . enoxaparin (LOVENOX) injection 40 mg  40 mg Subcutaneous Q24H Edwin Dada, MD   40 mg at 07/24/18 2202  . escitalopram (LEXAPRO) tablet 20 mg  20 mg Oral Daily Edwin Dada, MD   20 mg at 07/25/18 0826  . finasteride (PROSCAR) tablet 5 mg  5 mg Oral Daily Danford, Suann Larry, MD   5 mg at 07/25/18  6222  . irbesartan (AVAPRO) tablet 150 mg  150 mg Oral Daily Edwin Dada, MD   150 mg at 07/25/18 0826  . memantine (NAMENDA) tablet 10 mg  10 mg Oral BID Edwin Dada, MD   10 mg at 07/25/18 0826  . mupirocin ointment (BACTROBAN) 2 % 1 application  1 application Nasal BID Edwin Dada, MD   1 application at 97/98/92 0827  . ondansetron (ZOFRAN) tablet 4 mg  4 mg Oral Q6H PRN Danford, Suann Larry, MD       Or  . ondansetron (ZOFRAN) injection 4 mg  4 mg Intravenous Q6H PRN Danford, Suann Larry, MD      . pregabalin (LYRICA) capsule 75 mg  75 mg Oral TID Edwin Dada, MD   75 mg at 07/25/18 0826  . senna-docusate (Senokot-S)  tablet 1 tablet  1 tablet Oral QHS PRN Danford, Suann Larry, MD      . tamsulosin (FLOMAX) capsule 0.8 mg  0.8 mg Oral QHS Edwin Dada, MD   0.8 mg at 07/24/18 2202     Discharge Medications: Please see discharge summary for a list of discharge medications.  Relevant Imaging Results:  Relevant Lab Results:   Additional Information [phone].   Servando Snare, LCSW

## 2018-07-26 ENCOUNTER — Inpatient Hospital Stay (HOSPITAL_COMMUNITY): Payer: Medicare Other

## 2018-07-26 DIAGNOSIS — Z66 Do not resuscitate: Secondary | ICD-10-CM

## 2018-07-26 DIAGNOSIS — R7881 Bacteremia: Secondary | ICD-10-CM

## 2018-07-26 DIAGNOSIS — M7989 Other specified soft tissue disorders: Secondary | ICD-10-CM

## 2018-07-26 DIAGNOSIS — Z515 Encounter for palliative care: Secondary | ICD-10-CM

## 2018-07-26 DIAGNOSIS — M542 Cervicalgia: Secondary | ICD-10-CM

## 2018-07-26 LAB — CBC
HCT: 37.4 % — ABNORMAL LOW (ref 39.0–52.0)
HEMATOCRIT: 36.1 % — AB (ref 39.0–52.0)
Hemoglobin: 11.4 g/dL — ABNORMAL LOW (ref 13.0–17.0)
Hemoglobin: 11 g/dL — ABNORMAL LOW (ref 13.0–17.0)
MCH: 32.9 pg (ref 26.0–34.0)
MCH: 33 pg (ref 26.0–34.0)
MCHC: 30.5 g/dL (ref 30.0–36.0)
MCHC: 30.5 g/dL (ref 30.0–36.0)
MCV: 107.8 fL — ABNORMAL HIGH (ref 80.0–100.0)
MCV: 108.4 fL — ABNORMAL HIGH (ref 80.0–100.0)
Platelets: 233 10*3/uL (ref 150–400)
Platelets: 220 10*3/uL (ref 150–400)
RBC: 3.47 MIL/uL — ABNORMAL LOW (ref 4.22–5.81)
RBC: 3.33 MIL/uL — ABNORMAL LOW (ref 4.22–5.81)
RDW: 13.3 % (ref 11.5–15.5)
RDW: 13.4 % (ref 11.5–15.5)
WBC: 13.9 10*3/uL — ABNORMAL HIGH (ref 4.0–10.5)
WBC: 14.4 10*3/uL — ABNORMAL HIGH (ref 4.0–10.5)
nRBC: 0 % (ref 0.0–0.2)
nRBC: 0 % (ref 0.0–0.2)

## 2018-07-26 LAB — COMPREHENSIVE METABOLIC PANEL
ALT: 87 U/L — ABNORMAL HIGH (ref 0–44)
AST: 72 U/L — ABNORMAL HIGH (ref 15–41)
Albumin: 1.7 g/dL — ABNORMAL LOW (ref 3.5–5.0)
Alkaline Phosphatase: 95 U/L (ref 38–126)
Anion gap: 8 (ref 5–15)
BUN: 26 mg/dL — ABNORMAL HIGH (ref 8–23)
CO2: 25 mmol/L (ref 22–32)
CREATININE: 0.69 mg/dL (ref 0.61–1.24)
Calcium: 8.4 mg/dL — ABNORMAL LOW (ref 8.9–10.3)
Chloride: 115 mmol/L — ABNORMAL HIGH (ref 98–111)
GFR calc Af Amer: >60 mL/min (ref >60)
Glucose, Bld: 153 mg/dL — ABNORMAL HIGH (ref 70–99)
Potassium: 3.7 mmol/L (ref 3.5–5.1)
Sodium: 148 mmol/L — ABNORMAL HIGH (ref 135–145)
Total Bilirubin: 0.9 mg/dL (ref 0.3–1.2)
Total Protein: 5.6 g/dL — ABNORMAL LOW (ref 6.5–8.1)

## 2018-07-26 LAB — BASIC METABOLIC PANEL
Anion gap: 8 (ref 5–15)
BUN: 27 mg/dL — ABNORMAL HIGH (ref 8–23)
CHLORIDE: 114 mmol/L — AB (ref 98–111)
CO2: 25 mmol/L (ref 22–32)
Calcium: 8.4 mg/dL — ABNORMAL LOW (ref 8.9–10.3)
Creatinine, Ser: 0.72 mg/dL (ref 0.61–1.24)
GFR calc Af Amer: >60 mL/min (ref >60)
GFR calc non Af Amer: >60 mL/min (ref >60)
Glucose, Bld: 163 mg/dL — ABNORMAL HIGH (ref 70–99)
Potassium: 3.6 mmol/L (ref 3.5–5.1)
Sodium: 147 mmol/L — ABNORMAL HIGH (ref 135–145)

## 2018-07-26 LAB — CK: Total CK: 48 U/L — ABNORMAL LOW (ref 49–397)

## 2018-07-26 LAB — ECHOCARDIOGRAM COMPLETE
Height: 74 in
Weight: 3792 oz

## 2018-07-26 MED ORDER — GADOBUTROL 1 MMOL/ML IV SOLN
10.0000 mL | Freq: Once | INTRAVENOUS | Status: AC | PRN
  Administered 2018-07-26: 10 mL via INTRAVENOUS

## 2018-07-26 MED ORDER — SODIUM CHLORIDE 0.9 % IV SOLN
2.0000 g | Freq: Two times a day (BID) | INTRAVENOUS | Status: DC
  Administered 2018-07-26 – 2018-07-30 (×8): 2 g via INTRAVENOUS
  Filled 2018-07-26: qty 2
  Filled 2018-07-26: qty 20
  Filled 2018-07-26 (×2): qty 2
  Filled 2018-07-26: qty 20
  Filled 2018-07-26 (×3): qty 2
  Filled 2018-07-26: qty 20

## 2018-07-26 MED ORDER — PERFLUTREN LIPID MICROSPHERE
1.0000 mL | INTRAVENOUS | Status: AC | PRN
  Administered 2018-07-26: 5 mL via INTRAVENOUS
  Filled 2018-07-26: qty 10

## 2018-07-26 MED ORDER — KETOROLAC TROMETHAMINE 30 MG/ML IJ SOLN
30.0000 mg | Freq: Three times a day (TID) | INTRAMUSCULAR | Status: DC
  Administered 2018-07-26 – 2018-07-27 (×3): 30 mg via INTRAVENOUS
  Filled 2018-07-26 (×3): qty 1

## 2018-07-26 MED ORDER — SODIUM CHLORIDE 0.45 % IV SOLN
INTRAVENOUS | Status: DC
  Administered 2018-07-26 – 2018-07-31 (×6): via INTRAVENOUS

## 2018-07-26 NOTE — Progress Notes (Addendum)
  Echocardiogram 2D Echocardiogram has been performed with Definity.  Ricky Black 07/26/2018, 3:25 PM

## 2018-07-26 NOTE — Progress Notes (Signed)
[facility]    Date of Admission:  07/22/2018   Total days of antibiotics 4   ID: Ricky Black is a 78 y.o. male with   Principal Problem:   Sepsis ([place]) Active Problems:   Essential hypertension   Dementia ([place])   Acute encephalopathy   Acute lower UTI   Transaminitis    Subjective: Ongoing low grade fever of 100.3 in the last 24hr. Family at bedside. Has not really improved in the last 24hr still remains solomnent. Opens eyes to verbal command but not moving extremities or following command  Family reports he has not had energy nor moved his extremities since admit  Has new onset neck pain + nuchal rigidity  Medications:  . acetaminophen  650 mg Oral TID  . Chlorhexidine Gluconate Cloth  6 each Topical Q0600  . donepezil  10 mg Oral QHS  . escitalopram  20 mg Oral Daily  . finasteride  5 mg Oral Daily  . irbesartan  150 mg Oral Daily  . ketorolac  30 mg Intravenous Q8H  . memantine  10 mg Oral BID  . mupirocin ointment  1 application Nasal BID  . pregabalin  75 mg Oral TID  . tamsulosin  0.8 mg Oral QHS    Objective: Vital signs in last 24 hours: Temp:  [97.7 F (36.5 C)-100.3 F (37.9 C)] 98.1 F (36.7 C) (01/07 1530) Pulse Rate:  [82-91] 82 (01/07 1530) Resp:  [20-24] 20 (01/07 1530) BP: (136-152)/(68-73) 152/73 (01/07 1530) SpO2:  [94 %-96 %] 95 % (01/07 1530) Physical Exam  Constitutional: He is oriented to person,He appears well-developed and well-nourished. No distress.  HENT:  Mouth/Throat: Oropharynx is clear and moist. No oropharyngeal exudate.  Cardiovascular: Normal rate, regular rhythm and normal heart sounds. Exam reveals no gallop and no friction rub.  No murmur heard.  Pulmonary/Chest: Effort normal and breath sounds normal. No respiratory distress. He has no wheezes.  Abdominal: Soft. Bowel sounds are normal. He exhibits no distension. There is no tenderness.  Lymphadenopathy:  He has no cervical adenopathy.    Neurological: opens eyes to name, does not follow command. Skin: Skin is warm and dry. No rash noted. No erythema. Redness and swelling to dorsum of left hand. Roughly the same as yesterday   Lab Results Recent Labs    07/26/18 0533 07/26/18 0633  WBC 13.9* 14.4*  HGB 11.4* 11.0*  HCT 37.4* 36.1*  NA 148* 147*  K 3.7 3.6  CL 115* 114*  CO2 25 25  BUN 26* 27*  CREATININE 0.69 0.72   Liver Panel Recent Labs    07/26/18 0533  PROT 5.6*  ALBUMIN 1.7*  AST 72*  ALT 87*  ALKPHOS 95  BILITOT 0.9   Sedimentation Rate No results for input(s): ESRSEDRATE in the last 72 hours. C-Reactive Protein No results for input(s): CRP in the last 72 hours.  Microbiology: reviewed Studies/Results: Dg Neck Soft Tissue  Result Date: 07/26/2018 CLINICAL DATA:  Severe left sided neck pain worsening over last two days; limited range of motion EXAM: NECK SOFT TISSUES - 1+ VIEW COMPARISON:  Neck CT 07/01/2018 FINDINGS: Multilevel disc osteophytic disease with facet hypertrophy. Patient's head is tilted rightward. No acute findings in the cervical spine identified. No prevertebral soft tissue thickening. No abnormality in the oropharynx or hypopharynx identified. IMPRESSION: No acute findings identified by plain film radiography. Electronically Signed   By: Suzy Bouchard M.D.   On: 07/26/2018 14:17  Assessment/Plan: Disseminated MSSA bacteremia thought to be urinary source though patient has not improved since admit, now day 4 abtx  Agree with getting cervical spine mri and brain mri - unclear if he has septic emboli from bacteremia Await TTE findings Would get LP if remains encephalopathic  He has had MRI of thoracic and lumbar spine on 12/12 that did not show signs of infection though at  That time only treated for uti, not know to have bacteremia  Difficult to tell if his encephalopathy is caused directly by infection (CNS brain abscess/meningitis) vs the sequelae of having recent  hospitalization and being deconditioned  Recommend proper neuro exam to see if he has weakness. Only applied light noxious stimuli where he didn't move arms.   Will change abtx to ceftriaxone 2gm VI Q 12  No hx of cold sores or herpes to suggest hsv encephalitis.   [facility] for Infectious Diseases Cell: [phone] Pager: [phone]  07/26/2018, 6:07 PM

## 2018-07-26 NOTE — Consult Note (Signed)
Consultation Note Date: 07/26/2018   Patient Name: Ricky Black  DOB: 10-27-1940  MRN: [id number]  Age / Sex: 78 y.o., male  PCP: Marton Redwood, MD Referring Physician: Georgette Shell, MD  Reason for Consultation: Establishing goals of care, Non pain symptom management and Psychosocial/spiritual support  HPI/Patient Profile: 78 y.o. male  with past medical history of arthritis, bradycardia, neuropathy, and moderate dementia who was admitted on 07/22/2018 with sepsis with encephalopathy and MSSA bacteremia.  He was noted to be aspirating.  Palliative medicine has been asked to assist with goals of care.  Clinical Assessment and Goals of Care:  I have reviewed medical records including EPIC notes, labs and imaging, received report from the care team, assessed the patient and then met at the bedside along with wife and 2 daughters to discuss diagnosis prognosis, GOC, EOL wishes, disposition and options.  I introduced Palliative Medicine as specialized medical care for people living with serious illness. It focuses on providing relief from the symptoms and stress of a serious illness. The goal is to improve quality of life for both the patient and the family.  We discussed a brief life review of the patient.  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  When he was younger he thoroughly enjoyed running and ran 10 miles a day.  He is married to Weeki Wachee.  He has 2 daughters by previous marriage Larene Beach and Berino).  Both Inez Catalina and [place] are RNs.  Inez Catalina retired from [employer] [place].  Inez Catalina tells me that some of Pat's favorite things are riding down [address] going to [employer] for coffee, she mentions he served in [employer].  In the morning he usually reads the news paper over and over.  As far as functional and nutritional status Inez Catalina  tells me that he was doing well until December 11.  He rapidly declined within about 4 hours.  He went from being able to walk to being too ill to stand.  She relates the story of going to the ER 3 days in a row only to be turned away and told he had a urinary tract infection.  Currently he is lying in bed his extremities are flaccid he has severe pain in the back of his neck.  He is hot to touch and appears to have redness climbing up his arms.  The family is standby how quickly this is overcome them.  They are waiting for MRI and test results to explain this rapidly progressive's infection.  The family does note that since November the patient has dropped 27 pounds (from 2 57-230).  We discussed his current illness and what it means in the larger context of his on-going co-morbidities.  Natural disease trajectory and expectations at EOL were discussed.  Specifically we talked about the stairstep decline of dementia when a person has recurrent infections and hospitalization.  It is somewhat difficult to sort out how much of his  change in functional status is due to be infection versus how much is due to progressive dementia.  Inez Catalina mentioned that at home [name] took Norco up to 4 times a day, but he took it on a daily basis.  Running 10 miles a day for many years gave him significant arthritis and neuropathy.  I attempted to elicit values and goals of care important to the patient.  The difference between aggressive medical intervention and comfort care was considered in light of the patient's goals of care. Advanced directives, concepts specific to code status, artifical feeding and hydration, and rehospitalization were considered and discussed.  After much discussion the family felt that if [name] could speak for himself he would elect DNR.  He would not want to go through resuscitation at this point.  However, the family is very interested and reversing any reversible illness.  If necessary they are willing to  do a lumbar puncture.    Questions and concerns were addressed.  Hard Choices booklet left for review. The family was encouraged to call with questions or concerns.   Palliative medicine team will continue to follow with you.       Primary Decision Maker:  NEXT OF KIN wife Inez Catalina.  Inez Catalina ask in coordination with the patient's 2 daughters Larene Beach and Claiborne Billings    SUMMARY OF RECOMMENDATIONS     DNR/DNI, but otherwise full scope treatment for now.  Toradol scheduled for pain.  Patient took opioids [facility]) daily prior to admission - There may be an element of withdraw if they are completely eliminated.  PMT will continue to follow with you.  Code Status/Advance Care Planning:  Changed to DNR/DNI today.   Symptom Management:   Scheduled toradol for pain.    Ice PRN to neck    Palliative Prophylaxis:   Aspiration and Delirium Protocol  Psycho-social/Spiritual:   Desire for further Chaplaincy support: yes   Prognosis: Difficult to determine.  He is at high risk for an acute event that could result in death.  We will be able to provide a more accurate prognosis as he progresses through this hospitalization.    Discharge Planning: To Be Determined.  Hopefully SNF with palliative.      Primary Diagnoses: Present on Admission: . Sepsis ([place]) . Essential hypertension . Dementia ([place]) . Acute encephalopathy . Acute lower UTI . Transaminitis   I have reviewed the medical record, interviewed the patient and family, and examined the patient. The following aspects are pertinent.  Past Medical History:  Diagnosis Date  . Abnormal EKG    Prior tracing  with reading can't rule out old septal MI  . Bradycardia   . Dementia ([place])   . Dizziness   . Hypogonadism male   . Neuropathy    (R) Dorsal foot  . OAB (overactive bladder)   . Olecranon bursitis   . Orthostatic hypotension   . Spinal arthritis    ESI   Social History   Socioeconomic History  . Marital  status: Married    Spouse name: Not on file  . Number of children: Not on file  . Years of education: Not on file  . Highest education level: Not on file  Occupational History  . Not on file  Social Needs  . Financial resource strain: Not on file  . Food insecurity:    Worry: Not on file    Inability: Not on file  . Transportation needs:    Medical: Not on file    Non-medical:  Not on file  Tobacco Use  . Smoking status: Former Research scientist (life sciences)  . Smokeless tobacco: Never Used  Substance and Sexual Activity  . Alcohol use: No  . Drug use: Not on file  . Sexual activity: Not on file  Lifestyle  . Physical activity:    Days per week: Not on file    Minutes per session: Not on file  . Stress: Not on file  Relationships  . Social connections:    Talks on phone: Not on file    Gets together: Not on file    Attends religious service: Not on file    Active member of club or organization: Not on file    Attends meetings of clubs or organizations: Not on file    Relationship status: Not on file  Other Topics Concern  . Not on file  Social History Narrative  . Not on file   Family History  Family history unknown: Yes   Scheduled Meds: . acetaminophen  650 mg Oral TID  . Chlorhexidine Gluconate Cloth  6 each Topical Q0600  . donepezil  10 mg Oral QHS  . escitalopram  20 mg Oral Daily  . finasteride  5 mg Oral Daily  . irbesartan  150 mg Oral Daily  . ketorolac  30 mg Intravenous Q8H  . memantine  10 mg Oral BID  . mupirocin ointment  1 application Nasal BID  . pregabalin  75 mg Oral TID  . tamsulosin  0.8 mg Oral QHS   Continuous Infusions: . sodium chloride    .  ceFAZolin (ANCEF) IV Stopped (07/26/18 0651)   PRN Meds:.acetaminophen, ondansetron **OR** ondansetron (ZOFRAN) IV, senna-docusate No Known Allergies Review of Systems patient is unable to provide review of systems  Physical Exam  Well-developed male, lethargic, only able to utter 1 or 2 words at a whisper.   Cheeks are very flushed Skin is hot to touch Respiratory no increased work of breathing no wheezes crackles or rales Cardiovascular regular rate and rhythm without murmur rub or gallop Hands are swollen bilaterally with redness that extends from the dorsum of his hands up to his elbows. Abdomen soft, nontender, nondistended  Vital Signs: BP 136/73 (BP Location: Left Arm)   Pulse 87   Temp 97.7 F (36.5 C)   Resp 20   Ht '6\' 2"'$  (1.88 m)   Wt 107.5 kg   SpO2 94%   BMI 30.43 kg/m  Pain Scale: 0-10   Pain Score: Asleep   SpO2: SpO2: 94 % O2 Device:SpO2: 94 % O2 Flow Rate: .O2 Flow Rate (L/min): 2 L/min  IO: Intake/output summary:   Intake/Output Summary (Last 24 hours) at 07/26/2018 1245 Last data filed at 07/26/2018 0800 Gross per 24 hour  Intake 300 ml  Output 1525 ml  Net -1225 ml    LBM: Last BM Date: 07/25/18 Baseline Weight: Weight: 107.5 kg Most recent weight: Weight: 107.5 kg     Palliative Assessment/Data: 20%   Flowsheet Rows     Most Recent Value  Intake Tab  Referral Department  Hospitalist  Unit at Time of Referral  Med/Surg Unit  Palliative Care Primary Diagnosis  Sepsis/Infectious Disease  Date Notified  07/25/18  Palliative Care Type  New Palliative care  Reason for referral  Clarify Goals of Care  Date of Admission  07/22/18  # of days IP prior to Palliative referral  3  Clinical Assessment  Psychosocial & Spiritual Assessment  Palliative Care Outcomes      Time In:  9:00 Time Out: 10:10 a.m. labs Time Total: 70 Greater than 50%  of this time was spent counseling and coordinating care related to the above assessment and plan.  Signed by: Florentina Jenny, PA-C Palliative Medicine Pager: [phone]  Please contact Palliative Medicine Team phone at [phone] for questions and concerns.  For individual provider: See Shea Evans

## 2018-07-26 NOTE — Progress Notes (Signed)
Patient is drowsiness, unable to be awake enough to take medication. RN held PO medication; notified MD and family at bedside.

## 2018-07-26 NOTE — Progress Notes (Addendum)
PROGRESS NOTE    Ricky Black  IRC:[id number] DOB: 20-Jun-1941 DOA: 07/22/2018 PCP: Ricky Redwood, MD  Brief Narrative: 78 y.o.Mwith hx dementia, previously home dwelling, HTN, and 2 recent admissions for MSSA UTIwho presents with slow progressive weakness.  Caveat that patient is unable to provide any history, which is collected from wife at bedside instead.   The patient was discharged mid-December for UTI, MSSA, discharged on nitrofurantoin. He was seeming to do better for a time, ambulating short distances, but then in the last 1-2 weeks has started to decline again, no longer getting out of bed, weaker and weaker, less responsive until today he had a fever and was sent to the ER. There has been no cough or respiratory distress. There is been no obvious urinary symptoms. No vomiting or reported abdominal pain.  ED course: -Temp 101F, heart rate 91, respirations 31,blood pressure 130/79 -Na139, K4.0, Cr0.9, WBC13.3K, Hgb13 -Transaminases 60s, Tbili 1.4 -INR 1.1 -Lactic acid 1.7 -UA showed many RBCs and WBCs -CXR showed no pneumonia or effusion -CT head unremarkable He was given ceftriaxone and IV fluidsin the hospitalist service were asked to evaluate for admission  Assessment & Plan:   Principal Problem:   Sepsis (Ricky Black) Active Problems:   Essential hypertension   Dementia (Ricky Black)   Acute encephalopathy   Acute lower UTI   Transaminitis  MSSA bacteremia-urine culture positive for MSSA antibiotics changed to Ancef.  Patient had urinary retention over 300 cc of Foley catheter was placed.  Will most likely need to be discharged back to SNF with a Foley in place and have him follow-up with urologist.  Discussed with Dr. Jeffie Black.  Patient meets criteria given tachycardia, tachypnea, fever, leukocytosis, and evidence of organ dysfunction(encephalopathy, transaminitis). -CT renal protocol-no renal stones -ContinueFlomax and Proscar -Bladder scanshowed more than  300 cc of urine Foley catheter placed. Patient has recurrent UTI over last 1 month. -  Transaminitis -Check US abdomen-no stones, hepatic steatosis present. -Trend LFTs  Acutemetabolicencephalopathysecondary to sepsis cautious with Norco. Lorazepam stopped. CT head unremarkable. Monitor closely. consult speech therapy and physical therapy. His mental status has been progressively worsening with neck stiffness.will order MRI neck head and LP.need to rule out meningitis abscess. Patient and family to meet with palliative care this afternoon.  Patient with very poor oral intake, not alert enough to take p.o. at this time.  We will give him slow IV fluids.  Dementia -ContinueNamenda and donepezil -ContinueSSRI  Hyponatremia due to decreased p.o. intake will order IV fluids slowly.  Estimated body mass index is 30.43 kg/m as calculated from the following:   Height as of this encounter: 6\' 2"  (1.88 m).   Weight as of this encounter: 107.5 kg.  DVT prophylaxis: lovenox Code Status:full Family Communication:dw daughters  Disposition Plan: pending clinical    Consultants:  Id Neuro on phone Procedures:none Antimicrobials:ancef  Subjective: Patient appears very lethargic though he seems to hear what we are speaking to the family and he nods his head yes and no he has not had anything to eat or drink since yesterday  Objective: Vitals:   07/25/18 2231 07/26/18 0059 07/26/18 0613 07/26/18 0620  BP: 137/68  136/73   Pulse: 91  87   Resp: 20  (!) 24 20  Temp: 100.3 F (37.9 C) 99.9 F (37.7 C) 97.7 F (36.5 C)   TempSrc:  Oral    SpO2: 96%  94%   Weight:      Height:        Intake/Output  Summary (Last 24 hours) at 07/26/2018 1105 Last data filed at 07/26/2018 0800 Gross per 24 hour  Intake 300 ml  Output 1525 ml  Net -1225 ml   Filed Weights   07/22/18 1232  Weight: 107.5 kg    Examination:  General exam: Appears lethargic Respiratory system: Clear  to auscultation. Respiratory effort normal. Cardiovascular system: S1 & S2 heard, RRR. No JVD, murmurs, rubs, gallops or clicks. No pedal edema. Gastrointestinal system: Abdomen is nondistended, soft and nontender. No organomegaly or masses felt. Normal bowel sounds heard. Central nervous system: Not responding to commands grimaces when moving his neck even to slight touch on the neck makes him grimace Extremities: 1+ peripheral pitting edema right upper extremity with erythema over anterior aspect of the right wrist from a peripheral IV increased erythema noted Skin: No rashes, lesions or ulcers Psychiatry: Judgement and insight appear normal. Mood & affect appropriate.     Data Reviewed: I have personally reviewed following labs and imaging studies  CBC: Recent Labs  Lab 07/22/18 1005 07/23/18 0658 07/26/18 0533 07/26/18 0633  WBC 13.3* 11.8* 13.9* 14.4*  NEUTROABS 12.3*  --   --   --   HGB 13.0 11.4* 11.4* 11.0*  HCT 40.3 36.3* 37.4* 36.1*  MCV 103.6* 104.6* 107.8* 108.4*  PLT 201 180 233 211   Basic Metabolic Panel: Recent Labs  Lab 07/22/18 1005 07/23/18 0658 07/26/18 0533 07/26/18 0633  NA 139 144 148* 147*  K 4.0 3.6 3.7 3.6  CL 100 110 115* 114*  CO2 24 23 25 25   GLUCOSE 190* 194* 153* 163*  BUN 46* 43* 26* 27*  CREATININE 0.94 0.81 0.69 0.72  CALCIUM 9.0 8.3* 8.4* 8.4*   GFR: Estimated Creatinine Clearance: 97.7 mL/min (by C-G formula based on SCr of 0.72 mg/dL). Liver Function Tests: Recent Labs  Lab 07/22/18 1005 07/23/18 0658 07/26/18 0533  AST 63* 71* 72*  ALT 63* 64* 87*  ALKPHOS 107 83 95  BILITOT 1.4* 0.5 0.9  PROT 7.3 6.3* 5.6*  ALBUMIN 2.7* 2.1* 1.7*   No results for input(s): LIPASE, AMYLASE in the last 168 hours. No results for input(s): AMMONIA in the last 168 hours. Coagulation Profile: Recent Labs  Lab 07/22/18 1005  INR 1.19   Cardiac Enzymes: Recent Labs  Lab 07/26/18 0533  CKTOTAL 48*   BNP (last 3 results) No results  for input(s): PROBNP in the last 8760 hours. HbA1C: No results for input(s): HGBA1C in the last 72 hours. CBG: No results for input(s): GLUCAP in the last 168 hours. Lipid Profile: No results for input(s): CHOL, HDL, LDLCALC, TRIG, CHOLHDL, LDLDIRECT in the last 72 hours. Thyroid Function Tests: Recent Labs    07/25/18 1502  TSH 1.418   Anemia Panel: No results for input(s): VITAMINB12, FOLATE, FERRITIN, TIBC, IRON, RETICCTPCT in the last 72 hours. Sepsis Labs: Recent Labs  Lab 07/22/18 1012 07/22/18 1155  LATICACIDVEN 1.72 1.35    Recent Results (from the past 240 hour(s))  Culture, blood (Routine x 2)     Status: Abnormal   Collection Time: 07/22/18 10:05 AM  Result Value Ref Range Status   Specimen Description BLOOD BLOOD RIGHT FOREARM  Final   Special Requests   Final    BOTTLES DRAWN AEROBIC AND ANAEROBIC Blood Culture adequate volume Performed at [facility] [address] [place], [place] [address]    Culture  Setup Time   Final    IN BOTH AEROBIC AND ANAEROBIC BOTTLES GRAM POSITIVE COCCI CRITICAL  RESULT CALLED TO, READ BACK BY AND VERIFIED WITHLavell Luster PHARMD [id number] 07/23/18 A BROWNING    Culture STAPHYLOCOCCUS AUREUS (A)  Final   Report Status 07/25/2018 FINAL  Final   Organism ID, Bacteria STAPHYLOCOCCUS AUREUS  Final      Susceptibility   Staphylococcus aureus - MIC*    CIPROFLOXACIN <=0.5 SENSITIVE Sensitive     ERYTHROMYCIN <=0.25 SENSITIVE Sensitive     GENTAMICIN <=0.5 SENSITIVE Sensitive     OXACILLIN <=0.25 SENSITIVE Sensitive     TETRACYCLINE <=1 SENSITIVE Sensitive     VANCOMYCIN <=0.5 SENSITIVE Sensitive     TRIMETH/SULFA 160 RESISTANT Resistant     CLINDAMYCIN <=0.25 SENSITIVE Sensitive     RIFAMPIN <=0.5 SENSITIVE Sensitive     Inducible Clindamycin NEGATIVE Sensitive     * STAPHYLOCOCCUS AUREUS  Blood Culture ID Panel (Reflexed)     Status: Abnormal   Collection Time: 07/22/18 10:05 AM  Result Value Ref Range Status    Enterococcus species NOT DETECTED NOT DETECTED Final   Listeria monocytogenes NOT DETECTED NOT DETECTED Final   Staphylococcus species DETECTED (A) NOT DETECTED Final    Comment: CRITICAL RESULT CALLED TO, READ BACK BY AND VERIFIED WITHLavell Luster PHARMD 0350 07/23/18 A BROWNING    Staphylococcus aureus (BCID) DETECTED (A) NOT DETECTED Final    Comment: Methicillin (oxacillin) susceptible Staphylococcus aureus (MSSA). Preferred therapy is anti staphylococcal beta lactam antibiotic (Cefazolin or Nafcillin), unless clinically contraindicated. CRITICAL RESULT CALLED TO, READ BACK BY AND VERIFIED WITHLavell Luster PHARMD 0938 07/23/18 A BROWNING    Methicillin resistance NOT DETECTED NOT DETECTED Final   Streptococcus species NOT DETECTED NOT DETECTED Final   Streptococcus agalactiae NOT DETECTED NOT DETECTED Final   Streptococcus pneumoniae NOT DETECTED NOT DETECTED Final   Streptococcus pyogenes NOT DETECTED NOT DETECTED Final   Acinetobacter baumannii NOT DETECTED NOT DETECTED Final   Enterobacteriaceae species NOT DETECTED NOT DETECTED Final   Enterobacter cloacae complex NOT DETECTED NOT DETECTED Final   Escherichia coli NOT DETECTED NOT DETECTED Final   Klebsiella oxytoca NOT DETECTED NOT DETECTED Final   Klebsiella pneumoniae NOT DETECTED NOT DETECTED Final   Proteus species NOT DETECTED NOT DETECTED Final   Serratia marcescens NOT DETECTED NOT DETECTED Final   Haemophilus influenzae NOT DETECTED NOT DETECTED Final   Neisseria meningitidis NOT DETECTED NOT DETECTED Final   Pseudomonas aeruginosa NOT DETECTED NOT DETECTED Final   Candida albicans NOT DETECTED NOT DETECTED Final   Candida glabrata NOT DETECTED NOT DETECTED Final   Candida krusei NOT DETECTED NOT DETECTED Final   Candida parapsilosis NOT DETECTED NOT DETECTED Final   Candida tropicalis NOT DETECTED NOT DETECTED Final    Comment: Performed at [facility], [place] [address], [place], [place] [address]  Culture, blood  (Routine x 2)     Status: Abnormal   Collection Time: 07/22/18 11:45 AM  Result Value Ref Range Status   Specimen Description   Final    BLOOD LEFT ANTECUBITAL Performed at [facility] [address], [place], [place] [address]    Special Requests   Final    BOTTLES DRAWN AEROBIC AND ANAEROBIC Blood Culture adequate volume Performed at [facility] [address], [place], [place] [address]    Culture  Setup Time   Final    GRAM POSITIVE COCCI IN BOTH AEROBIC AND ANAEROBIC BOTTLES CRITICAL VALUE NOTED.  VALUE IS CONSISTENT WITH PREVIOUSLY REPORTED AND CALLED VALUE. Performed at [facility],  [address], [place], [place] [address]    Culture (A)  Final    STAPHYLOCOCCUS AUREUS SUSCEPTIBILITIES PERFORMED ON PREVIOUS CULTURE WITHIN THE LAST 5 DAYS.    Report Status 07/25/2018 FINAL  Final  Urine culture     Status: Abnormal   Collection Time: 07/22/18 12:36 PM  Result Value Ref Range Status   Specimen Description   Final    URINE, CATHETERIZED Performed at [facility], [place] [address] [place], [place] [address]    Special Requests   Final    Normal Performed at [facility], [place] [address] [place], [place] [address]    Culture >=100,000 COLONIES/mL STAPHYLOCOCCUS AUREUS (A)  Final   Report Status 07/24/2018 FINAL  Final   Organism ID, Bacteria STAPHYLOCOCCUS AUREUS (A)  Final      Susceptibility   Staphylococcus aureus - MIC*    CIPROFLOXACIN <=0.5 SENSITIVE Sensitive     GENTAMICIN <=0.5 SENSITIVE Sensitive     NITROFURANTOIN <=16 SENSITIVE Sensitive     OXACILLIN <=0.25 SENSITIVE Sensitive     TETRACYCLINE <=1 SENSITIVE Sensitive     VANCOMYCIN <=0.5 SENSITIVE Sensitive     TRIMETH/SULFA 80 RESISTANT Resistant     CLINDAMYCIN <=0.25 SENSITIVE Sensitive     RIFAMPIN <=0.5 SENSITIVE Sensitive     Inducible Clindamycin NEGATIVE Sensitive     * >=100,000 COLONIES/mL STAPHYLOCOCCUS  AUREUS  MRSA PCR Screening     Status: Abnormal   Collection Time: 07/23/18  2:03 AM  Result Value Ref Range Status   MRSA by PCR POSITIVE (A) NEGATIVE Final    Comment:        The GeneXpert MRSA Assay (FDA approved for NASAL specimens only), is one component of a comprehensive MRSA colonization surveillance program. It is not intended to diagnose MRSA infection nor to guide or monitor treatment for MRSA infections. RESULT CALLED TO, READ BACK BY AND VERIFIED WITH: HILL,D RN @0353  ON 07/23/18 JACKSON,K Performed at [facility], [place] [address], [place], [place] [address]   Culture, blood (routine x 2)     Status: None (Preliminary result)   Collection Time: 07/25/18  5:04 PM  Result Value Ref Range Status   Specimen Description   Final    BLOOD LEFT HAND Performed at [facility] [address], [place], [place] [address]    Special Requests   Final    BOTTLES DRAWN AEROBIC AND ANAEROBIC Blood Culture adequate volume Performed at [facility] [address] [place], [place] [address]    Culture   Final    NO GROWTH < 12 HOURS Performed at [facility] [address], [place], [place] [address]    Report Status PENDING  Incomplete  Culture, blood (routine x 2)     Status: None (Preliminary result)   Collection Time: 07/25/18  5:21 PM  Result Value Ref Range Status   Specimen Description   Final    BLOOD LEFT HAND Performed at [facility] [address], [place], [place] [address]    Special Requests   Final    BOTTLES DRAWN AEROBIC AND ANAEROBIC Blood Culture adequate volume Performed at [facility] [address], [place], [place] [address]    Culture   Final    NO GROWTH < 12 HOURS Performed at [facility] [address], [place], [place] [address]    Report Status PENDING  Incomplete  Radiology Studies: Dg Chest 1 View  Result Date:  07/24/2018 CLINICAL DATA:  Shortness of breath and weakness. EXAM: CHEST  1 VIEW COMPARISON:  07/22/2018 FINDINGS: Lungs are hypoinflated with mild bibasilar opacification which may be due to atelectasis or infection. Cardiomediastinal silhouette and remainder the exam is unchanged. IMPRESSION: Bibasilar opacification which may be due to atelectasis or infection. Electronically Signed   By: Marin Olp M.D.   On: 07/24/2018 12:34        Scheduled Meds: . acetaminophen  650 mg Oral TID  . Chlorhexidine Gluconate Cloth  6 each Topical Q0600  . donepezil  10 mg Oral QHS  . enoxaparin (LOVENOX) injection  40 mg Subcutaneous Q24H  . escitalopram  20 mg Oral Daily  . finasteride  5 mg Oral Daily  . irbesartan  150 mg Oral Daily  . memantine  10 mg Oral BID  . mupirocin ointment  1 application Nasal BID  . pregabalin  75 mg Oral TID  . tamsulosin  0.8 mg Oral QHS   Continuous Infusions: .  ceFAZolin (ANCEF) IV Stopped (07/26/18 5993)     LOS: 4 days     Georgette Shell, MD [facility]  If 7PM-7AM, please contact night-coverage [email or web address] Password [email or web address] 07/26/2018, 11:05 AM

## 2018-07-27 ENCOUNTER — Inpatient Hospital Stay (HOSPITAL_COMMUNITY): Payer: Medicare Other

## 2018-07-27 DIAGNOSIS — G8250 Quadriplegia, unspecified: Secondary | ICD-10-CM

## 2018-07-27 DIAGNOSIS — F0150 Vascular dementia without behavioral disturbance: Secondary | ICD-10-CM

## 2018-07-27 LAB — HEPATIC FUNCTION PANEL
ALT: 61 U/L — ABNORMAL HIGH (ref 0–44)
AST: 66 U/L — ABNORMAL HIGH (ref 15–41)
Albumin: 1.8 g/dL — ABNORMAL LOW (ref 3.5–5.0)
Alkaline Phosphatase: 78 U/L (ref 38–126)
Bilirubin, Direct: 0.2 mg/dL (ref 0.0–0.2)
Indirect Bilirubin: 0.4 mg/dL (ref 0.3–0.9)
Total Bilirubin: 0.6 mg/dL (ref 0.3–1.2)
Total Protein: 5.5 g/dL — ABNORMAL LOW (ref 6.5–8.1)

## 2018-07-27 LAB — CBC
HCT: 35.2 % — ABNORMAL LOW (ref 39.0–52.0)
Hemoglobin: 10.6 g/dL — ABNORMAL LOW (ref 13.0–17.0)
MCH: 33.1 pg (ref 26.0–34.0)
MCHC: 30.1 g/dL (ref 30.0–36.0)
MCV: 110 fL — ABNORMAL HIGH (ref 80.0–100.0)
PLATELETS: 219 10*3/uL (ref 150–400)
RBC: 3.2 MIL/uL — ABNORMAL LOW (ref 4.22–5.81)
RDW: 13.4 % (ref 11.5–15.5)
WBC: 12.6 10*3/uL — ABNORMAL HIGH (ref 4.0–10.5)
nRBC: 0 % (ref 0.0–0.2)

## 2018-07-27 LAB — BASIC METABOLIC PANEL
Anion gap: 8 (ref 5–15)
BUN: 40 mg/dL — ABNORMAL HIGH (ref 8–23)
CO2: 24 mmol/L (ref 22–32)
Calcium: 8.1 mg/dL — ABNORMAL LOW (ref 8.9–10.3)
Chloride: 113 mmol/L — ABNORMAL HIGH (ref 98–111)
Creatinine, Ser: 0.75 mg/dL (ref 0.61–1.24)
GFR calc Af Amer: >60 mL/min (ref >60)
Glucose, Bld: 221 mg/dL — ABNORMAL HIGH (ref 70–99)
Potassium: 3.6 mmol/L (ref 3.5–5.1)
Sodium: 145 mmol/L (ref 135–145)

## 2018-07-27 LAB — CSF CELL COUNT WITH DIFFERENTIAL
RBC Count, CSF: UNDETERMINED /mm3
Tube #: 1
WBC, CSF: UNDETERMINED /mm3 (ref 0–5)

## 2018-07-27 LAB — PROTEIN, CSF: Total  Protein, CSF: >600 mg/dL — ABNORMAL HIGH (ref 15–45)

## 2018-07-27 MED ORDER — GADOBUTROL 1 MMOL/ML IV SOLN
10.0000 mL | Freq: Once | INTRAVENOUS | Status: AC | PRN
  Administered 2018-07-27: 10 mL via INTRAVENOUS

## 2018-07-27 MED ORDER — KETOROLAC TROMETHAMINE 30 MG/ML IJ SOLN
30.0000 mg | Freq: Four times a day (QID) | INTRAMUSCULAR | Status: DC | PRN
  Administered 2018-07-27 – 2018-07-31 (×5): 30 mg via INTRAVENOUS
  Filled 2018-07-27 (×6): qty 1

## 2018-07-27 MED ORDER — ZINC OXIDE 40 % EX OINT
TOPICAL_OINTMENT | Freq: Four times a day (QID) | CUTANEOUS | Status: DC
  Administered 2018-07-27 – 2018-07-30 (×11): via TOPICAL
  Filled 2018-07-27: qty 57

## 2018-07-27 NOTE — Progress Notes (Addendum)
PROGRESS NOTE    Ricky Black  QMV:[id number] DOB: 06/26/1941 DOA: 07/22/2018 PCP: Marton Redwood, MD   Brief Narrative:77 y.o.Mwith hx dementia, previously home dwelling, HTN, and 2 recent admissions for MSSA UTIwho presents with slow progressive weakness.  Caveat that patient is unable to provide any history, which is collected from wife at bedside instead.   The patient was discharged mid-December for UTI, MSSA, discharged on nitrofurantoin. He was seeming to do better for a time, ambulating short distances, but then in the last 1-2 weeks has started to decline again, no longer getting out of bed, weaker and weaker, less responsive until today he had a fever and was sent to the ER. There has been no cough or respiratory distress. There is been no obvious urinary symptoms. No vomiting or reported abdominal pain. ED course: -Temp 101F, heart rate 91, respirations 31,blood pressure 130/79 -Na139, K4.0, Cr0.9, WBC13.3K, Hgb13 -Transaminases 60s, Tbili 1.4 -INR 1.1 -Lactic acid 1.7 -UA showed many RBCs and WBCs -CXR showed no pneumonia or effusion -CT head unremarkable He was given ceftriaxone and IV fluidsin the hospitalist service were asked to evaluate for admission  Assessment & Plan:   Principal Problem:   Sepsis ([place]) Active Problems:   Essential hypertension   Dementia ([place])   Acute encephalopathy   Acute lower UTI   Transaminitis   Neck pain   DNR (do not resuscitate)   Palliative care encounter   MSSA bacteremia-urine culture positive for MSSA .-Infectious disease following.  Repeat blood cultures done.  Will order for PICC line tomorrow.  Continue Rocephin.patient had urinary retention over 300 cc of Foley catheter was placed. Will most likely need to be discharged back to SNF with a Foley in place and have him follow-up with urologist. Discussed with Dr. Jeffie Pollock.  At the time of admission patient had a CT renal protocol which showed no renal stones.   Continue Flomax and Proscar. Transthoracic echo showed no vegetations a good ejection fraction no wall motion abnormalities and grade 1 diastolic dysfunction.  Transaminitis -US abdomen-no stones, hepatic steatosis present. -Trend LFTs  Acutemetabolicencephalopathysecondary to sepsis cautious with Norco. Lorazepam stopped. CT head unremarkable.  MRI of the brain showed no acute changes other than generalized atrophy.  MRA of the neck showed nothing acute.  MRI of the  cervical spine  ordered as patient is not moving his upper or lower extremities.  On exam he is very flaccid up in upper and lower extremities with 0 x 5 power and diminished reflexes.  However he was more awake today than yesterday and more conversation today and had breakfast this morning which is an improvement.  Speech therapy consulted recommends MBS tomorrow high aspiration risk.  Followed by palliative care.  CODE STATUS changed to DNR yesterday.    Patient with very poor oral intake, not alert enough to take p.o. at this time.  We will give him slow IV fluids.    Dementia -ContinueNamenda and donepezil -ContinueSSRI  Hypernatremia due to decreased p.o. intake will order IV fluids slowly.   Estimated body mass index is 30.43 kg/m as calculated from the following:   Height as of this encounter: 6\' 2"  (1.88 m).   Weight as of this encounter: 107.5 kg.  DVT prophylaxis: Lovenox Code Status DO NOT RESUSCITATE Family Communication: Discussed with daughter Disposition Plan: Pending clinical improvement  Consultants: Infectious disease neuro  Neuro over the phone  Procedures: None Antimicrobials: Rocephin  Subjective: Much more awake when I asked him what is  the name of his wife he was able to tell me the name of his wife he knew he was in [place] family reports that he had  breakfast but still continues to have generalized weakness though neck pain seems to be better than yesterday not getting out  of bed not moving his upper or lower extremities   Objective: Vitals:   07/26/18 0620 07/26/18 1530 07/26/18 2202 07/27/18 0546  BP:  (!) 152/73 (!) 146/80 (!) 148/78  Pulse:  82 77 86  Resp: 20 20 20  (!) 24  Temp:  98.1 F (36.7 C) (!) 97.2 F (36.2 C) 99.8 F (37.7 C)  TempSrc:  Axillary    SpO2:  95% 95% 96%  Weight:      Height:        Intake/Output Summary (Last 24 hours) at 07/27/2018 1121 Last data filed at 07/27/2018 1116 Gross per 24 hour  Intake 323.35 ml  Output 800 ml  Net -476.65 ml   Filed Weights   07/22/18 1232  Weight: 107.5 kg    Examination: Foley catheter in place  General exam: Appears calm and comfortable  Respiratory system: Clear to auscultation. Respiratory effort normal. Cardiovascular system: S1 & S2 heard, RRR. No JVD, murmurs, rubs, gallops or clicks. No pedal edema. Gastrointestinal system: Abdomen is nondistended, soft and nontender. No organomegaly or masses felt. Normal bowel sounds heard. Central nervous system: Awake he knows he is in [place] but do not know where in [place].  Flaccid upper and lower extremity 0 x 5 diminished reflexes Extremities: Trace bilateral lower extremity edema right upper extremity erythema noted but decreased.. Skin: No rashes, lesions or ulcers Psychiatry: Judgement and insight appear normal. Mood & affect appropriate.     Data Reviewed: I have personally reviewed following labs and imaging studies  CBC: Recent Labs  Lab 07/22/18 1005 07/23/18 0658 07/26/18 0533 07/26/18 0633 07/27/18 1009  WBC 13.3* 11.8* 13.9* 14.4* 12.6*  NEUTROABS 12.3*  --   --   --   --   HGB 13.0 11.4* 11.4* 11.0* 10.6*  HCT 40.3 36.3* 37.4* 36.1* 35.2*  MCV 103.6* 104.6* 107.8* 108.4* 110.0*  PLT 201 180 233 220 144   Basic Metabolic Panel: Recent Labs  Lab 07/22/18 1005 07/23/18 0658 07/26/18 0533 07/26/18 0633 07/27/18 1009  NA 139 144 148* 147* 145  K 4.0 3.6 3.7 3.6 3.6  CL 100 110 115* 114* 113*  CO2  24 23 25 25 24   GLUCOSE 190* 194* 153* 163* 221*  BUN 46* 43* 26* 27* 40*  CREATININE 0.94 0.81 0.69 0.72 0.75  CALCIUM 9.0 8.3* 8.4* 8.4* 8.1*   GFR: Estimated Creatinine Clearance: 97.7 mL/min (by C-G formula based on SCr of 0.75 mg/dL). Liver Function Tests: Recent Labs  Lab 07/22/18 1005 07/23/18 0658 07/26/18 0533  AST 63* 71* 72*  ALT 63* 64* 87*  ALKPHOS 107 83 95  BILITOT 1.4* 0.5 0.9  PROT 7.3 6.3* 5.6*  ALBUMIN 2.7* 2.1* 1.7*   No results for input(s): LIPASE, AMYLASE in the last 168 hours. No results for input(s): AMMONIA in the last 168 hours. Coagulation Profile: Recent Labs  Lab 07/22/18 1005  INR 1.19   Cardiac Enzymes: Recent Labs  Lab 07/26/18 0533  CKTOTAL 48*   BNP (last 3 results) No results for input(s): PROBNP in the last 8760 hours. HbA1C: No results for input(s): HGBA1C in the last 72 hours. CBG: No results for input(s): GLUCAP in the last 168 hours. Lipid Profile: No results  for input(s): CHOL, HDL, LDLCALC, TRIG, CHOLHDL, LDLDIRECT in the last 72 hours. Thyroid Function Tests: Recent Labs    07/25/18 1502  TSH 1.418   Anemia Panel: No results for input(s): VITAMINB12, FOLATE, FERRITIN, TIBC, IRON, RETICCTPCT in the last 72 hours. Sepsis Labs: Recent Labs  Lab 07/22/18 1012 07/22/18 1155  LATICACIDVEN 1.72 1.35    Recent Results (from the past 240 hour(s))  Culture, blood (Routine x 2)     Status: Abnormal   Collection Time: 07/22/18 10:05 AM  Result Value Ref Range Status   Specimen Description BLOOD BLOOD RIGHT FOREARM  Final   Special Requests   Final    BOTTLES DRAWN AEROBIC AND ANAEROBIC Blood Culture adequate volume Performed at [facility] [address] [place], [place] [address]    Culture  Setup Time   Final    IN BOTH AEROBIC AND ANAEROBIC BOTTLES GRAM POSITIVE COCCI CRITICAL RESULT CALLED TO, READ BACK BY AND VERIFIED WITHLavell Luster PHARMD [id number] 07/23/18 A BROWNING    Culture  STAPHYLOCOCCUS AUREUS (A)  Final   Report Status 07/25/2018 FINAL  Final   Organism ID, Bacteria STAPHYLOCOCCUS AUREUS  Final      Susceptibility   Staphylococcus aureus - MIC*    CIPROFLOXACIN <=0.5 SENSITIVE Sensitive     ERYTHROMYCIN <=0.25 SENSITIVE Sensitive     GENTAMICIN <=0.5 SENSITIVE Sensitive     OXACILLIN <=0.25 SENSITIVE Sensitive     TETRACYCLINE <=1 SENSITIVE Sensitive     VANCOMYCIN <=0.5 SENSITIVE Sensitive     TRIMETH/SULFA 160 RESISTANT Resistant     CLINDAMYCIN <=0.25 SENSITIVE Sensitive     RIFAMPIN <=0.5 SENSITIVE Sensitive     Inducible Clindamycin NEGATIVE Sensitive     * STAPHYLOCOCCUS AUREUS  Blood Culture ID Panel (Reflexed)     Status: Abnormal   Collection Time: 07/22/18 10:05 AM  Result Value Ref Range Status   Enterococcus species NOT DETECTED NOT DETECTED Final   Listeria monocytogenes NOT DETECTED NOT DETECTED Final   Staphylococcus species DETECTED (A) NOT DETECTED Final    Comment: CRITICAL RESULT CALLED TO, READ BACK BY AND VERIFIED WITHLavell Luster PHARMD [id number] 07/23/18 A BROWNING    Staphylococcus aureus (BCID) DETECTED (A) NOT DETECTED Final    Comment: Methicillin (oxacillin) susceptible Staphylococcus aureus (MSSA). Preferred therapy is anti staphylococcal beta lactam antibiotic (Cefazolin or Nafcillin), unless clinically contraindicated. CRITICAL RESULT CALLED TO, READ BACK BY AND VERIFIED WITHLavell Luster PHARMD [id number] 07/23/18 A BROWNING    Methicillin resistance NOT DETECTED NOT DETECTED Final   Streptococcus species NOT DETECTED NOT DETECTED Final   Streptococcus agalactiae NOT DETECTED NOT DETECTED Final   Streptococcus pneumoniae NOT DETECTED NOT DETECTED Final   Streptococcus pyogenes NOT DETECTED NOT DETECTED Final   Acinetobacter baumannii NOT DETECTED NOT DETECTED Final   Enterobacteriaceae species NOT DETECTED NOT DETECTED Final   Enterobacter cloacae complex NOT DETECTED NOT DETECTED Final   Escherichia coli NOT DETECTED NOT  DETECTED Final   Klebsiella oxytoca NOT DETECTED NOT DETECTED Final   Klebsiella pneumoniae NOT DETECTED NOT DETECTED Final   Proteus species NOT DETECTED NOT DETECTED Final   Serratia marcescens NOT DETECTED NOT DETECTED Final   Haemophilus influenzae NOT DETECTED NOT DETECTED Final   Neisseria meningitidis NOT DETECTED NOT DETECTED Final   Pseudomonas aeruginosa NOT DETECTED NOT DETECTED Final   Candida albicans NOT DETECTED NOT DETECTED Final   Candida glabrata NOT DETECTED NOT DETECTED Final   Candida krusei NOT DETECTED NOT DETECTED Final  Candida parapsilosis NOT DETECTED NOT DETECTED Final   Candida tropicalis NOT DETECTED NOT DETECTED Final    Comment: Performed at [facility], [place] [address], [place], [place] [address]  Culture, blood (Routine x 2)     Status: Abnormal   Collection Time: 07/22/18 11:45 AM  Result Value Ref Range Status   Specimen Description   Final    BLOOD LEFT ANTECUBITAL Performed at [facility] [address], [place], [place] [address]    Special Requests   Final    BOTTLES DRAWN AEROBIC AND ANAEROBIC Blood Culture adequate volume Performed at [facility] [address], [place], [place] [address]    Culture  Setup Time   Final    GRAM POSITIVE COCCI IN BOTH AEROBIC AND ANAEROBIC BOTTLES CRITICAL VALUE NOTED.  VALUE IS CONSISTENT WITH PREVIOUSLY REPORTED AND CALLED VALUE. Performed at [facility], [place] [address] [place], [place] [address]    Culture (A)  Final    STAPHYLOCOCCUS AUREUS SUSCEPTIBILITIES PERFORMED ON PREVIOUS CULTURE WITHIN THE LAST 5 DAYS.    Report Status 07/25/2018 FINAL  Final  Urine culture     Status: Abnormal   Collection Time: 07/22/18 12:36 PM  Result Value Ref Range Status   Specimen Description   Final    URINE, CATHETERIZED Performed at [facility], [place] [address], [place], [place] [address]    Special Requests   Final    Normal Performed  at [facility], [place] [address] [place], [place] [address]    Culture >=100,000 COLONIES/mL STAPHYLOCOCCUS AUREUS (A)  Final   Report Status 07/24/2018 FINAL  Final   Organism ID, Bacteria STAPHYLOCOCCUS AUREUS (A)  Final      Susceptibility   Staphylococcus aureus - MIC*    CIPROFLOXACIN <=0.5 SENSITIVE Sensitive     GENTAMICIN <=0.5 SENSITIVE Sensitive     NITROFURANTOIN <=16 SENSITIVE Sensitive     OXACILLIN <=0.25 SENSITIVE Sensitive     TETRACYCLINE <=1 SENSITIVE Sensitive     VANCOMYCIN <=0.5 SENSITIVE Sensitive     TRIMETH/SULFA 80 RESISTANT Resistant     CLINDAMYCIN <=0.25 SENSITIVE Sensitive     RIFAMPIN <=0.5 SENSITIVE Sensitive     Inducible Clindamycin NEGATIVE Sensitive     * >=100,000 COLONIES/mL STAPHYLOCOCCUS AUREUS  MRSA PCR Screening     Status: Abnormal   Collection Time: 07/23/18  2:03 AM  Result Value Ref Range Status   MRSA by PCR POSITIVE (A) NEGATIVE Final    Comment:        The GeneXpert MRSA Assay (FDA approved for NASAL specimens only), is one component of a comprehensive MRSA colonization surveillance program. It is not intended to diagnose MRSA infection nor to guide or monitor treatment for MRSA infections. RESULT CALLED TO, READ BACK BY AND VERIFIED WITH: HILL,D RN @0353  ON 07/23/18 JACKSON,K Performed at [facility], [place] [address] [place], [place] [address]   Culture, blood (routine x 2)     Status: None (Preliminary result)   Collection Time: 07/25/18  5:04 PM  Result Value Ref Range Status   Specimen Description   Final    BLOOD LEFT HAND Performed at [facility] [address] [place], [place] [address]    Special Requests   Final    BOTTLES DRAWN AEROBIC AND ANAEROBIC Blood Culture adequate volume Performed at [facility] [address], [place], [place] [address]    Culture  Final    NO GROWTH 2 DAYS Performed at [facility], [place] [address], [place], [place] [address]    Report Status PENDING  Incomplete  Culture, blood (routine x 2)     Status: None (Preliminary result)   Collection Time: 07/25/18  5:21 PM  Result Value Ref Range Status   Specimen Description   Final    BLOOD LEFT HAND Performed at [facility] [address], [place], [place] [address]    Special Requests   Final    BOTTLES DRAWN AEROBIC AND ANAEROBIC Blood Culture adequate volume Performed at [facility] [address], [place], [place] [address]    Culture   Final    NO GROWTH 2 DAYS Performed at [facility] [address] [place], [place] [address]    Report Status PENDING  Incomplete         Radiology Studies: Dg Neck Soft Tissue  Result Date: 07/26/2018 CLINICAL DATA:  Severe left sided neck pain worsening over last two days; limited range of motion EXAM: NECK SOFT TISSUES - 1+ VIEW COMPARISON:  Neck CT 07/01/2018 FINDINGS: Multilevel disc osteophytic disease with facet hypertrophy. Patient's head is tilted rightward. No acute findings in the cervical spine identified. No prevertebral soft tissue thickening. No abnormality in the oropharynx or hypopharynx identified. IMPRESSION: No acute findings identified by plain film radiography. Electronically Signed   By: Suzy Bouchard M.D.   On: 07/26/2018 14:17   Mr Jodene Nam Neck W Wo Contrast  Result Date: 07/26/2018 CLINICAL DATA:  Initial evaluation for acute unexplained altered mental status, slowly progressive weakness, neck stiffness. EXAM: MRI HEAD WITHOUT AND WITH CONTRAST MRA NECK WITHOUT AND WITH CONTRAST TECHNIQUE: Multiplanar, multiecho pulse sequences of the brain and surrounding structures were obtained without and with intravenous contrast. Angiographic images of the neck were obtained using MRA technique without and with intravenous contrast. Carotid stenosis measurements (when applicable) are obtained utilizing NASCET criteria, using the distal  internal carotid diameter as the denominator. CONTRAST:  10 cc of Gadavist. COMPARISON:  None available. FINDINGS: MRI HEAD FINDINGS Diffuse prominence of the CSF containing spaces compatible generalized age-related cerebral atrophy. Mild chronic microvascular changes present within the periventricular white matter. No abnormal foci of restricted diffusion to suggest acute or subacute ischemia. Gray-white matter differentiation maintained. No encephalomalacia to suggest chronic infarction. No foci of susceptibility artifact to suggest acute or chronic intracranial hemorrhage. No mass lesion, midline shift or mass effect. No hydrocephalus. No extra-axial fluid collection. Pituitary gland within normal limits. Midline structures intact and normal. No abnormal enhancement. No findings to suggest meningitis or intracranial infection. Hand healed and hand Major intracranial vascular flow voids are well maintained. Craniocervical junction within normal limits. Degenerative spondylolysis at C3-4 with resultant at least moderate spinal stenosis (series 5, image 13). Finding incompletely evaluated on this exam. Bone marrow signal intensity within normal limits. No scalp soft tissue abnormality. Patient status post left-sided ocular lens replacement. Globes and orbital soft tissues demonstrate no acute finding. Mild mucosal thickening within the ethmoidal air cells and maxillary sinuses. Paranasal sinuses are otherwise clear. Trace right mastoid effusion noted, of doubtful significance. MRA NECK FINDINGS Source time-of-flight imaging demonstrates patent antegrade flow within both carotid and vertebral arteries bilaterally. No evidence for arterial dissection on axial T1 fat-sat imaging. Visualized aortic arch of normal caliber with normal branch pattern. No hemodynamically significant stenosis about the origin of the great vessels. Visualized subclavian arteries widely patent. Right common  carotid artery patent from its  origin to the bifurcation without stenosis. Atheromatous irregularity at the proximal right ICA with relatively mild 20-25% stenosis by NASCET criteria. Right ICA widely patent distally to the skull base. Left common carotid artery patent from its origin to the bifurcation without stenosis. Short-segment mild approximate 25% stenosis at the origin of the left ICA. Left ICA otherwise widely patent to the skull base. Both of the vertebral arteries arise from the subclavian arteries. Short-segment approximate 60% stenosis at the origin of the right vertebral artery. Relatively mild no more than 20% narrowing at the origin of left vertebral artery. Vertebral arteries otherwise widely patent within the neck without stenosis or occlusion. IMPRESSION: MRI HEAD IMPRESSION: 1. No acute intracranial abnormality. No evidence for intracranial infection. 2. Mild age-related cerebral atrophy with chronic small vessel ischemic disease. MRA NECK IMPRESSION: 1. Negative MRA with no evidence for acute abnormality about the major arterial vasculature of the neck. 2. Atheromatous stenoses about the proximal ICAs bilaterally with associated narrowing of up to approximately 20-25% by NASCET criteria. 3. Short-segment approximate 60% atheromatous stenosis at the origin of the right vertebral artery. Relatively mild no more than 25% narrowing at the origin of the left vertebral artery. Vertebral arteries otherwise widely patent within the neck. Electronically Signed   By: Jeannine Boga M.D.   On: 07/26/2018 23:06   Mr Jeri Cos IR Contrast  Result Date: 07/26/2018 CLINICAL DATA:  Initial evaluation for acute unexplained altered mental status, slowly progressive weakness, neck stiffness. EXAM: MRI HEAD WITHOUT AND WITH CONTRAST MRA NECK WITHOUT AND WITH CONTRAST TECHNIQUE: Multiplanar, multiecho pulse sequences of the brain and surrounding structures were obtained without and with intravenous contrast. Angiographic images of the  neck were obtained using MRA technique without and with intravenous contrast. Carotid stenosis measurements (when applicable) are obtained utilizing NASCET criteria, using the distal internal carotid diameter as the denominator. CONTRAST:  10 cc of Gadavist. COMPARISON:  None available. FINDINGS: MRI HEAD FINDINGS Diffuse prominence of the CSF containing spaces compatible generalized age-related cerebral atrophy. Mild chronic microvascular changes present within the periventricular white matter. No abnormal foci of restricted diffusion to suggest acute or subacute ischemia. Gray-white matter differentiation maintained. No encephalomalacia to suggest chronic infarction. No foci of susceptibility artifact to suggest acute or chronic intracranial hemorrhage. No mass lesion, midline shift or mass effect. No hydrocephalus. No extra-axial fluid collection. Pituitary gland within normal limits. Midline structures intact and normal. No abnormal enhancement. No findings to suggest meningitis or intracranial infection. Hand healed and hand Major intracranial vascular flow voids are well maintained. Craniocervical junction within normal limits. Degenerative spondylolysis at C3-4 with resultant at least moderate spinal stenosis (series 5, image 13). Finding incompletely evaluated on this exam. Bone marrow signal intensity within normal limits. No scalp soft tissue abnormality. Patient status post left-sided ocular lens replacement. Globes and orbital soft tissues demonstrate no acute finding. Mild mucosal thickening within the ethmoidal air cells and maxillary sinuses. Paranasal sinuses are otherwise clear. Trace right mastoid effusion noted, of doubtful significance. MRA NECK FINDINGS Source time-of-flight imaging demonstrates patent antegrade flow within both carotid and vertebral arteries bilaterally. No evidence for arterial dissection on axial T1 fat-sat imaging. Visualized aortic arch of normal caliber with normal branch  pattern. No hemodynamically significant stenosis about the origin of the great vessels. Visualized subclavian arteries widely patent. Right common carotid artery patent from its origin to the bifurcation without stenosis. Atheromatous irregularity at the proximal right ICA with relatively mild 20-25% stenosis  by NASCET criteria. Right ICA widely patent distally to the skull base. Left common carotid artery patent from its origin to the bifurcation without stenosis. Short-segment mild approximate 25% stenosis at the origin of the left ICA. Left ICA otherwise widely patent to the skull base. Both of the vertebral arteries arise from the subclavian arteries. Short-segment approximate 60% stenosis at the origin of the right vertebral artery. Relatively mild no more than 20% narrowing at the origin of left vertebral artery. Vertebral arteries otherwise widely patent within the neck without stenosis or occlusion. IMPRESSION: MRI HEAD IMPRESSION: 1. No acute intracranial abnormality. No evidence for intracranial infection. 2. Mild age-related cerebral atrophy with chronic small vessel ischemic disease. MRA NECK IMPRESSION: 1. Negative MRA with no evidence for acute abnormality about the major arterial vasculature of the neck. 2. Atheromatous stenoses about the proximal ICAs bilaterally with associated narrowing of up to approximately 20-25% by NASCET criteria. 3. Short-segment approximate 60% atheromatous stenosis at the origin of the right vertebral artery. Relatively mild no more than 25% narrowing at the origin of the left vertebral artery. Vertebral arteries otherwise widely patent within the neck. Electronically Signed   By: Jeannine Boga M.D.   On: 07/26/2018 23:06        Scheduled Meds: . acetaminophen  650 mg Oral TID  . Chlorhexidine Gluconate Cloth  6 each Topical Q0600  . donepezil  10 mg Oral QHS  . escitalopram  20 mg Oral Daily  . finasteride  5 mg Oral Daily  . irbesartan  150 mg Oral  Daily  . ketorolac  30 mg Intravenous Q8H  . memantine  10 mg Oral BID  . mupirocin ointment  1 application Nasal BID  . pregabalin  75 mg Oral TID  . tamsulosin  0.8 mg Oral QHS   Continuous Infusions: . sodium chloride 50 mL/hr at 07/26/18 2206  . cefTRIAXone (ROCEPHIN)  IV 2 g (07/27/18 0909)     LOS: 5 days     Georgette Shell, MD [facility]  If 7PM-7AM, please contact night-coverage [email or web address] Password [facility] 07/27/2018, 11:21 AM

## 2018-07-27 NOTE — Consult Note (Signed)
[place] Nurse wound consult note Patient receiving care in [place].  60 male visitors present. Reason for Consult: MASD Wound type: MASD to bilateral buttocks Wound bed: Moist Drainage (amount, consistency, odor) no drainage, no odor, no induration Periwound: intact Dressing procedure/placement/frequency: 40% Desitin 4 times daily; air mattress; Use ONLY ONE DermaTherapy pad beneath patient's buttocks. Do NOT use disposable pads. Only use one underpad (DermaTherapy or DryFlow) beneath a patient if they are on a mattress replacement with low air loss feature; turn every 2 hours; Prevalon boots bilaterally. Monitor the wound area(s) for worsening of condition such as: Signs/symptoms of infection,  Increase in size,  Development of or worsening of odor, Development of pain, or increased pain at the affected locations.  Notify the medical team if any of these develop.  Thank you for the consult.  Discussed plan of care with the patient and bedside nurse.  [place] nurse will not follow at this time.  Please re-consult the [place] team if needed.  Val Riles, RN, MSN, CWOCN, CNS-BC, pager [phone]

## 2018-07-27 NOTE — Progress Notes (Signed)
  Speech Language Pathology Treatment: Dysphagia  Patient Details Name: Ricky Black MRN: [id number] DOB: 1941-02-12 Today's Date: 07/27/2018 Time: 0925-1005 SLP Time Calculation (min) (ACUTE ONLY): 40 min  Assessment / Plan / Recommendation Clinical Impression  Today pt is more awake than he has been during this hospital coarse.  His daughter *Therapist, sports* states he consumed orange juice, milk and a bite of oatmeal for breakfast.  Note MRI brain results yesterday negative and pt for LP today as well as MRI spine tonight or tomorrow.  Pt will need MBS during this hospital coarse given his dysphagia, neurologic disease.  Pt and daughter agreeable to plan.  Pt will need to complete MRI spine first before MBS can be completed.    Today SLP observed pt with minimal intake - as he was tired.  Immediate cough postswallow of thin water via straw- concerning for aspiration - suspect premature spillage into airway with asp before and possible aspiration during the swallow.  Controlling bolus amount to tsp of liquid improved tolerance.  Pt continues with weak cough and dysarthria - therefore advised he consume only soft foods with very strict precautions.  Posted sign in room re: diet recommendations and compensation strategies.  Reiterated goal is to mitigate aspiration not fully prevent and reviewed pt's risk factors that increase his asp pna risk.    Will plan MBS tomorrow 07/28/2018 = modulating plan based on when MRI spine completed.     HPI HPI: 78 yo male adm to [facility] with AMS possible sepsis after being treated for UTI with ABX with no improvement.  Pt recently in hospital for UTI in December and was discharged to a SNF.  Swallow evaluation ordered due to concern for pt possibly aspirating.   RN reports pt overtly coughing with intake today.  CXR (most recent) showed bilateral opacification - ? ATX vs infection- wife reports pt was congested yesterday - today improving after has been on ABX.  Family reports pt with  lethargy and islands of alertness at SNF - per wife he had a "few good days" where he was able to ambulate with PT and sit up at edge of bed to feed himself.   Pt has undergone palliative meeting with goals of care established.        SLP Plan  Continue with current plan of care;MBS(MBS next date)       Recommendations  Diet recommendations: Thin liquid(SOFT FOODS ONLY) Liquids provided via: Teaspoon(NO CUP/STRAW) Medication Administration: Whole meds with puree(crush if large) Supervision: Trained caregiver to feed patient;Full supervision/cueing for compensatory strategies Postural Changes and/or Swallow Maneuvers: Seated upright 90 degrees;Upright 30-60 min after meal                Oral Care Recommendations: Oral care BID Follow up Recommendations: Skilled Nursing facility SLP Visit Diagnosis: Dysphagia, oropharyngeal phase (R13.12) Plan: Continue with current plan of care;MBS(MBS next date)       [place], Mckayla Mulcahey Ann 07/27/2018, 10:16 AM  Luanna Salk, MS [facility] SLP Acute Rehab Services Pager [phone] Office [phone]

## 2018-07-27 NOTE — Consult Note (Signed)
Neurology Consultation Note  Consult Requested by: Dr. Rodena Piety  Reason for Consult: arm and leg weakness  Consult Date: 07/27/18  The history was obtained from the daughters.  During history and examination, all items were able to obtain unless otherwise noted.  History of Present Illness:  Ricky Black is a 78 y.o. Caucasian male with PMH of dementia, hypertension, UTI, lower back pain admitted for progressive weakness.  Patient was admitted on 06/30/2018 for confusion and back pain and difficulty walking.  CT head and neck unremarkable.  UA WBC 0-5.  He was discharged second day with antibiotics.  However, patient had a fall at home after discharge and continued confusion, he was readmitted and UA showed WBC more than 50, and grew for MSSA.  Blood culture negative.  He was started on Rocephin and then transition to nitrofurantoin.  Complained of the back pain, had MRI T/L-spine showed DJD with spinal stenosis, no significant finding.  He was discharged to SNF on 12/17.  As per family, patient initially doing gradually better in SNF, was able to walk with walker for a couple steps.  However, 1 week ago on 07/20/2018, patient started to be more sleepy and drowsy and difficulty walking.  Condition further decline over the next couple days, for which he was readmitted on 07/22/2018.  He was found to have fever temperature 101 with leukocytosis WBC 13.3, UA WBC more than 50, urine culture again showed MSSA and was put on Ancef.  Blood culture also positive for MSSA.  Also developed urinary retention, needed a Foley.  US abdomen unremarkable.  3 to 4 days ago patient also developed severe neck pain, head position for favor to the right, extremely tender to touch on the left side of her neck.  Patient continued to have altered mental status and low-grade fever on 07/26/2017, T-max 100.3, ID consulted, Ancef changed to Rocephin.  MRI brain with and without contrast showed no acute stroke or infection.  MRA neck  unremarkable.  Today, patient more awake alert, afebrile, neck pain resolved, WBC trending down from 14.4-12.6.  LP done today and result pending.  However, during this time, patient has developed arm and leg weakness.  As early as 07/22/2018 at re-admission, family noted generalized weakness along with altered mental status.  Not able to walk with walker at a SNF.  On 07/24/2018, daughter stated that patient was able to squeeze a little bit on the left hand and move toes on the left foot, but right side arm and leg not able to move at all.  For the last 2 days, patient was found to have flaccid arm and leg bilaterally, no movement at all, and muscle tone diminished and areflexia.  MRI C-spine with contrast pending and neurology was consulted.   Past Medical History:  Diagnosis Date  . Abnormal EKG    Prior tracing  with reading can't rule out old septal MI  . Bradycardia   . Dementia ([place])   . Dizziness   . Hypogonadism male   . Neuropathy    (R) Dorsal foot  . OAB (overactive bladder)   . Olecranon bursitis   . Orthostatic hypotension   . Spinal arthritis    ESI    Past Surgical History:  Procedure Laterality Date  . COLONOSCOPY  04/15/2012  . LUMBAR LAMINECTOMY    . TOTAL HIP ARTHROPLASTY     Right  . UMBILICAL HERNIA REPAIR      Family History  Family history unknown: Yes  Social History:  reports that he has quit smoking. He has never used smokeless tobacco. He reports that he does not drink alcohol. No history on file for drug.  Allergies: No Known Allergies  No current facility-administered medications on file prior to encounter.    Current Outpatient Medications on File Prior to Encounter  Medication Sig Dispense Refill  . bethanechol (URECHOLINE) 25 MG tablet Take 25 mg by mouth 2 (two) times daily.     . celecoxib (CELEBREX) 200 MG capsule Take 200 mg by mouth daily.    . cyclobenzaprine (FLEXERIL) 5 MG tablet Take 1 tablet (5 mg total) by mouth 2 (two) times  daily as needed for muscle spasms. (Patient taking differently: Take 5 mg by mouth every 12 (twelve) hours as needed for muscle spasms. ) 10 tablet 0  . donepezil (ARICEPT) 10 MG tablet Take 10 mg by mouth at bedtime.     Marland Kitchen escitalopram (LEXAPRO) 20 MG tablet Take 20 mg by mouth daily.    . finasteride (PROSCAR) 5 MG tablet Take 5 mg by mouth daily.    . hydrochlorothiazide (HYDRODIURIL) 25 MG tablet Take 25 mg by mouth daily.     Marland Kitchen HYDROcodone-acetaminophen (NORCO) 7.5-325 MG tablet Take 1 tablet by mouth 2 (two) times daily as needed for severe pain.    Marland Kitchen HYDROcodone-acetaminophen (NORCO) 7.5-325 MG tablet Take 1 tablet by mouth every morning.    Marland Kitchen LORazepam (ATIVAN) 0.5 MG tablet Take 0.5 mg by mouth at bedtime as needed for anxiety. 14 day regimen started on 07/16/2018 ends on 07/30/2018    . Melatonin 3 MG TABS Take 1 tablet by mouth at bedtime.    . memantine (NAMENDA) 10 MG tablet Take 10 mg by mouth 2 (two) times daily.     . potassium chloride (K-DUR) 10 MEQ tablet Take 1 tablet (10 mEq total) by mouth daily. 5 tablet 0  . pregabalin (LYRICA) 75 MG capsule Take 75 mg by mouth 3 (three) times daily.     . tamsulosin (FLOMAX) 0.4 MG CAPS capsule Take 0.8 mg by mouth at bedtime.    . valsartan (DIOVAN) 160 MG tablet Take 160 mg by mouth daily.       Review of Systems: A full ROS was attempted today and was not able to be performed due to encephalopathy.  Physical Examination: Temp:  [97.2 F (36.2 C)-99.8 F (37.7 C)] 99.8 F (37.7 C) (01/08 0546) Pulse Rate:  [77-86] 86 (01/08 0546) Resp:  [20-24] 20 (01/08 1000) BP: (140-152)/(72-80) 140/72 (01/08 1000) SpO2:  [95 %-96 %] 96 % (01/08 0546)  General - well nourished, well developed, in no apparent distress.    Ophthalmologic - fundi not visualized due to noncooperation.    Cardiovascular - regular rate and rhythm  Neuro -mildly sleepy, but easily arousable, eyes open, able to follow simple commands.  Not orientated to place,  time, but able to orientated to people.  Moderate dysarthria, able to name 3 out of 4, able to repeat but paucity of speech.  PERRL, EOMI, visual field full, facial symmetrical, tongue midline.  No tenderness on neck palpation.  Bilateral upper and lower extremity flaccid, 0/5, diminished muscle tone, DTR diminished, no Babinski.  No ankle clonus.  Sensation not corporative, but appears to be decreased to pain stimulation bilaterally, no significant sensation level or not corporative on sensation testing.  DTR diminished, no Babinski.  Gait not tested.  Foley catheter in place.  Data Reviewed: Dg Neck Soft Tissue  Result  Date: 07/26/2018 CLINICAL DATA:  Severe left sided neck pain worsening over last two days; limited range of motion EXAM: NECK SOFT TISSUES - 1+ VIEW COMPARISON:  Neck CT 07/01/2018 FINDINGS: Multilevel disc osteophytic disease with facet hypertrophy. Patient's head is tilted rightward. No acute findings in the cervical spine identified. No prevertebral soft tissue thickening. No abnormality in the oropharynx or hypopharynx identified. IMPRESSION: No acute findings identified by plain film radiography. Electronically Signed   By: Suzy Bouchard M.D.   On: 07/26/2018 14:17   Dg Chest 1 View  Result Date: 07/24/2018 CLINICAL DATA:  Shortness of breath and weakness. EXAM: CHEST  1 VIEW COMPARISON:  07/22/2018 FINDINGS: Lungs are hypoinflated with mild bibasilar opacification which may be due to atelectasis or infection. Cardiomediastinal silhouette and remainder the exam is unchanged. IMPRESSION: Bibasilar opacification which may be due to atelectasis or infection. Electronically Signed   By: Marin Olp M.D.   On: 07/24/2018 12:34   Dg Chest 1 View  Result Date: 07/01/2018 CLINICAL DATA:  Fall. EXAM: CHEST  1 VIEW COMPARISON:  Chest radiograph August 07, 2005 FINDINGS: The cardiac silhouette is upper limits of normal in size. Mediastinal silhouette is not suspicious. No pleural  effusion or focal consolidation. No pneumothorax. Soft tissue planes and included osseous structures are nonacute. IMPRESSION: Borderline cardiomegaly, no acute pulmonary process. Electronically Signed   By: Elon Alas M.D.   On: 07/01/2018 23:05   Ct Head Wo Contrast  Result Date: 07/22/2018 CLINICAL DATA:  Altered level of consciousness, sepsis EXAM: CT HEAD WITHOUT CONTRAST TECHNIQUE: Contiguous axial images were obtained from the base of the skull through the vertex without intravenous contrast. COMPARISON:  07/01/2018 FINDINGS: Brain: Similar atrophy pattern and chronic white matter microvascular ischemic changes throughout both cerebral hemispheres. No acute intracranial hemorrhage, mass lesion, new infarction, midline shift, herniation, hydrocephalus, or extra-axial fluid collection. No focal mass effect or edema. Cisterns are patent. Minor cerebellar atrophy as well. Vascular: No hyperdense vessel or unexpected calcification. Skull: Normal. Negative for fracture or focal lesion. Sinuses/Orbits: No acute finding. Other: None. IMPRESSION: Stable atrophy and chronic white matter microvascular ischemic changes. No acute intracranial abnormality by noncontrast CT. Electronically Signed   By: Jerilynn Mages.  Shick M.D.   On: 07/22/2018 13:47   Ct Head Wo Contrast  Result Date: 07/01/2018 CLINICAL DATA:  Altered mental status S. Fall. Urinary tract infection. Confusion. EXAM: CT HEAD WITHOUT CONTRAST CT CERVICAL SPINE WITHOUT CONTRAST TECHNIQUE: Multidetector CT imaging of the head and cervical spine was performed following the standard protocol without intravenous contrast. Multiplanar CT image reconstructions of the cervical spine were also generated. COMPARISON:  06/30/2018 FINDINGS: Despite efforts by the technologist and patient, motion artifact is present on today's exam and could not be eliminated. This reduces exam sensitivity and specificity. CT HEAD FINDINGS Brain: The brainstem, cerebellum, cerebral  peduncles, thalami, basal ganglia, basilar cisterns, and ventricular system appear within normal limits. No intracranial hemorrhage, mass lesion, or acute CVA. Overall no significant intracranial abnormality. Vascular: Unremarkable Skull: Unremarkable Sinuses/Orbits: Chronic right maxillary and ethmoid sinusitis. Other: No supplemental non-categorized findings. CT CERVICAL SPINE FINDINGS Alignment: No vertebral subluxation is observed. Skull base and vertebrae: Reduced sensitivity due to motion, no discrete fracture is identified. Fused C2 and C3 vertebra. Soft tissues and spinal canal: Bilateral common carotid atherosclerotic calcification. Disc levels: Foraminal impingement at all cervical levels due to uncinate spurring and facet arthropathy. Upper chest: Unremarkable Other: Served skip other IMPRESSION: 1. No acute intracranial findings or acute cervical spine findings.  2. Chronic right maxillary and ethmoid sinusitis. 3. Reduced sensitivity due to motion artifact. 4. Foraminal impingement at all cervical levels due to uncinate spurring and facet arthropathy. 5. Congenital fusion at C2-3. Electronically Signed   By: Van Clines M.D.   On: 07/01/2018 23:27   Ct Head Wo Contrast  Result Date: 06/30/2018 CLINICAL DATA:  Altered mental status.  Head trauma. EXAM: CT HEAD WITHOUT CONTRAST CT CERVICAL SPINE WITHOUT CONTRAST TECHNIQUE: Multidetector CT imaging of the head and cervical spine was performed following the standard protocol without intravenous contrast. Multiplanar CT image reconstructions of the cervical spine were also generated. COMPARISON:  Report from 09/07/2002 FINDINGS: Despite efforts by the technologist and patient, motion artifact is present on today's exam and could not be eliminated. This reduces exam sensitivity and specificity. CT HEAD FINDINGS Brain: The brainstem, cerebellum, cerebral peduncles, thalami, basal ganglia, basilar cisterns, and ventricular system appear within normal  limits. No intracranial hemorrhage, mass lesion, or acute CVA. Vascular: Unremarkable Skull: Unremarkable Sinuses/Orbits: Mild chronic right maxillary sinusitis. Other: No supplemental non-categorized findings. CT CERVICAL SPINE FINDINGS Alignment: No vertebral subluxation is observed. Skull base and vertebrae: There is congenital fusion at C2-3 between the vertebral bodies and posterior elements. Prominent loss of articular space anteriorly at C1-2. Schmorl's nodes at C4-5. No appreciable fracture. Soft tissues and spinal canal: No prevertebral soft tissue swelling. Bilateral common carotid atherosclerotic calcification. Disc levels: Facet and uncinate spurring cause foraminal impingement on the right at C3-4, C4-5, C5-6, and C6-7; and on the left at C4-5, C5-6, C6-7, and C7-T1. There is central narrowing of the thecal sac due to spurring at C3-4 and C4-5. Upper chest: Unremarkable Other: No supplemental non-categorized findings. IMPRESSION: 1. No acute intracranial findings or acute cervical spine findings. 2. Impingement at all levels between C3 and T1 due to facet and uncinate spurring. 3. Congenital fusion at C2-3. 4. Atherosclerosis. 5. Mild chronic right maxillary sinusitis. Electronically Signed   By: Van Clines M.D.   On: 06/30/2018 20:50   Ct Cervical Spine Wo Contrast  Result Date: 07/01/2018 CLINICAL DATA:  Altered mental status S. Fall. Urinary tract infection. Confusion. EXAM: CT HEAD WITHOUT CONTRAST CT CERVICAL SPINE WITHOUT CONTRAST TECHNIQUE: Multidetector CT imaging of the head and cervical spine was performed following the standard protocol without intravenous contrast. Multiplanar CT image reconstructions of the cervical spine were also generated. COMPARISON:  06/30/2018 FINDINGS: Despite efforts by the technologist and patient, motion artifact is present on today's exam and could not be eliminated. This reduces exam sensitivity and specificity. CT HEAD FINDINGS Brain: The  brainstem, cerebellum, cerebral peduncles, thalami, basal ganglia, basilar cisterns, and ventricular system appear within normal limits. No intracranial hemorrhage, mass lesion, or acute CVA. Overall no significant intracranial abnormality. Vascular: Unremarkable Skull: Unremarkable Sinuses/Orbits: Chronic right maxillary and ethmoid sinusitis. Other: No supplemental non-categorized findings. CT CERVICAL SPINE FINDINGS Alignment: No vertebral subluxation is observed. Skull base and vertebrae: Reduced sensitivity due to motion, no discrete fracture is identified. Fused C2 and C3 vertebra. Soft tissues and spinal canal: Bilateral common carotid atherosclerotic calcification. Disc levels: Foraminal impingement at all cervical levels due to uncinate spurring and facet arthropathy. Upper chest: Unremarkable Other: Served skip other IMPRESSION: 1. No acute intracranial findings or acute cervical spine findings. 2. Chronic right maxillary and ethmoid sinusitis. 3. Reduced sensitivity due to motion artifact. 4. Foraminal impingement at all cervical levels due to uncinate spurring and facet arthropathy. 5. Congenital fusion at C2-3. Electronically Signed   By: Cindra Eves.D.  On: 07/01/2018 23:27   Ct Cervical Spine Wo Contrast  Result Date: 06/30/2018 CLINICAL DATA:  Altered mental status.  Head trauma. EXAM: CT HEAD WITHOUT CONTRAST CT CERVICAL SPINE WITHOUT CONTRAST TECHNIQUE: Multidetector CT imaging of the head and cervical spine was performed following the standard protocol without intravenous contrast. Multiplanar CT image reconstructions of the cervical spine were also generated. COMPARISON:  Report from 09/07/2002 FINDINGS: Despite efforts by the technologist and patient, motion artifact is present on today's exam and could not be eliminated. This reduces exam sensitivity and specificity. CT HEAD FINDINGS Brain: The brainstem, cerebellum, cerebral peduncles, thalami, basal ganglia, basilar cisterns,  and ventricular system appear within normal limits. No intracranial hemorrhage, mass lesion, or acute CVA. Vascular: Unremarkable Skull: Unremarkable Sinuses/Orbits: Mild chronic right maxillary sinusitis. Other: No supplemental non-categorized findings. CT CERVICAL SPINE FINDINGS Alignment: No vertebral subluxation is observed. Skull base and vertebrae: There is congenital fusion at C2-3 between the vertebral bodies and posterior elements. Prominent loss of articular space anteriorly at C1-2. Schmorl's nodes at C4-5. No appreciable fracture. Soft tissues and spinal canal: No prevertebral soft tissue swelling. Bilateral common carotid atherosclerotic calcification. Disc levels: Facet and uncinate spurring cause foraminal impingement on the right at C3-4, C4-5, C5-6, and C6-7; and on the left at C4-5, C5-6, C6-7, and C7-T1. There is central narrowing of the thecal sac due to spurring at C3-4 and C4-5. Upper chest: Unremarkable Other: No supplemental non-categorized findings. IMPRESSION: 1. No acute intracranial findings or acute cervical spine findings. 2. Impingement at all levels between C3 and T1 due to facet and uncinate spurring. 3. Congenital fusion at C2-3. 4. Atherosclerosis. 5. Mild chronic right maxillary sinusitis. Electronically Signed   By: Van Clines M.D.   On: 06/30/2018 20:50   Mr Jodene Nam Neck W Wo Contrast  Result Date: 07/26/2018 CLINICAL DATA:  Initial evaluation for acute unexplained altered mental status, slowly progressive weakness, neck stiffness. EXAM: MRI HEAD WITHOUT AND WITH CONTRAST MRA NECK WITHOUT AND WITH CONTRAST TECHNIQUE: Multiplanar, multiecho pulse sequences of the brain and surrounding structures were obtained without and with intravenous contrast. Angiographic images of the neck were obtained using MRA technique without and with intravenous contrast. Carotid stenosis measurements (when applicable) are obtained utilizing NASCET criteria, using the distal internal carotid  diameter as the denominator. CONTRAST:  10 cc of Gadavist. COMPARISON:  None available. FINDINGS: MRI HEAD FINDINGS Diffuse prominence of the CSF containing spaces compatible generalized age-related cerebral atrophy. Mild chronic microvascular changes present within the periventricular white matter. No abnormal foci of restricted diffusion to suggest acute or subacute ischemia. Gray-white matter differentiation maintained. No encephalomalacia to suggest chronic infarction. No foci of susceptibility artifact to suggest acute or chronic intracranial hemorrhage. No mass lesion, midline shift or mass effect. No hydrocephalus. No extra-axial fluid collection. Pituitary gland within normal limits. Midline structures intact and normal. No abnormal enhancement. No findings to suggest meningitis or intracranial infection. Hand healed and hand Major intracranial vascular flow voids are well maintained. Craniocervical junction within normal limits. Degenerative spondylolysis at C3-4 with resultant at least moderate spinal stenosis (series 5, image 13). Finding incompletely evaluated on this exam. Bone marrow signal intensity within normal limits. No scalp soft tissue abnormality. Patient status post left-sided ocular lens replacement. Globes and orbital soft tissues demonstrate no acute finding. Mild mucosal thickening within the ethmoidal air cells and maxillary sinuses. Paranasal sinuses are otherwise clear. Trace right mastoid effusion noted, of doubtful significance. MRA NECK FINDINGS Source time-of-flight imaging demonstrates patent antegrade flow within  both carotid and vertebral arteries bilaterally. No evidence for arterial dissection on axial T1 fat-sat imaging. Visualized aortic arch of normal caliber with normal branch pattern. No hemodynamically significant stenosis about the origin of the great vessels. Visualized subclavian arteries widely patent. Right common carotid artery patent from its origin to the  bifurcation without stenosis. Atheromatous irregularity at the proximal right ICA with relatively mild 20-25% stenosis by NASCET criteria. Right ICA widely patent distally to the skull base. Left common carotid artery patent from its origin to the bifurcation without stenosis. Short-segment mild approximate 25% stenosis at the origin of the left ICA. Left ICA otherwise widely patent to the skull base. Both of the vertebral arteries arise from the subclavian arteries. Short-segment approximate 60% stenosis at the origin of the right vertebral artery. Relatively mild no more than 20% narrowing at the origin of left vertebral artery. Vertebral arteries otherwise widely patent within the neck without stenosis or occlusion. IMPRESSION: MRI HEAD IMPRESSION: 1. No acute intracranial abnormality. No evidence for intracranial infection. 2. Mild age-related cerebral atrophy with chronic small vessel ischemic disease. MRA NECK IMPRESSION: 1. Negative MRA with no evidence for acute abnormality about the major arterial vasculature of the neck. 2. Atheromatous stenoses about the proximal ICAs bilaterally with associated narrowing of up to approximately 20-25% by NASCET criteria. 3. Short-segment approximate 60% atheromatous stenosis at the origin of the right vertebral artery. Relatively mild no more than 25% narrowing at the origin of the left vertebral artery. Vertebral arteries otherwise widely patent within the neck. Electronically Signed   By: Jeannine Boga M.D.   On: 07/26/2018 23:06   Mr Jeri Cos ZM Contrast  Result Date: 07/26/2018 CLINICAL DATA:  Initial evaluation for acute unexplained altered mental status, slowly progressive weakness, neck stiffness. EXAM: MRI HEAD WITHOUT AND WITH CONTRAST MRA NECK WITHOUT AND WITH CONTRAST TECHNIQUE: Multiplanar, multiecho pulse sequences of the brain and surrounding structures were obtained without and with intravenous contrast. Angiographic images of the neck were  obtained using MRA technique without and with intravenous contrast. Carotid stenosis measurements (when applicable) are obtained utilizing NASCET criteria, using the distal internal carotid diameter as the denominator. CONTRAST:  10 cc of Gadavist. COMPARISON:  None available. FINDINGS: MRI HEAD FINDINGS Diffuse prominence of the CSF containing spaces compatible generalized age-related cerebral atrophy. Mild chronic microvascular changes present within the periventricular white matter. No abnormal foci of restricted diffusion to suggest acute or subacute ischemia. Gray-white matter differentiation maintained. No encephalomalacia to suggest chronic infarction. No foci of susceptibility artifact to suggest acute or chronic intracranial hemorrhage. No mass lesion, midline shift or mass effect. No hydrocephalus. No extra-axial fluid collection. Pituitary gland within normal limits. Midline structures intact and normal. No abnormal enhancement. No findings to suggest meningitis or intracranial infection. Hand healed and hand Major intracranial vascular flow voids are well maintained. Craniocervical junction within normal limits. Degenerative spondylolysis at C3-4 with resultant at least moderate spinal stenosis (series 5, image 13). Finding incompletely evaluated on this exam. Bone marrow signal intensity within normal limits. No scalp soft tissue abnormality. Patient status post left-sided ocular lens replacement. Globes and orbital soft tissues demonstrate no acute finding. Mild mucosal thickening within the ethmoidal air cells and maxillary sinuses. Paranasal sinuses are otherwise clear. Trace right mastoid effusion noted, of doubtful significance. MRA NECK FINDINGS Source time-of-flight imaging demonstrates patent antegrade flow within both carotid and vertebral arteries bilaterally. No evidence for arterial dissection on axial T1 fat-sat imaging. Visualized aortic arch of normal caliber with normal  branch pattern.  No hemodynamically significant stenosis about the origin of the great vessels. Visualized subclavian arteries widely patent. Right common carotid artery patent from its origin to the bifurcation without stenosis. Atheromatous irregularity at the proximal right ICA with relatively mild 20-25% stenosis by NASCET criteria. Right ICA widely patent distally to the skull base. Left common carotid artery patent from its origin to the bifurcation without stenosis. Short-segment mild approximate 25% stenosis at the origin of the left ICA. Left ICA otherwise widely patent to the skull base. Both of the vertebral arteries arise from the subclavian arteries. Short-segment approximate 60% stenosis at the origin of the right vertebral artery. Relatively mild no more than 20% narrowing at the origin of left vertebral artery. Vertebral arteries otherwise widely patent within the neck without stenosis or occlusion. IMPRESSION: MRI HEAD IMPRESSION: 1. No acute intracranial abnormality. No evidence for intracranial infection. 2. Mild age-related cerebral atrophy with chronic small vessel ischemic disease. MRA NECK IMPRESSION: 1. Negative MRA with no evidence for acute abnormality about the major arterial vasculature of the neck. 2. Atheromatous stenoses about the proximal ICAs bilaterally with associated narrowing of up to approximately 20-25% by NASCET criteria. 3. Short-segment approximate 60% atheromatous stenosis at the origin of the right vertebral artery. Relatively mild no more than 25% narrowing at the origin of the left vertebral artery. Vertebral arteries otherwise widely patent within the neck. Electronically Signed   By: Jeannine Boga M.D.   On: 07/26/2018 23:06   Mr Thoracic Spine Wo Contrast  Result Date: 07/01/2018 CLINICAL DATA:  78 y/o M; worsening back pain, confusion, generalized weakness over the past couple of days. EXAM: MRI THORACIC AND LUMBAR SPINE WITHOUT CONTRAST TECHNIQUE: Multiplanar and  multiecho pulse sequences of the thoracic and lumbar spine were obtained without intravenous contrast. The patient was confused and moving, unable to continue the exam, lumbar axial sequences were not acquired. COMPARISON:  06/29/2018 CT abdomen pelvis. FINDINGS: MRI THORACIC SPINE FINDINGS Alignment:  Physiologic. Vertebrae: No fracture, evidence of discitis, or bone lesion. Cord:  Normal signal and morphology. Paraspinal and other soft tissues: Negative. Disc levels: Mild multilevel disc and facet degenerative changes. No significant foraminal or canal stenosis. MRI LUMBAR SPINE FINDINGS Segmentation:  Standard. Alignment:  Straightening of lumbar lordosis.  No listhesis. Vertebrae: No fracture, evidence of discitis, or bone lesion. Bilateral L3-4 facet edema, greater on the right, with edema in surrounding soft tissues, likely related to degenerative facet arthritis. Conus medullaris and cauda equina: Conus extends to the L1 level. Conus and cauda equina appear normal. Paraspinal and other soft tissues: Negative. Disc levels: Sagittal sequences only. 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 L1-2: No significant disc displacement, foraminal stenosis, or canal stenosis. L2-3: Disc bulge with left-greater-than-right facet hypertrophy. Mild left foraminal stenosis. Moderate to severe spinal canal stenosis. L3-4: Disc bulge with advanced bilateral facet hypertrophy, greater on the right. Moderate bilateral foraminal stenosis. Severe spinal canal stenosis. L4-5: Disc bulge with endplate marginal osteophytes and advanced facet hypertrophy. Moderate right and severe left foraminal stenosis. Mild spinal canal stenosis. L5-S1: Disc bulge with endplate marginal osteophytes and advanced bilateral facet hypertrophy. Moderate to severe bilateral foraminal stenosis. Mild spinal canal stenosis. The disc bulge narrows the bilateral recesses with contact on the descending S1 nerve roots. IMPRESSION: MR THORACIC  SPINE IMPRESSION 1. No acute osseous or cord signal abnormality. 2. Mild multilevel discogenic degenerative changes of the thoracic spine. No foraminal or canal stenosis. MR LUMBAR SPINE IMPRESSION 1. Sagittal sequences only. 2.  No acute osseous abnormality or malalignment. 3. 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 4. Bilateral L3-4 facet edema, likely degenerative. 5. Moderate to severe L2-3 and severe L3-4 spinal canal stenosis. Multilevel high-grade foraminal stenosis. Electronically Signed   By: Kristine Garbe M.D.   On: 07/01/2018 03:47   Mr Lumbar Spine Wo Contrast  Result Date: 07/01/2018 CLINICAL DATA:  78 y/o M; worsening back pain, confusion, generalized weakness over the past couple of days. EXAM: MRI THORACIC AND LUMBAR SPINE WITHOUT CONTRAST TECHNIQUE: Multiplanar and multiecho pulse sequences of the thoracic and lumbar spine were obtained without intravenous contrast. The patient was confused and moving, unable to continue the exam, lumbar axial sequences were not acquired. COMPARISON:  06/29/2018 CT abdomen pelvis. FINDINGS: MRI THORACIC SPINE FINDINGS Alignment:  Physiologic. Vertebrae: No fracture, evidence of discitis, or bone lesion. Cord:  Normal signal and morphology. Paraspinal and other soft tissues: Negative. Disc levels: Mild multilevel disc and facet degenerative changes. No significant foraminal or canal stenosis. MRI LUMBAR SPINE FINDINGS Segmentation:  Standard. Alignment:  Straightening of lumbar lordosis.  No listhesis. Vertebrae: No fracture, evidence of discitis, or bone lesion. Bilateral L3-4 facet edema, greater on the right, with edema in surrounding soft tissues, likely related to degenerative facet arthritis. Conus medullaris and cauda equina: Conus extends to the L1 level. Conus and cauda equina appear normal. Paraspinal and other soft tissues: Negative. Disc  levels: Sagittal sequences only. 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 L1-2: No significant disc displacement, foraminal stenosis, or canal stenosis. L2-3: Disc bulge with left-greater-than-right facet hypertrophy. Mild left foraminal stenosis. Moderate to severe spinal canal stenosis. L3-4: Disc bulge with advanced bilateral facet hypertrophy, greater on the right. Moderate bilateral foraminal stenosis. Severe spinal canal stenosis. L4-5: Disc bulge with endplate marginal osteophytes and advanced facet hypertrophy. Moderate right and severe left foraminal stenosis. Mild spinal canal stenosis. L5-S1: Disc bulge with endplate marginal osteophytes and advanced bilateral facet hypertrophy. Moderate to severe bilateral foraminal stenosis. Mild spinal canal stenosis. The disc bulge narrows the bilateral recesses with contact on the descending S1 nerve roots. IMPRESSION: MR THORACIC SPINE IMPRESSION 1. No acute osseous or cord signal abnormality. 2. Mild multilevel discogenic degenerative changes of the thoracic spine. No foraminal or canal stenosis. MR LUMBAR SPINE IMPRESSION 1. Sagittal sequences only. 2. No acute osseous abnormality or malalignment. 3. 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 4. Bilateral L3-4 facet edema, likely degenerative. 5. Moderate to severe L2-3 and severe L3-4 spinal canal stenosis. Multilevel high-grade foraminal stenosis. Electronically Signed   By: Kristine Garbe M.D.   On: 07/01/2018 03:47   US Abdomen Complete  Result Date: 07/23/2018 CLINICAL DATA:  Elevated liver function tests. EXAM: ABDOMEN ULTRASOUND COMPLETE COMPARISON:  CT of the abdomen without contrast on 07/22/2018 FINDINGS: Gallbladder: No gallstones or wall thickening visualized. No sonographic Murphy sign noted by sonographer. Common bile duct: Diameter: 5 mm. Liver: The liver demonstrates  coarse echotexture and increased echogenicity, likely reflecting diffuse steatosis. No overt cirrhotic contour abnormalities or focal lesions are identified. There is no evidence of intrahepatic biliary ductal dilatation. Left lobe hepatic cyst shows mild internal septation and measures approximately 2.6 cm in greatest diameter. No solid masses appreciated by ultrasound. Portal vein is patent on color Doppler imaging with normal direction of blood flow towards the liver. IVC: No abnormality visualized. Pancreas:  Visualized portion unremarkable. Spleen: Size and appearance within normal limits. Right Kidney: Length: 12.6 cm. Echogenicity within normal limits. No solid mass or hydronephrosis visualized. 3 cm cyst of the upper pole has a benign appearance by ultrasound. Left Kidney: Length: 12.9 cm. Echogenicity within normal limits. No mass or hydronephrosis visualized. Abdominal aorta: No aneurysm visualized. Other findings: None. IMPRESSION: Increased echogenicity of the liver parenchyma consistent with diffuse steatosis. No evidence of biliary obstruction. Simple cyst in the left lobe of the liver. Electronically Signed   By: Aletta Edouard M.D.   On: 07/23/2018 08:28   Dg Chest Port 1 View  Result Date: 07/22/2018 CLINICAL DATA:  Per EMS-states recurrent UTI and has been on 3 different antibiotics and not getting better, possible sepsis. EXAM: PORTABLE CHEST - 1 VIEW COMPARISON:  07/01/2018 and previous FINDINGS: Relatively low lung volumes. Mild atelectasis at the lung bases. Heart size upper limits normal for technique. Aortic Atherosclerosis (ICD10-170.0). Blunting of right lateral costophrenic angle, cannot exclude small effusion. Bilateral shoulder DJD. IMPRESSION: 1. Low lung volumes with bibasilar atelectasis. 2. Possible small right effusion. Electronically Signed   By: Lucrezia Europe M.D.   On: 07/22/2018 10:57   Dg Fluoro Guided Needle Plc Aspiration/injection Loc  Result Date: 07/27/2018 CLINICAL  DATA:  Confusion.  Unable to move arms in legs. EXAM: DIAGNOSTIC LUMBAR PUNCTURE UNDER FLUOROSCOPIC GUIDANCE FLUOROSCOPY TIME:  Fluoroscopy Time:  42 seconds Radiation Exposure Index (if provided by the fluoroscopic device): Is 16.2 mGy Number of Acquired Spot Images: 0 PROCEDURE: Informed consent was obtained from the patient prior to the procedure, including potential complications of headache, allergy, and pain. With the patient prone, the lower back was prepped with Betadine. 1% Lidocaine was used for local anesthesia. Lumbar puncture was performed initially at the surgical level at L4-5 using a 20 gauge needle. There was slow return of blood tinged fluid. Opening pressure very low, approximately 12-13 cm of water. Very little fluid could be collected due to the very low pressures despite tilting the head up. Therefore, the needle was repositioned at a 2nd level at L3-4. Same blood tension fluid returned, with same low pressure. Very little fluid could be collected. A total of approximately 1 mL of blood-tinged cloudy fluid was collected. Fluid was sent for laboratory testing. The patient tolerated the procedure well and there were no apparent complications. IMPRESSION: Difficult lumbar puncture under fluoroscopic guidance due to very low pressures. Only a small amount of blood-tinged cloudy fluid could be collected despite tilting head significantly upward. Electronically Signed   By: Rolm Baptise M.D.   On: 07/27/2018 14:47   Ct Renal Stone Study  Result Date: 07/22/2018 CLINICAL DATA:  Flank pain.  Urinary tract infection. EXAM: CT ABDOMEN AND PELVIS WITHOUT CONTRAST TECHNIQUE: Multidetector CT imaging of the abdomen and pelvis was performed following the standard protocol without IV contrast. COMPARISON:  06/29/2018 FINDINGS: Lower chest: Small bilateral pleural effusions with dependent atelectasis. Hepatobiliary: Diffuse hepatic steatosis. Lobulated cyst in the lateral segment of the left lobe of the  liver is stable. Gallbladder is within normal limits. Pancreas: Unremarkable Spleen: Unremarkable Adrenals/Urinary Tract: There is no hydronephrosis. No urinary calculus. There are simple cysts in both kidneys. These are not significantly changed. Bladder is grossly within normal limits. Stomach/Bowel: Sigmoid diverticulosis. No obvious mass in the colon. No evidence of small-bowel obstruction. Small hiatal hernia is noted. Vascular/Lymphatic: Atherosclerotic vascular calcifications are seen. No abnormal retroperitoneal adenopathy. Reproductive: Normal prostate. Other: No free fluid. Musculoskeletal: No vertebral compression deformity.  Postoperative changes from L5 decompression. IMPRESSION: No evidence of urinary calculus or urinary obstruction. Small pleural effusions and dependent atelectasis. Chronic changes are noted. Electronically Signed   By: Marybelle Killings M.D.   On: 07/22/2018 16:10   Ct Angio Abd/pel W And/or Wo Contrast  Result Date: 06/29/2018 CLINICAL DATA:  Low back pain beginning this morning. Evaluate for abdominal aortic aneurysm. EXAM: CT ANGIOGRAPHY ABDOMEN AND PELVIS WITH CONTRAST AND WITHOUT CONTRAST TECHNIQUE: Multidetector CT imaging of the abdomen and pelvis was performed using the standard protocol during bolus administration of intravenous contrast. Multiplanar reconstructed images and MIPs were obtained and reviewed to evaluate the vascular anatomy. CONTRAST:  165mL ISOVUE-370 IOPAMIDOL (ISOVUE-370) INJECTION 76% COMPARISON:  CT abdomen and pelvis - 04/27/2016; 03/01/2013; 07/12/2003 FINDINGS: VASCULAR Aorta: Moderate amount of predominantly calcified atherosclerotic plaque within a normal caliber abdominal aorta, not resulting in hemodynamically significant stenosis. No abdominal aortic dissection or periaortic stranding. Celiac: There is a minimal amount eccentric calcified plaque about the origin of the celiac artery, not resulting in hemodynamically significant stenosis.  Conventional branching pattern. SMA: There is a minimal amount of mixed calcified and noncalcified atherosclerotic plaque involving the origin and proximal aspect of the SMA, not resulting in hemodynamically significant stenosis. Conventional branching pattern. The distal tributaries of the SMA appear widely patent without discrete intraluminal filling defect suggest distal embolism. Renals: Solitary bilaterally; there is a moderate amount of eccentric calcified atherosclerotic plaque involving the origin of the bilateral renal arteries which approaches 50% luminal narrowing bilaterally though is without associated asymmetric renal atrophy or perinephric stranding. IMA: Widely patent. Inflow: There is a minimal amount of crescentic calcified atherosclerotic plaque within the bilateral normal caliber common iliac arteries, not resulting in hemodynamically significant stenosis. The bilateral external iliac arteries are tortuous though widely patent of normal caliber. The bilateral internal iliac arteries are mildly disease though patent and of normal caliber. Proximal Outflow: There is a minimal amount of eccentric probably calcified atherosclerotic plaque involving the bilateral common femoral arteries, not resulting in hemodynamically significant stenosis. The bilateral superficial and deep femoral arteries appear widely patent throughout their imaged courses. Veins: The IVC and pelvic venous system appear widely patent on this arterial phase examination. Review of the MIP images confirms the above findings. _________________________________________________________ NON-VASCULAR Lower chest: Limited visualization of the lower thorax demonstrates minimal subsegmental atelectasis within the image right lower lobe. No focal airspace opacities. No pleural effusion. Hepatobiliary: There is diffuse decreased attenuation of the hepatic parenchyma on this postcontrast examination suggestive of hepatic steatosis. No discrete  hyperenhancing hepatic lesions. Normal appearance of the gallbladder given degree distention. No radiopaque gallstones. No ascites. Pancreas: Normal appearance of the pancreas Spleen: Normal appearance of the spleen Adrenals/Urinary Tract: There is symmetric enhancement of the bilateral kidneys. Note is made of an approximately 2.4 cm hypoattenuating (11 Hounsfield unit) left-sided renal cyst (image 37, series 5 as well as an approximately 3.3 cm exophytic hypoattenuating right-sided renal cyst (image 24). There is geographic cortical atrophy involving the superior pole of the right kidney (image coronal image 134, series 8). No discrete worrisome renal lesions. No definite evidence of nephrolithiasis on this postcontrast examination. There is a very minimal amount of likely age and body habitus related perinephric stranding. No urine obstruction. There is mild thickening of the bilateral adrenal glands without discrete nodule. Normal appearance of the urinary bladder given degree of distention. Stomach/Bowel: Scattered colonic diverticulosis primarily involving the distal aspect of the descending and sigmoid colon, without evidence of superimposed acute diverticulitis.  Normal appearance of the terminal ileum and appendix. Moderate size hiatal hernia. No pneumoperitoneum, pneumatosis or portal venous gas. Lymphatic: No bulky retroperitoneal, mesenteric, pelvic or inguinal lymphadenopathy. Reproductive: Dystrophic calcifications within normal sized prostate gland, though note, evaluation degraded secondary streak artifact from the patient's right total hip prosthesis. Other: Minimal amount of subcutaneous edema about the midline of the low back. Musculoskeletal: No definite acute or aggressive osseous abnormalities. Post right total hip replacement without definitive evidence of hardware failure loosening though note the caudal aspect of the right femoral stem component was not imaged. There are multiple peripherally  calcified ossicles superior to the right greater trochanter likely the sequela of prior trauma and/or iatrogenic. Severe degenerative change of the contralateral left hip with joint space loss, subchondral sclerosis and osteophytosis. No evidence avascular necrosis. Moderate severe degenerative change throughout the lumbar spine. Stigmata of DISH throughout the thoracic and lumbar spine. Post L5 laminectomy with linear calcifications posterior to the sacrum with associated linear subcutaneous scarring. IMPRESSION: VASCULAR 1. No definite explanation for patient's acute back pain. Specifically, no evidence of abdominal aortic aneurysm or dissection. 2.  Aortic Atherosclerosis (ICD10-I70.0). 3. Suspected hemodynamically significant stenosis ease involving the bilateral renal arteries without associated asymmetric renal atrophy or delayed renal enhancement. NON-VASCULAR 1. Colonic diverticulosis without evidence of superimposed acute diverticulitis. 2. Suspected hepatic steatosis. Correlation with LFTs could be performed as clinically indicated. 3. Moderate size hiatal hernia. Electronically Signed   By: Sandi Mariscal M.D.   On: 06/29/2018 18:53    Assessment: 78 y.o. male with PMH of dementia, hypertension, recurrent MSSA UTI, lower back pain admitted for fever, altered mental status and progressive weakness.  He was found to have recurrent MSSA UTI, bacteremia, urinary retention, encephalopathy and quadriplegia.  He is encephalopathy most likely due to UTI and sepsis, meningitis/encephalitis not able to rule out, CSF pending.  ID on board on Rocephin patient clinically improved, now afebrile, WBC trending down and neck pain resolved.  However still has severe quadriplegia, given the history of neck pain, fever, bacteremia, highly concerning for cervical subdural abscess.  Need a stat MRI C-spine with and without contrast for evaluation.  If confirmed, likely need neurosurgery consultation and consider high-dose  steroids.  Continue PT/OT.  Differential diagnoses including GBS and cervical myelopathy due to cervical stenosis, pending LP and C-spine MRI.  Plan: -Status C-spine MRI with and without contrast -If spinal cord compression, need to consider urgent neurosurgery consultation and possible high-dose steroids -Continue antibiotics -PT/OT -We will follow.  Thank you for this consultation and allowing [place] to participate in the care of this patient.  Rosalin Hawking, MD PhD Stroke Neurology 07/27/2018 3:51 PM

## 2018-07-27 NOTE — Progress Notes (Signed)
Patient to dc to [facility] when medically stable.   Ricky Black [place]

## 2018-07-27 NOTE — Progress Notes (Signed)
PT Cancellation Note  Patient Details Name: Ricky Black MRN: [id number] DOB: 10/15/1940   Cancelled Treatment:     Pt out of room for test.   [name]  PTA Acute  Rehabilitation Services Pager      [phone] Office      [phone]

## 2018-07-27 NOTE — Progress Notes (Signed)
   07/27/18 1532  Clinical Encounter Type  Visited With Patient and family together  Visit Type Initial  Referral From Palliative care team  Consult/Referral To Chaplain  The chaplain responded to PMT request for spiritual care.  The chaplain was welcomed into the room by the Pt. wife and daughter.  The family and I exchanged connections about the Pt. local faith community.  The chaplain prayed and offered ongoing pastoral presence as the Pt. and physician team learn more about the Pt. Illness.

## 2018-07-27 NOTE — Progress Notes (Addendum)
[facility]    Date of Admission:  07/22/2018   Total days of antibiotics 6           ID: ESMERALDA BLANFORD is a 78 y.o. male with  MSSA bacteremia/UTI with encephalopathy and quadriplegia Principal Problem:   Sepsis ([place]) Active Problems:   Essential hypertension   Dementia ([place])   Acute encephalopathy   Acute lower UTI   Transaminitis   Neck pain   DNR (do not resuscitate)   Palliative care encounter    Subjective: Patient had some improvement with mental status, more alert, lucency spoke briefly "i'm hungry" to his family members. Still not able to move extremities.  Underwent brain MRI ( but had cervical MR A and not cervical spine )  Formally seen by neurology today; underwent LP by IR- > specimen clotted, CSF Red - xanthochromic and elevated protein.  Medications:  . acetaminophen  650 mg Oral TID  . Chlorhexidine Gluconate Cloth  6 each Topical Q0600  . donepezil  10 mg Oral QHS  . escitalopram  20 mg Oral Daily  . finasteride  5 mg Oral Daily  . irbesartan  150 mg Oral Daily  . liver oil-zinc oxide   Topical QID  . memantine  10 mg Oral BID  . mupirocin ointment  1 application Nasal BID  . pregabalin  75 mg Oral TID  . tamsulosin  0.8 mg Oral QHS    Objective: Vital signs in last 24 hours: Temp:  [97.2 F (36.2 C)-99.8 F (37.7 C)] 99.8 F (37.7 C) (01/08 0546) Pulse Rate:  [77-86] 86 (01/08 0546) Resp:  [20-24] 20 (01/08 1000) BP: (140-148)/(72-80) 140/72 (01/08 1000) SpO2:  [95 %-96 %] 96 % (01/08 0546) Physical Exam  Constitutional: sleeping. He appears well-developed and well-nourished. No distress.  HENT: no nuchal rigidity Mouth/Throat: Oropharynx is clear and moist. No oropharyngeal exudate.  Cardiovascular: Normal rate, regular rhythm and normal heart sounds. Exam reveals no gallop and no friction rub.  No murmur heard.  Pulmonary/Chest: Effort normal and breath sounds normal. No respiratory distress. He has no  wheezes.  Abdominal: Soft. Bowel sounds are normal. He exhibits no distension. There is no tenderness.  Ext: trace edema lower extremities Skin: Skin is warm and dry. No rash noted. No erythema.  Neuro = not assessed since just seen by neurology   Lab Results Recent Labs    07/26/18 0633 07/27/18 1009  WBC 14.4* 12.6*  HGB 11.0* 10.6*  HCT 36.1* 35.2*  NA 147* 145  K 3.6 3.6  CL 114* 113*  CO2 25 24  BUN 27* 40*  CREATININE 0.72 0.75   Liver Panel Recent Labs    07/26/18 0533 07/27/18 1009  PROT 5.6* 5.5*  ALBUMIN 1.7* 1.8*  AST 72* 66*  ALT 87* 61*  ALKPHOS 95 78  BILITOT 0.9 0.6  BILIDIR  --  0.2  IBILI  --  0.4    Microbiology: 1/3 blood cx MSSA 1/3 urine cx MSSA 1/6 blood cx ngtd 1/8 CSF cx pending- gram stain no organism Studies/Results: Dg Neck Soft Tissue  Result Date: 07/26/2018 CLINICAL DATA:  Severe left sided neck pain worsening over last two days; limited range of motion EXAM: NECK SOFT TISSUES - 1+ VIEW COMPARISON:  Neck CT 07/01/2018 FINDINGS: Multilevel disc osteophytic disease with facet hypertrophy. Patient's head is tilted rightward. No acute findings in the cervical spine identified. No prevertebral soft tissue thickening. No abnormality in the oropharynx or hypopharynx  identified. IMPRESSION: No acute findings identified by plain film radiography. Electronically Signed   By: Suzy Bouchard M.D.   On: 07/26/2018 14:17   Mr Jodene Nam Neck W Wo Contrast  Result Date: 07/26/2018 CLINICAL DATA:  Initial evaluation for acute unexplained altered mental status, slowly progressive weakness, neck stiffness. EXAM: MRI HEAD WITHOUT AND WITH CONTRAST MRA NECK WITHOUT AND WITH CONTRAST TECHNIQUE: Multiplanar, multiecho pulse sequences of the brain and surrounding structures were obtained without and with intravenous contrast. Angiographic images of the neck were obtained using MRA technique without and with intravenous contrast. Carotid stenosis measurements (when  applicable) are obtained utilizing NASCET criteria, using the distal internal carotid diameter as the denominator. CONTRAST:  10 cc of Gadavist. COMPARISON:  None available. FINDINGS: MRI HEAD FINDINGS Diffuse prominence of the CSF containing spaces compatible generalized age-related cerebral atrophy. Mild chronic microvascular changes present within the periventricular white matter. No abnormal foci of restricted diffusion to suggest acute or subacute ischemia. Gray-white matter differentiation maintained. No encephalomalacia to suggest chronic infarction. No foci of susceptibility artifact to suggest acute or chronic intracranial hemorrhage. No mass lesion, midline shift or mass effect. No hydrocephalus. No extra-axial fluid collection. Pituitary gland within normal limits. Midline structures intact and normal. No abnormal enhancement. No findings to suggest meningitis or intracranial infection. Hand healed and hand Major intracranial vascular flow voids are well maintained. Craniocervical junction within normal limits. Degenerative spondylolysis at C3-4 with resultant at least moderate spinal stenosis (series 5, image 13). Finding incompletely evaluated on this exam. Bone marrow signal intensity within normal limits. No scalp soft tissue abnormality. Patient status post left-sided ocular lens replacement. Globes and orbital soft tissues demonstrate no acute finding. Mild mucosal thickening within the ethmoidal air cells and maxillary sinuses. Paranasal sinuses are otherwise clear. Trace right mastoid effusion noted, of doubtful significance. MRA NECK FINDINGS Source time-of-flight imaging demonstrates patent antegrade flow within both carotid and vertebral arteries bilaterally. No evidence for arterial dissection on axial T1 fat-sat imaging. Visualized aortic arch of normal caliber with normal branch pattern. No hemodynamically significant stenosis about the origin of the great vessels. Visualized subclavian  arteries widely patent. Right common carotid artery patent from its origin to the bifurcation without stenosis. Atheromatous irregularity at the proximal right ICA with relatively mild 20-25% stenosis by NASCET criteria. Right ICA widely patent distally to the skull base. Left common carotid artery patent from its origin to the bifurcation without stenosis. Short-segment mild approximate 25% stenosis at the origin of the left ICA. Left ICA otherwise widely patent to the skull base. Both of the vertebral arteries arise from the subclavian arteries. Short-segment approximate 60% stenosis at the origin of the right vertebral artery. Relatively mild no more than 20% narrowing at the origin of left vertebral artery. Vertebral arteries otherwise widely patent within the neck without stenosis or occlusion. IMPRESSION: MRI HEAD IMPRESSION: 1. No acute intracranial abnormality. No evidence for intracranial infection. 2. Mild age-related cerebral atrophy with chronic small vessel ischemic disease. MRA NECK IMPRESSION: 1. Negative MRA with no evidence for acute abnormality about the major arterial vasculature of the neck. 2. Atheromatous stenoses about the proximal ICAs bilaterally with associated narrowing of up to approximately 20-25% by NASCET criteria. 3. Short-segment approximate 60% atheromatous stenosis at the origin of the right vertebral artery. Relatively mild no more than 25% narrowing at the origin of the left vertebral artery. Vertebral arteries otherwise widely patent within the neck. Electronically Signed   By: Jeannine Boga M.D.   On: 07/26/2018  23:06   Mr Jeri Cos Wo Contrast  Result Date: 07/26/2018 CLINICAL DATA:  Initial evaluation for acute unexplained altered mental status, slowly progressive weakness, neck stiffness. EXAM: MRI HEAD WITHOUT AND WITH CONTRAST MRA NECK WITHOUT AND WITH CONTRAST TECHNIQUE: Multiplanar, multiecho pulse sequences of the brain and surrounding structures were obtained  without and with intravenous contrast. Angiographic images of the neck were obtained using MRA technique without and with intravenous contrast. Carotid stenosis measurements (when applicable) are obtained utilizing NASCET criteria, using the distal internal carotid diameter as the denominator. CONTRAST:  10 cc of Gadavist. COMPARISON:  None available. FINDINGS: MRI HEAD FINDINGS Diffuse prominence of the CSF containing spaces compatible generalized age-related cerebral atrophy. Mild chronic microvascular changes present within the periventricular white matter. No abnormal foci of restricted diffusion to suggest acute or subacute ischemia. Gray-white matter differentiation maintained. No encephalomalacia to suggest chronic infarction. No foci of susceptibility artifact to suggest acute or chronic intracranial hemorrhage. No mass lesion, midline shift or mass effect. No hydrocephalus. No extra-axial fluid collection. Pituitary gland within normal limits. Midline structures intact and normal. No abnormal enhancement. No findings to suggest meningitis or intracranial infection. Hand healed and hand Major intracranial vascular flow voids are well maintained. Craniocervical junction within normal limits. Degenerative spondylolysis at C3-4 with resultant at least moderate spinal stenosis (series 5, image 13). Finding incompletely evaluated on this exam. Bone marrow signal intensity within normal limits. No scalp soft tissue abnormality. Patient status post left-sided ocular lens replacement. Globes and orbital soft tissues demonstrate no acute finding. Mild mucosal thickening within the ethmoidal air cells and maxillary sinuses. Paranasal sinuses are otherwise clear. Trace right mastoid effusion noted, of doubtful significance. MRA NECK FINDINGS Source time-of-flight imaging demonstrates patent antegrade flow within both carotid and vertebral arteries bilaterally. No evidence for arterial dissection on axial T1 fat-sat  imaging. Visualized aortic arch of normal caliber with normal branch pattern. No hemodynamically significant stenosis about the origin of the great vessels. Visualized subclavian arteries widely patent. Right common carotid artery patent from its origin to the bifurcation without stenosis. Atheromatous irregularity at the proximal right ICA with relatively mild 20-25% stenosis by NASCET criteria. Right ICA widely patent distally to the skull base. Left common carotid artery patent from its origin to the bifurcation without stenosis. Short-segment mild approximate 25% stenosis at the origin of the left ICA. Left ICA otherwise widely patent to the skull base. Both of the vertebral arteries arise from the subclavian arteries. Short-segment approximate 60% stenosis at the origin of the right vertebral artery. Relatively mild no more than 20% narrowing at the origin of left vertebral artery. Vertebral arteries otherwise widely patent within the neck without stenosis or occlusion. IMPRESSION: MRI HEAD IMPRESSION: 1. No acute intracranial abnormality. No evidence for intracranial infection. 2. Mild age-related cerebral atrophy with chronic small vessel ischemic disease. MRA NECK IMPRESSION: 1. Negative MRA with no evidence for acute abnormality about the major arterial vasculature of the neck. 2. Atheromatous stenoses about the proximal ICAs bilaterally with associated narrowing of up to approximately 20-25% by NASCET criteria. 3. Short-segment approximate 60% atheromatous stenosis at the origin of the right vertebral artery. Relatively mild no more than 25% narrowing at the origin of the left vertebral artery. Vertebral arteries otherwise widely patent within the neck. Electronically Signed   By: Jeannine Boga M.D.   On: 07/26/2018 23:06   Dg Fluoro Guided Needle Plc Aspiration/injection Loc  Result Date: 07/27/2018 CLINICAL DATA:  Confusion.  Unable to move arms in  legs. EXAM: DIAGNOSTIC LUMBAR PUNCTURE UNDER  FLUOROSCOPIC GUIDANCE FLUOROSCOPY TIME:  Fluoroscopy Time:  42 seconds Radiation Exposure Index (if provided by the fluoroscopic device): Is 16.2 mGy Number of Acquired Spot Images: 0 PROCEDURE: Informed consent was obtained from the patient prior to the procedure, including potential complications of headache, allergy, and pain. With the patient prone, the lower back was prepped with Betadine. 1% Lidocaine was used for local anesthesia. Lumbar puncture was performed initially at the surgical level at L4-5 using a 20 gauge needle. There was slow return of blood tinged fluid. Opening pressure very low, approximately 12-13 cm of water. Very little fluid could be collected due to the very low pressures despite tilting the head up. Therefore, the needle was repositioned at a 2nd level at L3-4. Same blood tension fluid returned, with same low pressure. Very little fluid could be collected. A total of approximately 1 mL of blood-tinged cloudy fluid was collected. Fluid was sent for laboratory testing. The patient tolerated the procedure well and there were no apparent complications. IMPRESSION: Difficult lumbar puncture under fluoroscopic guidance due to very low pressures. Only a small amount of blood-tinged cloudy fluid could be collected despite tilting head significantly upward. Electronically Signed   By: Rolm Baptise M.D.   On: 07/27/2018 14:47     Assessment/Plan: 78yo M with disseminated staph aureus infection with UTI and bacteremia but concerned that it has seeded to other areas, specifically concern for epidural abscess/meningitis given inability to move extremities and nuchal rigidity. Attempted to have LP which was low OP but commented that CSF was cloudy. Had partial fluid analysis that showed high protein (specimen clotted) - which is concerning for CSF blockage., possibly cervical region given quadriplegia on exam  Had brain mri last night that showed age related changes but not stroke, no abscess,  no septic emboli but did not have cervical mri only MR-A which was some evidence of stenosis but did not visualize cervical spine/epidural region  -it is unclear how long patient has had symptoms of weakness (had them coming into the hospital per family)-imaged on 12/13 which did not show discitis/or epidural absces but now has not been able to move his extremities x 2-3 days in setting of encephalopathy on neuro exam has profound areflexia,   Exam yesterday, patient was solomnent, left left retracted slightly with stimulation, babinski was flat bilaterally. No purposeful movement of arms or legs noted. When first seen on the evening on 1/6, he was alert and sitting up with mild movement of right arm.  MRI of C -spine is pending.For now, continue on ceftriaxone 2gm IV q 12. Spoke to neurology who is communicating with NSGY to evaluate imaging once completed.  [facility] for Infectious Diseases Cell: [phone] Pager: [phone]  07/27/2018, 4:44 PM

## 2018-07-27 NOTE — Progress Notes (Signed)
LCSW spoke with daughter, Claiborne Billings by phone.   LCSW answered questions family had regarding dc'ing to SNF.   LCSW will continue to follow.   Carolin Coy [place]

## 2018-07-27 NOTE — Progress Notes (Signed)
Patient ID: Ricky Black, male   DOB: 05-16-1941, 78 y.o.   MRN: [id number] MRI reviewed...diskitis, osteomyelitis, and moderate epidural abscess behind C2 with mild to moderate amount of cord compression.  Abscess is not amenable to surgery in that location, all we could do would be a posterior decompression and this patients exam is much worse than would be expected with that amount of cord compression so I suspect there may be some ischemia also involved, though don't see evidence of that on the MRI.    I do not feel that surgery is in this patients best interest in light of his dementia, medical condition, and as the chances of recovery of function is minimal to zero.  It is also has a high risk of post operative dysphagia,  ventilator dependence, would healing issues.  In my opinion, and if this were my family member, I would recommend continued IV antibiotics and comfort and supportive care.

## 2018-07-27 NOTE — Consult Note (Signed)
Reason for Consult:quadriplegia Referring Physician: [facility]  ISMEAL HEIDER is an 78 y.o. male.  HPI: 78 year old gentleman with a progressive dementia over the last couple months was seen in the ER in December for generalized weakness confusion diagnosed with a urinary tract infection and discharged. Patient has had a stairstep decline over the last few weeks had a couple falls at home but since January 1 has become progressively more lethargic and sleepy more difficulty ambulating and as of 2 days ago was noted to be completely flaccid quadriplegia. Workup has diagnosed sepsis with methicillin sensitive staph aureus patient is on Rocephin is being taken care of by both neurology and infectious disease. MRI of his brain and hemorrhage gram of his neck were unremarkable patient has an MRI of his cervical spine pending  Past Medical History:  Diagnosis Date  . Abnormal EKG    Prior tracing  with reading can't rule out old septal MI  . Bradycardia   . Dementia ([place])   . Dizziness   . Hypogonadism male   . Neuropathy    (R) Dorsal foot  . OAB (overactive bladder)   . Olecranon bursitis   . Orthostatic hypotension   . Spinal arthritis    ESI    Past Surgical History:  Procedure Laterality Date  . COLONOSCOPY  04/15/2012  . LUMBAR LAMINECTOMY    . TOTAL HIP ARTHROPLASTY     Right  . UMBILICAL HERNIA REPAIR      Family History  Family history unknown: Yes    Social History:  reports that he has quit smoking. He has never used smokeless tobacco. He reports that he does not drink alcohol. No history on file for drug.  Allergies: No Known Allergies  Medications: I have reviewed the patient's current medications.  Results for orders placed or performed during the hospital encounter of 07/22/18 (from the past 48 hour(s))  CBC     Status: Abnormal   Collection Time: 07/26/18  5:33 AM  Result Value Ref Range   WBC 13.9 (H) 4.0 - 10.5 K/uL   RBC 3.47 (L) 4.22 - 5.81 MIL/uL    Hemoglobin 11.4 (L) 13.0 - 17.0 g/dL   HCT 37.4 (L) 39.0 - 52.0 %   MCV 107.8 (H) 80.0 - 100.0 fL   MCH 32.9 26.0 - 34.0 pg   MCHC 30.5 30.0 - 36.0 g/dL   RDW 13.3 11.5 - 15.5 %   Platelets 233 150 - 400 K/uL   nRBC 0.0 0.0 - 0.2 %    Comment: Performed at [facility], [place] [address] [place], [place] [address]  Comprehensive metabolic panel     Status: Abnormal   Collection Time: 07/26/18  5:33 AM  Result Value Ref Range   Sodium 148 (H) 135 - 145 mmol/L   Potassium 3.7 3.5 - 5.1 mmol/L   Chloride 115 (H) 98 - 111 mmol/L   CO2 25 22 - 32 mmol/L   Glucose, Bld 153 (H) 70 - 99 mg/dL   BUN 26 (H) 8 - 23 mg/dL   Creatinine, Ser 0.69 0.61 - 1.24 mg/dL   Calcium 8.4 (L) 8.9 - 10.3 mg/dL   Total Protein 5.6 (L) 6.5 - 8.1 g/dL   Albumin 1.7 (L) 3.5 - 5.0 g/dL   AST 72 (H) 15 - 41 U/L   ALT 87 (H) 0 - 44 U/L   Alkaline Phosphatase 95 38 - 126 U/L   Total Bilirubin 0.9 0.3 - 1.2 mg/dL  GFR calc non Af Amer >60 >60 mL/min   GFR calc Af Amer >60 >60 mL/min   Anion gap 8 5 - 15    Comment: Performed at [facility], [place] [address], [place], [place] [address]  CK     Status: Abnormal   Collection Time: 07/26/18  5:33 AM  Result Value Ref Range   Total CK 48 (L) 49 - 397 U/L    Comment: Performed at [facility], [place] [address] [place], [place] [address]  CBC     Status: Abnormal   Collection Time: 07/26/18  6:33 AM  Result Value Ref Range   WBC 14.4 (H) 4.0 - 10.5 K/uL   RBC 3.33 (L) 4.22 - 5.81 MIL/uL   Hemoglobin 11.0 (L) 13.0 - 17.0 g/dL   HCT 36.1 (L) 39.0 - 52.0 %   MCV 108.4 (H) 80.0 - 100.0 fL   MCH 33.0 26.0 - 34.0 pg   MCHC 30.5 30.0 - 36.0 g/dL   RDW 13.4 11.5 - 15.5 %   Platelets 220 150 - 400 K/uL   nRBC 0.0 0.0 - 0.2 %    Comment: Performed at [facility], [place] [address] [place], [place] [address]  Basic metabolic panel     Status: Abnormal   Collection Time: 07/26/18  6:33 AM   Result Value Ref Range   Sodium 147 (H) 135 - 145 mmol/L   Potassium 3.6 3.5 - 5.1 mmol/L   Chloride 114 (H) 98 - 111 mmol/L   CO2 25 22 - 32 mmol/L   Glucose, Bld 163 (H) 70 - 99 mg/dL   BUN 27 (H) 8 - 23 mg/dL   Creatinine, Ser 0.72 0.61 - 1.24 mg/dL   Calcium 8.4 (L) 8.9 - 10.3 mg/dL   GFR calc non Af Amer >60 >60 mL/min   GFR calc Af Amer >60 >60 mL/min   Anion gap 8 5 - 15    Comment: Performed at [facility], [place] [address], [place], [place] [address]  CBC     Status: Abnormal   Collection Time: 07/27/18 10:09 AM  Result Value Ref Range   WBC 12.6 (H) 4.0 - 10.5 K/uL   RBC 3.20 (L) 4.22 - 5.81 MIL/uL   Hemoglobin 10.6 (L) 13.0 - 17.0 g/dL   HCT 35.2 (L) 39.0 - 52.0 %   MCV 110.0 (H) 80.0 - 100.0 fL   MCH 33.1 26.0 - 34.0 pg   MCHC 30.1 30.0 - 36.0 g/dL   RDW 13.4 11.5 - 15.5 %   Platelets 219 150 - 400 K/uL   nRBC 0.0 0.0 - 0.2 %    Comment: Performed at [facility], [place] [address] [place], [place] [address]  Basic metabolic panel     Status: Abnormal   Collection Time: 07/27/18 10:09 AM  Result Value Ref Range   Sodium 145 135 - 145 mmol/L   Potassium 3.6 3.5 - 5.1 mmol/L   Chloride 113 (H) 98 - 111 mmol/L   CO2 24 22 - 32 mmol/L   Glucose, Bld 221 (H) 70 - 99 mg/dL   BUN 40 (H) 8 - 23 mg/dL   Creatinine, Ser 0.75 0.61 - 1.24 mg/dL   Calcium 8.1 (L) 8.9 - 10.3 mg/dL   GFR calc non Af Amer >60 >60 mL/min   GFR calc Af Amer >60 >60 mL/min   Anion gap 8 5 - 15    Comment: Performed at [facility]  [place] [address], [place], [place] [address]  Hepatic function panel     Status: Abnormal   Collection Time: 07/27/18 10:09 AM  Result Value Ref Range   Total Protein 5.5 (L) 6.5 - 8.1 g/dL   Albumin 1.8 (L) 3.5 - 5.0 g/dL   AST 66 (H) 15 - 41 U/L   ALT 61 (H) 0 - 44 U/L   Alkaline Phosphatase 78 38 - 126 U/L   Total Bilirubin 0.6 0.3 - 1.2 mg/dL   Bilirubin, Direct 0.2 0.0 - 0.2 mg/dL   Indirect Bilirubin  0.4 0.3 - 0.9 mg/dL    Comment: Performed at [facility], [place] [address], [place], [place] [address]  Protein, CSF     Status: Abnormal   Collection Time: 07/27/18  2:29 PM  Result Value Ref Range   Total  Protein, CSF >600 (H) 15 - 45 mg/dL    Comment: RESULTS CONFIRMED BY MANUAL DILUTION Performed at [place] [address], [place], [place] [address]   CSF cell count with differential     Status: Abnormal   Collection Time: 07/27/18  2:29 PM  Result Value Ref Range   Tube # 1    Color, CSF RED (A) COLORLESS   Appearance, CSF HAZY (A) CLEAR   Supernatant XANTHOCHROMIC    RBC Count, CSF UNABLE TO PERFORM COUNT DUE TO CLOT IN SPECIMEN 0 /cu mm   WBC, CSF UNABLE TO PERFORM COUNT DUE TO CLOT IN SPECIMEN 0 - 5 /cu mm   Other Cells, CSF TOO FEW TO COUNT, SMEAR AVAILABLE FOR REVIEW     Comment: RARE LYMPHOCYTES SEEN Performed at [place] [address], [place], [place] [address]   CSF culture     Status: None (Preliminary result)   Collection Time: 07/27/18  2:29 PM  Result Value Ref Range   Specimen Description CSF    Special Requests NONE    Gram Stain      NO WBC SEEN NO ORGANISMS SEEN CYTOSPIN SMEAR Gram Stain Report Called to,Read Back By and Verified With: C.TRAN AT 1559 ON 07/27/18 BY N.THOMPSON Performed at [facility], [place] [address], [place], [place] [address]    Culture PENDING    Report Status PENDING     Dg Neck Soft Tissue  Result Date: 07/26/2018 CLINICAL DATA:  Severe left sided neck pain worsening over last two days; limited range of motion EXAM: NECK SOFT TISSUES - 1+ VIEW COMPARISON:  Neck CT 07/01/2018 FINDINGS: Multilevel disc osteophytic disease with facet hypertrophy. Patient's head is tilted rightward. No acute findings in the cervical spine identified. No prevertebral soft tissue thickening. No abnormality in the oropharynx or hypopharynx identified. IMPRESSION: No acute  findings identified by plain film radiography. Electronically Signed   By: Suzy Bouchard M.D.   On: 07/26/2018 14:17   Mr Jodene Nam Neck W Wo Contrast  Result Date: 07/26/2018 CLINICAL DATA:  Initial evaluation for acute unexplained altered mental status, slowly progressive weakness, neck stiffness. EXAM: MRI HEAD WITHOUT AND WITH CONTRAST MRA NECK WITHOUT AND WITH CONTRAST TECHNIQUE: Multiplanar, multiecho pulse sequences of the brain and surrounding structures were obtained without and with intravenous contrast. Angiographic images of the neck were obtained using MRA technique without and with intravenous contrast. Carotid stenosis measurements (when applicable) are obtained utilizing NASCET criteria, using the distal internal carotid diameter as the denominator. CONTRAST:  10 cc of Gadavist. COMPARISON:  None available. FINDINGS: MRI HEAD FINDINGS  Diffuse prominence of the CSF containing spaces compatible generalized age-related cerebral atrophy. Mild chronic microvascular changes present within the periventricular white matter. No abnormal foci of restricted diffusion to suggest acute or subacute ischemia. Gray-white matter differentiation maintained. No encephalomalacia to suggest chronic infarction. No foci of susceptibility artifact to suggest acute or chronic intracranial hemorrhage. No mass lesion, midline shift or mass effect. No hydrocephalus. No extra-axial fluid collection. Pituitary gland within normal limits. Midline structures intact and normal. No abnormal enhancement. No findings to suggest meningitis or intracranial infection. Hand healed and hand Major intracranial vascular flow voids are well maintained. Craniocervical junction within normal limits. Degenerative spondylolysis at C3-4 with resultant at least moderate spinal stenosis (series 5, image 13). Finding incompletely evaluated on this exam. Bone marrow signal intensity within normal limits. No scalp soft tissue abnormality. Patient status  post left-sided ocular lens replacement. Globes and orbital soft tissues demonstrate no acute finding. Mild mucosal thickening within the ethmoidal air cells and maxillary sinuses. Paranasal sinuses are otherwise clear. Trace right mastoid effusion noted, of doubtful significance. MRA NECK FINDINGS Source time-of-flight imaging demonstrates patent antegrade flow within both carotid and vertebral arteries bilaterally. No evidence for arterial dissection on axial T1 fat-sat imaging. Visualized aortic arch of normal caliber with normal branch pattern. No hemodynamically significant stenosis about the origin of the great vessels. Visualized subclavian arteries widely patent. Right common carotid artery patent from its origin to the bifurcation without stenosis. Atheromatous irregularity at the proximal right ICA with relatively mild 20-25% stenosis by NASCET criteria. Right ICA widely patent distally to the skull base. Left common carotid artery patent from its origin to the bifurcation without stenosis. Short-segment mild approximate 25% stenosis at the origin of the left ICA. Left ICA otherwise widely patent to the skull base. Both of the vertebral arteries arise from the subclavian arteries. Short-segment approximate 60% stenosis at the origin of the right vertebral artery. Relatively mild no more than 20% narrowing at the origin of left vertebral artery. Vertebral arteries otherwise widely patent within the neck without stenosis or occlusion. IMPRESSION: MRI HEAD IMPRESSION: 1. No acute intracranial abnormality. No evidence for intracranial infection. 2. Mild age-related cerebral atrophy with chronic small vessel ischemic disease. MRA NECK IMPRESSION: 1. Negative MRA with no evidence for acute abnormality about the major arterial vasculature of the neck. 2. Atheromatous stenoses about the proximal ICAs bilaterally with associated narrowing of up to approximately 20-25% by NASCET criteria. 3. Short-segment  approximate 60% atheromatous stenosis at the origin of the right vertebral artery. Relatively mild no more than 25% narrowing at the origin of the left vertebral artery. Vertebral arteries otherwise widely patent within the neck. Electronically Signed   By: Jeannine Boga M.D.   On: 07/26/2018 23:06   Mr Jeri Cos FU Contrast  Result Date: 07/26/2018 CLINICAL DATA:  Initial evaluation for acute unexplained altered mental status, slowly progressive weakness, neck stiffness. EXAM: MRI HEAD WITHOUT AND WITH CONTRAST MRA NECK WITHOUT AND WITH CONTRAST TECHNIQUE: Multiplanar, multiecho pulse sequences of the brain and surrounding structures were obtained without and with intravenous contrast. Angiographic images of the neck were obtained using MRA technique without and with intravenous contrast. Carotid stenosis measurements (when applicable) are obtained utilizing NASCET criteria, using the distal internal carotid diameter as the denominator. CONTRAST:  10 cc of Gadavist. COMPARISON:  None available. FINDINGS: MRI HEAD FINDINGS Diffuse prominence of the CSF containing spaces compatible generalized age-related cerebral atrophy. Mild chronic microvascular changes present within the periventricular white matter. No abnormal  foci of restricted diffusion to suggest acute or subacute ischemia. Gray-white matter differentiation maintained. No encephalomalacia to suggest chronic infarction. No foci of susceptibility artifact to suggest acute or chronic intracranial hemorrhage. No mass lesion, midline shift or mass effect. No hydrocephalus. No extra-axial fluid collection. Pituitary gland within normal limits. Midline structures intact and normal. No abnormal enhancement. No findings to suggest meningitis or intracranial infection. Hand healed and hand Major intracranial vascular flow voids are well maintained. Craniocervical junction within normal limits. Degenerative spondylolysis at C3-4 with resultant at least  moderate spinal stenosis (series 5, image 13). Finding incompletely evaluated on this exam. Bone marrow signal intensity within normal limits. No scalp soft tissue abnormality. Patient status post left-sided ocular lens replacement. Globes and orbital soft tissues demonstrate no acute finding. Mild mucosal thickening within the ethmoidal air cells and maxillary sinuses. Paranasal sinuses are otherwise clear. Trace right mastoid effusion noted, of doubtful significance. MRA NECK FINDINGS Source time-of-flight imaging demonstrates patent antegrade flow within both carotid and vertebral arteries bilaterally. No evidence for arterial dissection on axial T1 fat-sat imaging. Visualized aortic arch of normal caliber with normal branch pattern. No hemodynamically significant stenosis about the origin of the great vessels. Visualized subclavian arteries widely patent. Right common carotid artery patent from its origin to the bifurcation without stenosis. Atheromatous irregularity at the proximal right ICA with relatively mild 20-25% stenosis by NASCET criteria. Right ICA widely patent distally to the skull base. Left common carotid artery patent from its origin to the bifurcation without stenosis. Short-segment mild approximate 25% stenosis at the origin of the left ICA. Left ICA otherwise widely patent to the skull base. Both of the vertebral arteries arise from the subclavian arteries. Short-segment approximate 60% stenosis at the origin of the right vertebral artery. Relatively mild no more than 20% narrowing at the origin of left vertebral artery. Vertebral arteries otherwise widely patent within the neck without stenosis or occlusion. IMPRESSION: MRI HEAD IMPRESSION: 1. No acute intracranial abnormality. No evidence for intracranial infection. 2. Mild age-related cerebral atrophy with chronic small vessel ischemic disease. MRA NECK IMPRESSION: 1. Negative MRA with no evidence for acute abnormality about the major  arterial vasculature of the neck. 2. Atheromatous stenoses about the proximal ICAs bilaterally with associated narrowing of up to approximately 20-25% by NASCET criteria. 3. Short-segment approximate 60% atheromatous stenosis at the origin of the right vertebral artery. Relatively mild no more than 25% narrowing at the origin of the left vertebral artery. Vertebral arteries otherwise widely patent within the neck. Electronically Signed   By: Jeannine Boga M.D.   On: 07/26/2018 23:06   Dg Fluoro Guided Needle Plc Aspiration/injection Loc  Result Date: 07/27/2018 CLINICAL DATA:  Confusion.  Unable to move arms in legs. EXAM: DIAGNOSTIC LUMBAR PUNCTURE UNDER FLUOROSCOPIC GUIDANCE FLUOROSCOPY TIME:  Fluoroscopy Time:  42 seconds Radiation Exposure Index (if provided by the fluoroscopic device): Is 16.2 mGy Number of Acquired Spot Images: 0 PROCEDURE: Informed consent was obtained from the patient prior to the procedure, including potential complications of headache, allergy, and pain. With the patient prone, the lower back was prepped with Betadine. 1% Lidocaine was used for local anesthesia. Lumbar puncture was performed initially at the surgical level at L4-5 using a 20 gauge needle. There was slow return of blood tinged fluid. Opening pressure very low, approximately 12-13 cm of water. Very little fluid could be collected due to the very low pressures despite tilting the head up. Therefore, the needle was repositioned at a 2nd level  at L3-4. Same blood tension fluid returned, with same low pressure. Very little fluid could be collected. A total of approximately 1 mL of blood-tinged cloudy fluid was collected. Fluid was sent for laboratory testing. The patient tolerated the procedure well and there were no apparent complications. IMPRESSION: Difficult lumbar puncture under fluoroscopic guidance due to very low pressures. Only a small amount of blood-tinged cloudy fluid could be collected despite tilting head  significantly upward. Electronically Signed   By: Rolm Baptise M.D.   On: 07/27/2018 14:47    Review of Systems  Unable to perform ROS: Dementia   Blood pressure 140/72, pulse 86, temperature 99.8 F (37.7 C), resp. rate 20, height 6\' 2"  (1.88 m), weight 107.5 kg, SpO2 96 %. Physical Exam  Neurological: He is alert.  Patient is awake however confused and nonvocal.  Neurologic exam very difficult secondary to patient's dementia and noncompliance and poor communication. Quadriplegia with 0 out of 5 movement upper and lower extremities and no response to noxious stimulation. Unclear whether he has patchy sensation with inability to communicate however with painful stimulation I did not see any evidence of facial grimace to confirm sensation.    Assessment/Plan: 78 year old gentleman with staph septicemia currently on antibiotic regimen with some improvement in mental status. However there is been no improvement in movement upper and lower extremities where he has a flaccid quadriplegia and appears to be asensate although sensory exam is very difficult to determine his mental status.  And this is now been going on for over 48 hours. With several weeks slow progression.  The prognosis of a 78 year old gentleman with his dementia and medical comorbidities with quadriplegia is extremely grave and very very poor. The MRI of the cervical spine is important for [place] to define the etiology however it is unlikely that we would recommend any surgical intervention as the chances of any type of functional recovery from cervical decompressive surgery in this setting is next to 0. On review of his MRI scan he had of his thoracic spine the beginning of December as well as a CT scan he had of his neck skin known spondylitic disease and cervical stenosis and it's possible that it may not be infection but a spinal cord injury from the falls and his cervical stenosis creating an anterior cord or central cord type syndrome.  I  do not see any role for steroids especially in the setting of sepsis and we will await the MRI scan but expect our goals of care will be comfort care only.  Revia Nghiem P 07/27/2018, 5:58 PM

## 2018-07-27 NOTE — Progress Notes (Signed)
Daily Progress Note   Patient Name: Ricky Black       Date: 07/27/2018 DOB: Jun 15, 1941  Age: 78 y.o. MRN#: [id number] Attending Physician: Georgette Shell, MD Primary Care Physician: Marton Redwood, MD Admit Date: 07/22/2018  Reason for Consultation/Follow-up: Establishing goals of care and Psychosocial/spiritual support  Subjective: Family at bedside while patient is in lumbar puncture.   Per Larene Beach (RN dtr) patient is alert and talking today (at mental baseline).  He wanted to get up and walk but was surprised that his arms and legs would not work.  Neck pain improved but still present.  Patient ate a small amount of oat meal.    Answered questions regarding results of Echo and labs.  MR imaging does not appear to show etiology for symptoms but Neuro consult is pending.   Assessment: Patient in lumbar puncture procedure.     Patient Profile/HPI:  78 y.o. male  with past medical history of arthritis, bradycardia, neuropathy, and moderate dementia who was admitted on 07/22/2018 with sepsis with encephalopathy and MSSA bacteremia.  His extremities are unexpectedly flaccid and he has severe neck pain.  He was noted to be aspirating.  Palliative medicine has been asked to assist with goals of care.   Length of Stay: 5  Current Medications: Scheduled Meds:  . acetaminophen  650 mg Oral TID  . Chlorhexidine Gluconate Cloth  6 each Topical Q0600  . donepezil  10 mg Oral QHS  . escitalopram  20 mg Oral Daily  . finasteride  5 mg Oral Daily  . irbesartan  150 mg Oral Daily  . ketorolac  30 mg Intravenous Q8H  . liver oil-zinc oxide   Topical QID  . memantine  10 mg Oral BID  . mupirocin ointment  1 application Nasal BID  . pregabalin  75 mg Oral TID  . tamsulosin  0.8 mg Oral QHS     Continuous Infusions: . sodium chloride Stopped (07/27/18 1256)  . cefTRIAXone (ROCEPHIN)  IV Stopped (07/27/18 0939)    PRN Meds: acetaminophen, ondansetron **OR** ondansetron (ZOFRAN) IV, senna-docusate  Physical Exam       Patient not examined.  Vital Signs: BP 140/72 (BP Location: Right Arm)   Pulse 86   Temp 99.8 F (37.7 C)   Resp 20   Ht 6\' 2"  (1.88  m)   Wt 107.5 kg   SpO2 96%   BMI 30.43 kg/m  SpO2: SpO2: 96 % O2 Device: O2 Device: Room Air O2 Flow Rate: O2 Flow Rate (L/min): 2 L/min  Intake/output summary:   Intake/Output Summary (Last 24 hours) at 07/27/2018 1326 Last data filed at 07/27/2018 1300 Gross per 24 hour  Intake 1315.34 ml  Output 800 ml  Net 515.34 ml   LBM: Last BM Date: 07/25/18 Baseline Weight: Weight: 107.5 kg Most recent weight: Weight: 107.5 kg       Palliative Assessment/Data: 30%    Flowsheet Rows     Most Recent Value  Intake Tab  Referral Department  Hospitalist  Unit at Time of Referral  Med/Surg Unit  Palliative Care Primary Diagnosis  Sepsis/Infectious Disease  Date Notified  07/25/18  Palliative Care Type  New Palliative care  Reason for referral  Clarify Goals of Care  Date of Admission  07/22/18  # of days IP prior to Palliative referral  3  Clinical Assessment  Psychosocial & Spiritual Assessment  Palliative Care Outcomes      Patient Active Problem List   Diagnosis Date Noted  . Neck pain   . DNR (do not resuscitate)   . Palliative care encounter   . Sepsis ([place]) 07/22/2018  . Acute lower UTI 07/22/2018  . Transaminitis 07/22/2018  . Acute encephalopathy 07/02/2018  . Acute cystitis with hematuria 07/02/2018  . Back pain 07/01/2018  . Delirium 07/01/2018  . Hematuria, microscopic 07/01/2018  . Bradycardia   . Dizziness   . Orthostatic hypotension   . Spinal arthritis   . Neuropathy   . Hypogonadism male   . Dementia ([place])   . Abnormal EKG   . Essential hypertension 05/10/2007  . ARTHRITIS  05/10/2007    Palliative Care Plan    Recommendations/Plan:  Continue current care.    Palliative will follow with you.  Goals of Care and Additional Recommendations:  Limitations on Scope of Treatment: Full Scope Treatment  Code Status:  DNR  Prognosis:   Unable to determine   Discharge Planning:  [place] for rehab with Palliative care service follow-up when medically appropriate.  Care plan was discussed with wife and daughters as well as Dr. Rodena Piety.  Thank you for allowing the Palliative Medicine Team to assist in the care of this patient.  Total time spent:  25 min.     Greater than 50%  of this time was spent counseling and coordinating care related to the above assessment and plan.  Florentina Jenny, PA-C Palliative Medicine  Please contact Palliative MedicineTeam phone at [phone] for questions and concerns between 7 am - 7 pm.   Please see AMION for individual provider pager numbers.

## 2018-07-27 NOTE — Progress Notes (Signed)
Spoke w Dr. Erlinda Hong - EEG to be done tomorrow.

## 2018-07-28 DIAGNOSIS — M4643 Discitis, unspecified, cervicothoracic region: Secondary | ICD-10-CM

## 2018-07-28 DIAGNOSIS — F0280 Dementia in other diseases classified elsewhere without behavioral disturbance: Secondary | ICD-10-CM

## 2018-07-28 DIAGNOSIS — R7881 Bacteremia: Secondary | ICD-10-CM

## 2018-07-28 DIAGNOSIS — N390 Urinary tract infection, site not specified: Secondary | ICD-10-CM

## 2018-07-28 DIAGNOSIS — G8250 Quadriplegia, unspecified: Secondary | ICD-10-CM

## 2018-07-28 DIAGNOSIS — A4901 Methicillin susceptible Staphylococcus aureus infection, unspecified site: Secondary | ICD-10-CM

## 2018-07-28 DIAGNOSIS — M8628 Subacute osteomyelitis, other site: Secondary | ICD-10-CM

## 2018-07-28 DIAGNOSIS — Z960 Presence of urogenital implants: Secondary | ICD-10-CM

## 2018-07-28 DIAGNOSIS — G061 Intraspinal abscess and granuloma: Secondary | ICD-10-CM

## 2018-07-28 DIAGNOSIS — M4622 Osteomyelitis of vertebra, cervical region: Secondary | ICD-10-CM

## 2018-07-28 DIAGNOSIS — M4642 Discitis, unspecified, cervical region: Secondary | ICD-10-CM

## 2018-07-28 MED ORDER — HYDROCODONE-ACETAMINOPHEN 7.5-325 MG/15ML PO SOLN
15.0000 mL | ORAL | Status: DC | PRN
  Administered 2018-07-29: 15 mL via ORAL
  Filled 2018-07-28: qty 15

## 2018-07-28 MED ORDER — HYDROCODONE-ACETAMINOPHEN 10-325 MG PO TABS
1.0000 | ORAL_TABLET | ORAL | Status: DC | PRN
  Administered 2018-07-28: 1 via ORAL
  Filled 2018-07-28: qty 1

## 2018-07-28 MED ORDER — MORPHINE SULFATE (PF) 2 MG/ML IV SOLN: 2.0000 mg | INTRAVENOUS | Status: DC | PRN

## 2018-07-28 NOTE — Progress Notes (Signed)
PROGRESS NOTE    LLOYD AYO  TIR:[id number] DOB: 10-May-1941 DOA: 07/22/2018 PCP: Marton Redwood, MD   Brief Narrative: 78 y.o.Mwith hx dementia, previously home dwelling, HTN, and 2 recent admissions for MSSA UTIwho presents with slow progressive weakness.  Caveat that patient is unable to provide any history, which is collected from wife at bedside instead.   The patient was discharged mid-December for UTI, MSSA, discharged on nitrofurantoin. He was seeming to do better for a time, ambulating short distances, but then in the last 1-2 weeks has started to decline again, no longer getting out of bed, weaker and weaker, less responsive until today he had a fever and was sent to the ER. There has been no cough or respiratory distress. There is been no obvious urinary symptoms. No vomiting or reported abdominal pain. ED course: -Temp 101F, heart rate 91, respirations 31,blood pressure 130/79 -Na139, K4.0, Cr0.9, WBC13.3K, Hgb13 -Transaminases 60s, Tbili 1.4 -INR 1.1 -Lactic acid 1.7 -UA showed many RBCs and WBCs -CXR showed no pneumonia or effusion -CT head unremarkable  Assessment & Plan:   Principal Problem:   Sepsis ([facility]) Active Problems:   Essential hypertension   Dementia (HCC)   Acute encephalopathy   Acute lower UTI   Transaminitis   Neck pain   DNR (do not resuscitate)   Palliative care encounter   #1 epidural abscess/C3-C4 osteomyelitis/discitis-patient is not able to move his hands or legs.  He is more awake today and appropriately answering questions today.  Appreciate neurosurgery input from urology input and ID input.  Family to meet with palliative care today at 3:00 to decide on comfort care. Discussed in detail with patient's daughter and his wife.    #2 MSSA bacteremia continue Rocephin PICC line on hold at this time while family deciding about comfort care.  #3 acute metabolic encephalopathy secondary to bacteremia.    Estimated body mass  index is 30.43 kg/m as calculated from the following:   Height as of this encounter: 6\' 2"  (1.88 m).   Weight as of this encounter: 107.5 kg.  DVT prophylaxis: Lovenox Code Status DO NOT RESUSCITATE Family Communication: Discussed with family Disposition Plan: Pending palliative care meeting Consultants: Neurology neurosurgery infectious disease palliative care  Procedures: Lumbar puncture Antimicrobials Rocephin  Subjective: When I walked in this morning he asked me where you well this time he said he is hungry he answered my questions appropriately.  However he is not able to move his upper or lower extremities.  Objective: Vitals:   07/27/18 0546 07/27/18 1000 07/27/18 2033 07/28/18 0453  BP: (!) 148/78 140/72 (!) 157/69 (!) 155/79  Pulse: 86  78 77  Resp: (!) 24 20 20  (!) 22  Temp: 99.8 F (37.7 C)  98.8 F (37.1 C) 97.9 F (36.6 C)  TempSrc:      SpO2: 96%  94% 96%  Weight:      Height:        Intake/Output Summary (Last 24 hours) at 07/28/2018 1401 Last data filed at 07/28/2018 0600 Gross per 24 hour  Intake 728.83 ml  Output -  Net 728.83 ml   Filed Weights   07/22/18 1232  Weight: 107.5 kg    Examination:  General exam: Appears calm and comfortable  Respiratory system: Clear to auscultation. Respiratory effort normal. Cardiovascular system: S1 & S2 heard, RRR. No JVD, murmurs, rubs, gallops or clicks. No pedal edema. Gastrointestinal system: Abdomen is nondistended, soft and nontender. No organomegaly or masses felt. Normal bowel sounds heard.  Central nervous system: Awake oriented to hospital 0 x 5 muscle strength upper and lower extremities with diminished reflexes.   Extremities: 1+  edema Skin: No rashes, lesions or ulcers Psychiatry: Judgement and insight appear normal. Mood & affect appropriate.     Data Reviewed: I have personally reviewed following labs and imaging studies  CBC: Recent Labs  Lab 07/22/18 1005 07/23/18 0658 07/26/18 0533  07/26/18 0633 07/27/18 1009  WBC 13.3* 11.8* 13.9* 14.4* 12.6*  NEUTROABS 12.3*  --   --   --   --   HGB 13.0 11.4* 11.4* 11.0* 10.6*  HCT 40.3 36.3* 37.4* 36.1* 35.2*  MCV 103.6* 104.6* 107.8* 108.4* 110.0*  PLT 201 180 233 220 893   Basic Metabolic Panel: Recent Labs  Lab 07/22/18 1005 07/23/18 0658 07/26/18 0533 07/26/18 0633 07/27/18 1009  NA 139 144 148* 147* 145  K 4.0 3.6 3.7 3.6 3.6  CL 100 110 115* 114* 113*  CO2 24 23 25 25 24   GLUCOSE 190* 194* 153* 163* 221*  BUN 46* 43* 26* 27* 40*  CREATININE 0.94 0.81 0.69 0.72 0.75  CALCIUM 9.0 8.3* 8.4* 8.4* 8.1*   GFR: Estimated Creatinine Clearance: 97.7 mL/min (by C-G formula based on SCr of 0.75 mg/dL). Liver Function Tests: Recent Labs  Lab 07/22/18 1005 07/23/18 0658 07/26/18 0533 07/27/18 1009  AST 63* 71* 72* 66*  ALT 63* 64* 87* 61*  ALKPHOS 107 83 95 78  BILITOT 1.4* 0.5 0.9 0.6  PROT 7.3 6.3* 5.6* 5.5*  ALBUMIN 2.7* 2.1* 1.7* 1.8*   No results for input(s): LIPASE, AMYLASE in the last 168 hours. No results for input(s): AMMONIA in the last 168 hours. Coagulation Profile: Recent Labs  Lab 07/22/18 1005  INR 1.19   Cardiac Enzymes: Recent Labs  Lab 07/26/18 0533  CKTOTAL 48*   BNP (last 3 results) No results for input(s): PROBNP in the last 8760 hours. HbA1C: No results for input(s): HGBA1C in the last 72 hours. CBG: No results for input(s): GLUCAP in the last 168 hours. Lipid Profile: No results for input(s): CHOL, HDL, LDLCALC, TRIG, CHOLHDL, LDLDIRECT in the last 72 hours. Thyroid Function Tests: Recent Labs    07/25/18 1502  TSH 1.418   Anemia Panel: No results for input(s): VITAMINB12, FOLATE, FERRITIN, TIBC, IRON, RETICCTPCT in the last 72 hours. Sepsis Labs: Recent Labs  Lab 07/22/18 1012 07/22/18 1155  LATICACIDVEN 1.72 1.35    Recent Results (from the past 240 hour(s))  Culture, blood (Routine x 2)     Status: Abnormal   Collection Time: 07/22/18 10:05 AM  Result  Value Ref Range Status   Specimen Description BLOOD BLOOD RIGHT FOREARM  Final   Special Requests   Final    BOTTLES DRAWN AEROBIC AND ANAEROBIC Blood Culture adequate volume Performed at [facility] [address], [place], [place] [address]    Culture  Setup Time   Final    IN BOTH AEROBIC AND ANAEROBIC BOTTLES GRAM POSITIVE COCCI CRITICAL RESULT CALLED TO, READ BACK BY AND VERIFIED WITHLavell Luster PHARMD [id number] 07/23/18 A BROWNING    Culture STAPHYLOCOCCUS AUREUS (A)  Final   Report Status 07/25/2018 FINAL  Final   Organism ID, Bacteria STAPHYLOCOCCUS AUREUS  Final      Susceptibility   Staphylococcus aureus - MIC*    CIPROFLOXACIN <=0.5 SENSITIVE Sensitive     ERYTHROMYCIN <=0.25 SENSITIVE Sensitive     GENTAMICIN <=0.5 SENSITIVE Sensitive     OXACILLIN <=0.25 SENSITIVE Sensitive  TETRACYCLINE <=1 SENSITIVE Sensitive     VANCOMYCIN <=0.5 SENSITIVE Sensitive     TRIMETH/SULFA 160 RESISTANT Resistant     CLINDAMYCIN <=0.25 SENSITIVE Sensitive     RIFAMPIN <=0.5 SENSITIVE Sensitive     Inducible Clindamycin NEGATIVE Sensitive     * STAPHYLOCOCCUS AUREUS  Blood Culture ID Panel (Reflexed)     Status: Abnormal   Collection Time: 07/22/18 10:05 AM  Result Value Ref Range Status   Enterococcus species NOT DETECTED NOT DETECTED Final   Listeria monocytogenes NOT DETECTED NOT DETECTED Final   Staphylococcus species DETECTED (A) NOT DETECTED Final    Comment: CRITICAL RESULT CALLED TO, READ BACK BY AND VERIFIED WITHLavell Luster PHARMD [id number] 07/23/18 A BROWNING    Staphylococcus aureus (BCID) DETECTED (A) NOT DETECTED Final    Comment: Methicillin (oxacillin) susceptible Staphylococcus aureus (MSSA). Preferred therapy is anti staphylococcal beta lactam antibiotic (Cefazolin or Nafcillin), unless clinically contraindicated. CRITICAL RESULT CALLED TO, READ BACK BY AND VERIFIED WITHLavell Luster PHARMD [id number] 07/23/18 A BROWNING    Methicillin resistance NOT DETECTED NOT  DETECTED Final   Streptococcus species NOT DETECTED NOT DETECTED Final   Streptococcus agalactiae NOT DETECTED NOT DETECTED Final   Streptococcus pneumoniae NOT DETECTED NOT DETECTED Final   Streptococcus pyogenes NOT DETECTED NOT DETECTED Final   Acinetobacter baumannii NOT DETECTED NOT DETECTED Final   Enterobacteriaceae species NOT DETECTED NOT DETECTED Final   Enterobacter cloacae complex NOT DETECTED NOT DETECTED Final   Escherichia coli NOT DETECTED NOT DETECTED Final   Klebsiella oxytoca NOT DETECTED NOT DETECTED Final   Klebsiella pneumoniae NOT DETECTED NOT DETECTED Final   Proteus species NOT DETECTED NOT DETECTED Final   Serratia marcescens NOT DETECTED NOT DETECTED Final   Haemophilus influenzae NOT DETECTED NOT DETECTED Final   Neisseria meningitidis NOT DETECTED NOT DETECTED Final   Pseudomonas aeruginosa NOT DETECTED NOT DETECTED Final   Candida albicans NOT DETECTED NOT DETECTED Final   Candida glabrata NOT DETECTED NOT DETECTED Final   Candida krusei NOT DETECTED NOT DETECTED Final   Candida parapsilosis NOT DETECTED NOT DETECTED Final   Candida tropicalis NOT DETECTED NOT DETECTED Final    Comment: Performed at [facility], [place] [address] [place], [place] [address]  Culture, blood (Routine x 2)     Status: Abnormal   Collection Time: 07/22/18 11:45 AM  Result Value Ref Range Status   Specimen Description   Final    BLOOD LEFT ANTECUBITAL Performed at [facility] [address] [place], [place] [address]    Special Requests   Final    BOTTLES DRAWN AEROBIC AND ANAEROBIC Blood Culture adequate volume Performed at [facility] [address] [place], [place] [address]    Culture  Setup Time   Final    GRAM POSITIVE COCCI IN BOTH AEROBIC AND ANAEROBIC BOTTLES CRITICAL VALUE NOTED.  VALUE IS CONSISTENT WITH PREVIOUSLY REPORTED AND CALLED VALUE. Performed at [facility], [place] [address] [place], [place]  [address]    Culture (A)  Final    STAPHYLOCOCCUS AUREUS SUSCEPTIBILITIES PERFORMED ON PREVIOUS CULTURE WITHIN THE LAST 5 DAYS.    Report Status 07/25/2018 FINAL  Final  Urine culture     Status: Abnormal   Collection Time: 07/22/18 12:36 PM  Result Value Ref Range Status   Specimen Description   Final    URINE, CATHETERIZED Performed at [facility], [place] [address] [place], [place] [address]    Special  Requests   Final    Normal Performed at [facility], [place] [address] [place], [place] [address]    Culture >=100,000 COLONIES/mL STAPHYLOCOCCUS AUREUS (A)  Final   Report Status 07/24/2018 FINAL  Final   Organism ID, Bacteria STAPHYLOCOCCUS AUREUS (A)  Final      Susceptibility   Staphylococcus aureus - MIC*    CIPROFLOXACIN <=0.5 SENSITIVE Sensitive     GENTAMICIN <=0.5 SENSITIVE Sensitive     NITROFURANTOIN <=16 SENSITIVE Sensitive     OXACILLIN <=0.25 SENSITIVE Sensitive     TETRACYCLINE <=1 SENSITIVE Sensitive     VANCOMYCIN <=0.5 SENSITIVE Sensitive     TRIMETH/SULFA 80 RESISTANT Resistant     CLINDAMYCIN <=0.25 SENSITIVE Sensitive     RIFAMPIN <=0.5 SENSITIVE Sensitive     Inducible Clindamycin NEGATIVE Sensitive     * >=100,000 COLONIES/mL STAPHYLOCOCCUS AUREUS  MRSA PCR Screening     Status: Abnormal   Collection Time: 07/23/18  2:03 AM  Result Value Ref Range Status   MRSA by PCR POSITIVE (A) NEGATIVE Final    Comment:        The GeneXpert MRSA Assay (FDA approved for NASAL specimens only), is one component of a comprehensive MRSA colonization surveillance program. It is not intended to diagnose MRSA infection nor to guide or monitor treatment for MRSA infections. RESULT CALLED TO, READ BACK BY AND VERIFIED WITH: HILL,D RN @0353  ON 07/23/18 JACKSON,K Performed at [facility], [place] [address], [place], [place] [address]   Culture, blood (routine x 2)     Status: None (Preliminary result)   Collection  Time: 07/25/18  5:04 PM  Result Value Ref Range Status   Specimen Description   Final    BLOOD LEFT HAND Performed at [facility] [address] [place], [place] [address]    Special Requests   Final    BOTTLES DRAWN AEROBIC AND ANAEROBIC Blood Culture adequate volume Performed at [facility] [address], [place], [place] [address]    Culture   Final    NO GROWTH 3 DAYS Performed at [facility], [place] [address], [place], [place] [address]    Report Status PENDING  Incomplete  Culture, blood (routine x 2)     Status: None (Preliminary result)   Collection Time: 07/25/18  5:21 PM  Result Value Ref Range Status   Specimen Description   Final    BLOOD LEFT HAND Performed at [facility] [address], [place], [place] [address]    Special Requests   Final    BOTTLES DRAWN AEROBIC AND ANAEROBIC Blood Culture adequate volume Performed at [facility] [address], [place], [place] [address]    Culture   Final    NO GROWTH 3 DAYS Performed at [facility], [place] [address], [place], [place] [address]    Report Status PENDING  Incomplete  CSF culture     Status: None (Preliminary result)   Collection Time: 07/27/18  2:29 PM  Result Value Ref Range Status   Specimen Description   Final    CSF Performed at [facility] [address], [place], [place] [address]    Special Requests   Final    NONE Performed at [facility], [place] [address] [place], [place] [address]    Gram Stain   Final    NO WBC SEEN NO ORGANISMS SEEN CYTOSPIN SMEAR Gram Stain  Report Called to,Read Back By and Verified With: C.TRAN AT 1559 ON 07/27/18 BY N.THOMPSON Performed at [facility], [place] [address] [place], [place] [address]    Culture   Final    NO GROWTH < 24 HOURS Performed at [facility] [address] [place], [place] [address]     Report Status PENDING  Incomplete         Radiology Studies: Mr Jodene Nam Neck W Wo Contrast  Result Date: 07/26/2018 CLINICAL DATA:  Initial evaluation for acute unexplained altered mental status, slowly progressive weakness, neck stiffness. EXAM: MRI HEAD WITHOUT AND WITH CONTRAST MRA NECK WITHOUT AND WITH CONTRAST TECHNIQUE: Multiplanar, multiecho pulse sequences of the brain and surrounding structures were obtained without and with intravenous contrast. Angiographic images of the neck were obtained using MRA technique without and with intravenous contrast. Carotid stenosis measurements (when applicable) are obtained utilizing NASCET criteria, using the distal internal carotid diameter as the denominator. CONTRAST:  10 cc of Gadavist. COMPARISON:  None available. FINDINGS: MRI HEAD FINDINGS Diffuse prominence of the CSF containing spaces compatible generalized age-related cerebral atrophy. Mild chronic microvascular changes present within the periventricular white matter. No abnormal foci of restricted diffusion to suggest acute or subacute ischemia. Gray-white matter differentiation maintained. No encephalomalacia to suggest chronic infarction. No foci of susceptibility artifact to suggest acute or chronic intracranial hemorrhage. No mass lesion, midline shift or mass effect. No hydrocephalus. No extra-axial fluid collection. Pituitary gland within normal limits. Midline structures intact and normal. No abnormal enhancement. No findings to suggest meningitis or intracranial infection. Hand healed and hand Major intracranial vascular flow voids are well maintained. Craniocervical junction within normal limits. Degenerative spondylolysis at C3-4 with resultant at least moderate spinal stenosis (series 5, image 13). Finding incompletely evaluated on this exam. Bone marrow signal intensity within normal limits. No scalp soft tissue abnormality. Patient status post left-sided ocular lens replacement. Globes and  orbital soft tissues demonstrate no acute finding. Mild mucosal thickening within the ethmoidal air cells and maxillary sinuses. Paranasal sinuses are otherwise clear. Trace right mastoid effusion noted, of doubtful significance. MRA NECK FINDINGS Source time-of-flight imaging demonstrates patent antegrade flow within both carotid and vertebral arteries bilaterally. No evidence for arterial dissection on axial T1 fat-sat imaging. Visualized aortic arch of normal caliber with normal branch pattern. No hemodynamically significant stenosis about the origin of the great vessels. Visualized subclavian arteries widely patent. Right common carotid artery patent from its origin to the bifurcation without stenosis. Atheromatous irregularity at the proximal right ICA with relatively mild 20-25% stenosis by NASCET criteria. Right ICA widely patent distally to the skull base. Left common carotid artery patent from its origin to the bifurcation without stenosis. Short-segment mild approximate 25% stenosis at the origin of the left ICA. Left ICA otherwise widely patent to the skull base. Both of the vertebral arteries arise from the subclavian arteries. Short-segment approximate 60% stenosis at the origin of the right vertebral artery. Relatively mild no more than 20% narrowing at the origin of left vertebral artery. Vertebral arteries otherwise widely patent within the neck without stenosis or occlusion. IMPRESSION: MRI HEAD IMPRESSION: 1. No acute intracranial abnormality. No evidence for intracranial infection. 2. Mild age-related cerebral atrophy with chronic small vessel ischemic disease. MRA NECK IMPRESSION: 1. Negative MRA with no evidence for acute abnormality about the major arterial vasculature of the neck. 2. Atheromatous stenoses about the proximal ICAs bilaterally with associated narrowing of up to approximately 20-25% by NASCET criteria. 3. Short-segment  approximate 60% atheromatous stenosis at the origin of the  right vertebral artery. Relatively mild no more than 25% narrowing at the origin of the left vertebral artery. Vertebral arteries otherwise widely patent within the neck. Electronically Signed   By: Jeannine Boga M.D.   On: 07/26/2018 23:06   Mr Jeri Cos RX Contrast  Result Date: 07/26/2018 CLINICAL DATA:  Initial evaluation for acute unexplained altered mental status, slowly progressive weakness, neck stiffness. EXAM: MRI HEAD WITHOUT AND WITH CONTRAST MRA NECK WITHOUT AND WITH CONTRAST TECHNIQUE: Multiplanar, multiecho pulse sequences of the brain and surrounding structures were obtained without and with intravenous contrast. Angiographic images of the neck were obtained using MRA technique without and with intravenous contrast. Carotid stenosis measurements (when applicable) are obtained utilizing NASCET criteria, using the distal internal carotid diameter as the denominator. CONTRAST:  10 cc of Gadavist. COMPARISON:  None available. FINDINGS: MRI HEAD FINDINGS Diffuse prominence of the CSF containing spaces compatible generalized age-related cerebral atrophy. Mild chronic microvascular changes present within the periventricular white matter. No abnormal foci of restricted diffusion to suggest acute or subacute ischemia. Gray-white matter differentiation maintained. No encephalomalacia to suggest chronic infarction. No foci of susceptibility artifact to suggest acute or chronic intracranial hemorrhage. No mass lesion, midline shift or mass effect. No hydrocephalus. No extra-axial fluid collection. Pituitary gland within normal limits. Midline structures intact and normal. No abnormal enhancement. No findings to suggest meningitis or intracranial infection. Hand healed and hand Major intracranial vascular flow voids are well maintained. Craniocervical junction within normal limits. Degenerative spondylolysis at C3-4 with resultant at least moderate spinal stenosis (series 5, image 13). Finding  incompletely evaluated on this exam. Bone marrow signal intensity within normal limits. No scalp soft tissue abnormality. Patient status post left-sided ocular lens replacement. Globes and orbital soft tissues demonstrate no acute finding. Mild mucosal thickening within the ethmoidal air cells and maxillary sinuses. Paranasal sinuses are otherwise clear. Trace right mastoid effusion noted, of doubtful significance. MRA NECK FINDINGS Source time-of-flight imaging demonstrates patent antegrade flow within both carotid and vertebral arteries bilaterally. No evidence for arterial dissection on axial T1 fat-sat imaging. Visualized aortic arch of normal caliber with normal branch pattern. No hemodynamically significant stenosis about the origin of the great vessels. Visualized subclavian arteries widely patent. Right common carotid artery patent from its origin to the bifurcation without stenosis. Atheromatous irregularity at the proximal right ICA with relatively mild 20-25% stenosis by NASCET criteria. Right ICA widely patent distally to the skull base. Left common carotid artery patent from its origin to the bifurcation without stenosis. Short-segment mild approximate 25% stenosis at the origin of the left ICA. Left ICA otherwise widely patent to the skull base. Both of the vertebral arteries arise from the subclavian arteries. Short-segment approximate 60% stenosis at the origin of the right vertebral artery. Relatively mild no more than 20% narrowing at the origin of left vertebral artery. Vertebral arteries otherwise widely patent within the neck without stenosis or occlusion. IMPRESSION: MRI HEAD IMPRESSION: 1. No acute intracranial abnormality. No evidence for intracranial infection. 2. Mild age-related cerebral atrophy with chronic small vessel ischemic disease. MRA NECK IMPRESSION: 1. Negative MRA with no evidence for acute abnormality about the major arterial vasculature of the neck. 2. Atheromatous stenoses  about the proximal ICAs bilaterally with associated narrowing of up to approximately 20-25% by NASCET criteria. 3. Short-segment approximate 60% atheromatous stenosis at the origin of the right vertebral artery. Relatively mild no more than 25% narrowing at the origin of  the left vertebral artery. Vertebral arteries otherwise widely patent within the neck. Electronically Signed   By: Jeannine Boga M.D.   On: 07/26/2018 23:06   Mr Cervical Spine W Wo Contrast  Result Date: 07/27/2018 CLINICAL DATA:  Quadriplegia for greater than 48 hours. No sensory response below the neck. Sepsis with methicillin sensitive Staph aureus. Multiple falls prior to quadriplegia. Dementia. EXAM: MRI CERVICAL AND THORACIC SPINE WITHOUT AND WITH CONTRAST TECHNIQUE: Multiplanar and multiecho pulse sequences of the cervical spine, to include the craniocervical junction and cervicothoracic junction, and thoracic spine, were obtained without and with intravenous contrast. CONTRAST:  Gadavist 10 mL. COMPARISON:  CT cervical spine 07/01/2018. MRI thoracic spine 07/01/2018. MRI lumbar spine 07/01/2018. MRA extracranial and intracranial 07/26/2018. FINDINGS: The patient was unable to remain motionless for the exam. Small or subtle lesions could be overlooked. Yo MRI CERVICAL SPINE FINDINGS Alignment: Anatomic Vertebrae: Congenital C2-C3 fusion/assimilation. Discal hyperintensity at C3-4 with subtle enhancement involving the C3 and C4 vertebral bodies suggesting osteomyelitis/discitis. Cord: The cord is contacted, and slightly compressed, by a ventral epidural abscess epicenter C2-C3, measuring 12 x 5 x 22 mm. The cord is equivocally hyperintense opposite C2 and C3; see sagittal STIR series 4, image 7. There may be a second small epidural abscess in the ventral epidural space opposite C6, with mild to moderate cord flattening related to spondylotic ridging at C5-C6 but no clear abnormality of cord signal. Posterior Fossa, vertebral  arteries, paraspinal tissues: No tonsillar herniation. Prevertebral enhancement extends from the foramen magnum to C6 representing retropharyngeal phlegmon. Features of retropharyngeal abscess extend from C3 through C5. Disc levels: C2-3: Rudimentary. Stenosis with cord flattening secondary to epidural abscess. C3-4: Suspected discitis and regional osteomyelitis. Osseous ridging results in canal stenosis and cord flattening. Prevertebral retropharyngeal abscess. C4-5: Spondylotic ridging contributes to mild stenosis and foraminal narrowing. C5-6: Discal hyperintensity in the setting of severe loss of disc height could represent additional disk infection. No visible adjacent osteomyelitis. Osseous ridging contributes to stenosis, below which there is possible additional small epidural abscess. C6-7:  Disc space narrowing, annular bulge. C7-T1:  Unremarkable. THORACIC SPINE FINDINGS Segmentation:  Standard Alignment:  Physiologic Vertebrae: No fracture. Subtle endplate hyperintensity superiorly at T12 associated with slight abnormal T11-T12 discal hyperintensity, concerning for early T12 osteomyelitis. These changes were not present previously. Cord: No abnormal cord signal. Paraspinal and other soft tissues: Dependent atelectasis versus consolidation RIGHT lower lobe. Disc levels: Disc spaces are unremarkable or display only mild spondylosis except for T11-12, mildly hyperintense, concerning for early discitis. IMPRESSION: MRI CERVICAL SPINE: Suspected C3-4 diskitis, C3 and C4 osteomyelitis, 12 x 5 x 22 mm C2-3 ventral epidural abscess, as well as extensive retropharyngeal phlegmon and abscess. Possible increased cord signal at C2 and C3, but no features to suggest posttraumatic cord contusion. Mild cord flattening at this level related to the adjacent epidural abscess. Multilevel spondylosis with areas of stenosis due to osseous ridging. Small ventral epidural collection at C5-6 not clearly compressive. MRI THORACIC  SPINE: No thoracic epidural abscess. T11-T12 discal hyperintensity and abnormal edema in the T12 vertebral body, could represent early discitis and osteomyelitis. Dependent atelectasis versus consolidation RIGHT lower lobe. Chest radiograph recommended. A call has been placed to the ordering provider. Electronically Signed   By: Staci Righter M.D.   On: 07/27/2018 21:05   Mr Thoracic Spine W Wo Contrast  Result Date: 07/27/2018 CLINICAL DATA:  Quadriplegia for greater than 48 hours. No sensory response below the neck. Sepsis with methicillin sensitive Staph aureus.  Multiple falls prior to quadriplegia. Dementia. EXAM: MRI CERVICAL AND THORACIC SPINE WITHOUT AND WITH CONTRAST TECHNIQUE: Multiplanar and multiecho pulse sequences of the cervical spine, to include the craniocervical junction and cervicothoracic junction, and thoracic spine, were obtained without and with intravenous contrast. CONTRAST:  Gadavist 10 mL. COMPARISON:  CT cervical spine 07/01/2018. MRI thoracic spine 07/01/2018. MRI lumbar spine 07/01/2018. MRA extracranial and intracranial 07/26/2018. FINDINGS: The patient was unable to remain motionless for the exam. Small or subtle lesions could be overlooked. Yo MRI CERVICAL SPINE FINDINGS Alignment: Anatomic Vertebrae: Congenital C2-C3 fusion/assimilation. Discal hyperintensity at C3-4 with subtle enhancement involving the C3 and C4 vertebral bodies suggesting osteomyelitis/discitis. Cord: The cord is contacted, and slightly compressed, by a ventral epidural abscess epicenter C2-C3, measuring 12 x 5 x 22 mm. The cord is equivocally hyperintense opposite C2 and C3; see sagittal STIR series 4, image 7. There may be a second small epidural abscess in the ventral epidural space opposite C6, with mild to moderate cord flattening related to spondylotic ridging at C5-C6 but no clear abnormality of cord signal. Posterior Fossa, vertebral arteries, paraspinal tissues: No tonsillar herniation. Prevertebral  enhancement extends from the foramen magnum to C6 representing retropharyngeal phlegmon. Features of retropharyngeal abscess extend from C3 through C5. Disc levels: C2-3: Rudimentary. Stenosis with cord flattening secondary to epidural abscess. C3-4: Suspected discitis and regional osteomyelitis. Osseous ridging results in canal stenosis and cord flattening. Prevertebral retropharyngeal abscess. C4-5: Spondylotic ridging contributes to mild stenosis and foraminal narrowing. C5-6: Discal hyperintensity in the setting of severe loss of disc height could represent additional disk infection. No visible adjacent osteomyelitis. Osseous ridging contributes to stenosis, below which there is possible additional small epidural abscess. C6-7:  Disc space narrowing, annular bulge. C7-T1:  Unremarkable. THORACIC SPINE FINDINGS Segmentation:  Standard Alignment:  Physiologic Vertebrae: No fracture. Subtle endplate hyperintensity superiorly at T12 associated with slight abnormal T11-T12 discal hyperintensity, concerning for early T12 osteomyelitis. These changes were not present previously. Cord: No abnormal cord signal. Paraspinal and other soft tissues: Dependent atelectasis versus consolidation RIGHT lower lobe. Disc levels: Disc spaces are unremarkable or display only mild spondylosis except for T11-12, mildly hyperintense, concerning for early discitis. IMPRESSION: MRI CERVICAL SPINE: Suspected C3-4 diskitis, C3 and C4 osteomyelitis, 12 x 5 x 22 mm C2-3 ventral epidural abscess, as well as extensive retropharyngeal phlegmon and abscess. Possible increased cord signal at C2 and C3, but no features to suggest posttraumatic cord contusion. Mild cord flattening at this level related to the adjacent epidural abscess. Multilevel spondylosis with areas of stenosis due to osseous ridging. Small ventral epidural collection at C5-6 not clearly compressive. MRI THORACIC SPINE: No thoracic epidural abscess. T11-T12 discal hyperintensity  and abnormal edema in the T12 vertebral body, could represent early discitis and osteomyelitis. Dependent atelectasis versus consolidation RIGHT lower lobe. Chest radiograph recommended. A call has been placed to the ordering provider. Electronically Signed   By: Staci Righter M.D.   On: 07/27/2018 21:05   Dg Fluoro Guided Needle Plc Aspiration/injection Loc  Result Date: 07/27/2018 CLINICAL DATA:  Confusion.  Unable to move arms in legs. EXAM: DIAGNOSTIC LUMBAR PUNCTURE UNDER FLUOROSCOPIC GUIDANCE FLUOROSCOPY TIME:  Fluoroscopy Time:  42 seconds Radiation Exposure Index (if provided by the fluoroscopic device): Is 16.2 mGy Number of Acquired Spot Images: 0 PROCEDURE: Informed consent was obtained from the patient prior to the procedure, including potential complications of headache, allergy, and pain. With the patient prone, the lower back was prepped with Betadine. 1% Lidocaine was used  for local anesthesia. Lumbar puncture was performed initially at the surgical level at L4-5 using a 20 gauge needle. There was slow return of blood tinged fluid. Opening pressure very low, approximately 12-13 cm of water. Very little fluid could be collected due to the very low pressures despite tilting the head up. Therefore, the needle was repositioned at a 2nd level at L3-4. Same blood tension fluid returned, with same low pressure. Very little fluid could be collected. A total of approximately 1 mL of blood-tinged cloudy fluid was collected. Fluid was sent for laboratory testing. The patient tolerated the procedure well and there were no apparent complications. IMPRESSION: Difficult lumbar puncture under fluoroscopic guidance due to very low pressures. Only a small amount of blood-tinged cloudy fluid could be collected despite tilting head significantly upward. Electronically Signed   By: Rolm Baptise M.D.   On: 07/27/2018 14:47        Scheduled Meds: . donepezil  10 mg Oral QHS  . escitalopram  20 mg Oral Daily  .  finasteride  5 mg Oral Daily  . irbesartan  150 mg Oral Daily  . liver oil-zinc oxide   Topical QID  . memantine  10 mg Oral BID  . pregabalin  75 mg Oral TID  . tamsulosin  0.8 mg Oral QHS   Continuous Infusions: . sodium chloride 50 mL/hr at 07/27/18 1800  . cefTRIAXone (ROCEPHIN)  IV 2 g (07/28/18 1226)     LOS: 6 days        Georgette Shell, MD [facility]   If 7PM-7AM, please contact night-coverage [email or web address] Password [email or web address] 07/28/2018, 2:01 PM

## 2018-07-28 NOTE — Progress Notes (Signed)
Family strongly considering a shift to full comfort.  They do not believe he would want to live without the use of his arms and legs.  Patient's brother from [place] is flying in to see him.  Family wants to ensure brother is in agreement with comfort (after seeing him) before discontinuing antibiotics and requesting a bed at [facility].  Full note to follow.  Florentina Jenny, PA-C Palliative Medicine Pager: [phone]

## 2018-07-28 NOTE — Progress Notes (Addendum)
[place] for Infectious Disease    Date of Admission:  07/22/2018    Principal Problem:   Sepsis [facility]) Active Problems:   Essential hypertension   Dementia ([place])   Acute encephalopathy   Acute lower UTI   Transaminitis   Neck pain   DNR (do not resuscitate)   Palliative care encounter    Subjective: Denies headache or worsening neck pain. Still unable to move extremities. Alert talking with his family  Underwent cervical MRI last night that confirmed suspicion of epidural abscess  Dr Saintclair Halsted evaluated imaging and felt patient not a candidate for surgical decompression since unlikely to regain function plus high risk for further injury  Medications:  . donepezil  10 mg Oral QHS  . escitalopram  20 mg Oral Daily  . finasteride  5 mg Oral Daily  . irbesartan  150 mg Oral Daily  . liver oil-zinc oxide   Topical QID  . memantine  10 mg Oral BID  . pregabalin  75 mg Oral TID  . tamsulosin  0.8 mg Oral QHS    Objective: Vital signs in last 24 hours: Temp:  [97.9 F (36.6 C)-98.8 F (37.1 C)] 97.9 F (36.6 C) (01/09 0453) Pulse Rate:  [77-78] 77 (01/09 0453) Resp:  [20-22] 22 (01/09 0453) BP: (155-157)/(69-79) 155/79 (01/09 0453) SpO2:  [94 %-96 %] 96 % (01/09 0453) Physical Exam  Constitutional: He is oriented to person, place. He appears well-developed and well-nourished. No distress.  HENT:  Mouth/Throat: Oropharynx is clear and moist. No oropharyngeal exudate.  Cardiovascular: Normal rate, regular rhythm and normal heart sounds. Exam reveals no gallop and no friction rub.  No murmur heard.  Pulmonary/Chest: Effort normal and breath sounds normal. No respiratory distress. He has no wheezes.  Abdominal: Soft. Bowel sounds are normal. He exhibits no distension. There is no tenderness.  Gu= foley in place Neurological: He is alert and oriented to person, place. Some twitching of trapezius muscle and left bicep. Unable to move arms, legs, no hand grip. +sensation ==  this part of the exam is unchnaged Skin: Skin is warm and dry. No rash noted. No erythema.  Psychiatric: He has a normal mood and affect. His behavior is normal.     Lab Results Recent Labs    07/26/18 0633 07/27/18 1009  WBC 14.4* 12.6*  HGB 11.0* 10.6*  HCT 36.1* 35.2*  NA 147* 145  K 3.6 3.6  CL 114* 113*  CO2 25 24  BUN 27* 40*  CREATININE 0.72 0.75   Liver Panel Recent Labs    07/26/18 0533 07/27/18 1009  PROT 5.6* 5.5*  ALBUMIN 1.7* 1.8*  AST 72* 66*  ALT 87* 61*  ALKPHOS 95 78  BILITOT 0.9 0.6  BILIDIR  --  0.2  IBILI  --  0.4    Microbiology: 1/6 blood cx ngtd 1/3 urine cx MSSA 1/3 blood cx MSSA Studies/Results: Dg Neck Soft Tissue  Result Date: 07/26/2018 CLINICAL DATA:  Severe left sided neck pain worsening over last two days; limited range of motion EXAM: NECK SOFT TISSUES - 1+ VIEW COMPARISON:  Neck CT 07/01/2018 FINDINGS: Multilevel disc osteophytic disease with facet hypertrophy. Patient's head is tilted rightward. No acute findings in the cervical spine identified. No prevertebral soft tissue thickening. No abnormality in the oropharynx or hypopharynx identified. IMPRESSION: No acute findings identified by plain film radiography. Electronically Signed   By: Suzy Bouchard M.D.   On: 07/26/2018 14:17   Mr Jodene Nam Neck W Wo  Contrast  Result Date: 07/26/2018 CLINICAL DATA:  Initial evaluation for acute unexplained altered mental status, slowly progressive weakness, neck stiffness. EXAM: MRI HEAD WITHOUT AND WITH CONTRAST MRA NECK WITHOUT AND WITH CONTRAST TECHNIQUE: Multiplanar, multiecho pulse sequences of the brain and surrounding structures were obtained without and with intravenous contrast. Angiographic images of the neck were obtained using MRA technique without and with intravenous contrast. Carotid stenosis measurements (when applicable) are obtained utilizing NASCET criteria, using the distal internal carotid diameter as the denominator. CONTRAST:  10  cc of Gadavist. COMPARISON:  None available. FINDINGS: MRI HEAD FINDINGS Diffuse prominence of the CSF containing spaces compatible generalized age-related cerebral atrophy. Mild chronic microvascular changes present within the periventricular white matter. No abnormal foci of restricted diffusion to suggest acute or subacute ischemia. Gray-white matter differentiation maintained. No encephalomalacia to suggest chronic infarction. No foci of susceptibility artifact to suggest acute or chronic intracranial hemorrhage. No mass lesion, midline shift or mass effect. No hydrocephalus. No extra-axial fluid collection. Pituitary gland within normal limits. Midline structures intact and normal. No abnormal enhancement. No findings to suggest meningitis or intracranial infection. Hand healed and hand Major intracranial vascular flow voids are well maintained. Craniocervical junction within normal limits. Degenerative spondylolysis at C3-4 with resultant at least moderate spinal stenosis (series 5, image 13). Finding incompletely evaluated on this exam. Bone marrow signal intensity within normal limits. No scalp soft tissue abnormality. Patient status post left-sided ocular lens replacement. Globes and orbital soft tissues demonstrate no acute finding. Mild mucosal thickening within the ethmoidal air cells and maxillary sinuses. Paranasal sinuses are otherwise clear. Trace right mastoid effusion noted, of doubtful significance. MRA NECK FINDINGS Source time-of-flight imaging demonstrates patent antegrade flow within both carotid and vertebral arteries bilaterally. No evidence for arterial dissection on axial T1 fat-sat imaging. Visualized aortic arch of normal caliber with normal branch pattern. No hemodynamically significant stenosis about the origin of the great vessels. Visualized subclavian arteries widely patent. Right common carotid artery patent from its origin to the bifurcation without stenosis. Atheromatous  irregularity at the proximal right ICA with relatively mild 20-25% stenosis by NASCET criteria. Right ICA widely patent distally to the skull base. Left common carotid artery patent from its origin to the bifurcation without stenosis. Short-segment mild approximate 25% stenosis at the origin of the left ICA. Left ICA otherwise widely patent to the skull base. Both of the vertebral arteries arise from the subclavian arteries. Short-segment approximate 60% stenosis at the origin of the right vertebral artery. Relatively mild no more than 20% narrowing at the origin of left vertebral artery. Vertebral arteries otherwise widely patent within the neck without stenosis or occlusion. IMPRESSION: MRI HEAD IMPRESSION: 1. No acute intracranial abnormality. No evidence for intracranial infection. 2. Mild age-related cerebral atrophy with chronic small vessel ischemic disease. MRA NECK IMPRESSION: 1. Negative MRA with no evidence for acute abnormality about the major arterial vasculature of the neck. 2. Atheromatous stenoses about the proximal ICAs bilaterally with associated narrowing of up to approximately 20-25% by NASCET criteria. 3. Short-segment approximate 60% atheromatous stenosis at the origin of the right vertebral artery. Relatively mild no more than 25% narrowing at the origin of the left vertebral artery. Vertebral arteries otherwise widely patent within the neck. Electronically Signed   By: Jeannine Boga M.D.   On: 07/26/2018 23:06   Mr Jeri Cos JY Contrast  Result Date: 07/26/2018 CLINICAL DATA:  Initial evaluation for acute unexplained altered mental status, slowly progressive weakness, neck stiffness. EXAM: MRI HEAD  WITHOUT AND WITH CONTRAST MRA NECK WITHOUT AND WITH CONTRAST TECHNIQUE: Multiplanar, multiecho pulse sequences of the brain and surrounding structures were obtained without and with intravenous contrast. Angiographic images of the neck were obtained using MRA technique without and with  intravenous contrast. Carotid stenosis measurements (when applicable) are obtained utilizing NASCET criteria, using the distal internal carotid diameter as the denominator. CONTRAST:  10 cc of Gadavist. COMPARISON:  None available. FINDINGS: MRI HEAD FINDINGS Diffuse prominence of the CSF containing spaces compatible generalized age-related cerebral atrophy. Mild chronic microvascular changes present within the periventricular white matter. No abnormal foci of restricted diffusion to suggest acute or subacute ischemia. Gray-white matter differentiation maintained. No encephalomalacia to suggest chronic infarction. No foci of susceptibility artifact to suggest acute or chronic intracranial hemorrhage. No mass lesion, midline shift or mass effect. No hydrocephalus. No extra-axial fluid collection. Pituitary gland within normal limits. Midline structures intact and normal. No abnormal enhancement. No findings to suggest meningitis or intracranial infection. Hand healed and hand Major intracranial vascular flow voids are well maintained. Craniocervical junction within normal limits. Degenerative spondylolysis at C3-4 with resultant at least moderate spinal stenosis (series 5, image 13). Finding incompletely evaluated on this exam. Bone marrow signal intensity within normal limits. No scalp soft tissue abnormality. Patient status post left-sided ocular lens replacement. Globes and orbital soft tissues demonstrate no acute finding. Mild mucosal thickening within the ethmoidal air cells and maxillary sinuses. Paranasal sinuses are otherwise clear. Trace right mastoid effusion noted, of doubtful significance. MRA NECK FINDINGS Source time-of-flight imaging demonstrates patent antegrade flow within both carotid and vertebral arteries bilaterally. No evidence for arterial dissection on axial T1 fat-sat imaging. Visualized aortic arch of normal caliber with normal branch pattern. No hemodynamically significant stenosis about  the origin of the great vessels. Visualized subclavian arteries widely patent. Right common carotid artery patent from its origin to the bifurcation without stenosis. Atheromatous irregularity at the proximal right ICA with relatively mild 20-25% stenosis by NASCET criteria. Right ICA widely patent distally to the skull base. Left common carotid artery patent from its origin to the bifurcation without stenosis. Short-segment mild approximate 25% stenosis at the origin of the left ICA. Left ICA otherwise widely patent to the skull base. Both of the vertebral arteries arise from the subclavian arteries. Short-segment approximate 60% stenosis at the origin of the right vertebral artery. Relatively mild no more than 20% narrowing at the origin of left vertebral artery. Vertebral arteries otherwise widely patent within the neck without stenosis or occlusion. IMPRESSION: MRI HEAD IMPRESSION: 1. No acute intracranial abnormality. No evidence for intracranial infection. 2. Mild age-related cerebral atrophy with chronic small vessel ischemic disease. MRA NECK IMPRESSION: 1. Negative MRA with no evidence for acute abnormality about the major arterial vasculature of the neck. 2. Atheromatous stenoses about the proximal ICAs bilaterally with associated narrowing of up to approximately 20-25% by NASCET criteria. 3. Short-segment approximate 60% atheromatous stenosis at the origin of the right vertebral artery. Relatively mild no more than 25% narrowing at the origin of the left vertebral artery. Vertebral arteries otherwise widely patent within the neck. Electronically Signed   By: Jeannine Boga M.D.   On: 07/26/2018 23:06   Mr Cervical Spine W Wo Contrast  Result Date: 07/27/2018 CLINICAL DATA:  Quadriplegia for greater than 48 hours. No sensory response below the neck. Sepsis with methicillin sensitive Staph aureus. Multiple falls prior to quadriplegia. Dementia. EXAM: MRI CERVICAL AND THORACIC SPINE WITHOUT AND WITH  CONTRAST TECHNIQUE: Multiplanar and multiecho  pulse sequences of the cervical spine, to include the craniocervical junction and cervicothoracic junction, and thoracic spine, were obtained without and with intravenous contrast. CONTRAST:  Gadavist 10 mL. COMPARISON:  CT cervical spine 07/01/2018. MRI thoracic spine 07/01/2018. MRI lumbar spine 07/01/2018. MRA extracranial and intracranial 07/26/2018. FINDINGS: The patient was unable to remain motionless for the exam. Small or subtle lesions could be overlooked. Yo MRI CERVICAL SPINE FINDINGS Alignment: Anatomic Vertebrae: Congenital C2-C3 fusion/assimilation. Discal hyperintensity at C3-4 with subtle enhancement involving the C3 and C4 vertebral bodies suggesting osteomyelitis/discitis. Cord: The cord is contacted, and slightly compressed, by a ventral epidural abscess epicenter C2-C3, measuring 12 x 5 x 22 mm. The cord is equivocally hyperintense opposite C2 and C3; see sagittal STIR series 4, image 7. There may be a second small epidural abscess in the ventral epidural space opposite C6, with mild to moderate cord flattening related to spondylotic ridging at C5-C6 but no clear abnormality of cord signal. Posterior Fossa, vertebral arteries, paraspinal tissues: No tonsillar herniation. Prevertebral enhancement extends from the foramen magnum to C6 representing retropharyngeal phlegmon. Features of retropharyngeal abscess extend from C3 through C5. Disc levels: C2-3: Rudimentary. Stenosis with cord flattening secondary to epidural abscess. C3-4: Suspected discitis and regional osteomyelitis. Osseous ridging results in canal stenosis and cord flattening. Prevertebral retropharyngeal abscess. C4-5: Spondylotic ridging contributes to mild stenosis and foraminal narrowing. C5-6: Discal hyperintensity in the setting of severe loss of disc height could represent additional disk infection. No visible adjacent osteomyelitis. Osseous ridging contributes to stenosis, below  which there is possible additional small epidural abscess. C6-7:  Disc space narrowing, annular bulge. C7-T1:  Unremarkable. THORACIC SPINE FINDINGS Segmentation:  Standard Alignment:  Physiologic Vertebrae: No fracture. Subtle endplate hyperintensity superiorly at T12 associated with slight abnormal T11-T12 discal hyperintensity, concerning for early T12 osteomyelitis. These changes were not present previously. Cord: No abnormal cord signal. Paraspinal and other soft tissues: Dependent atelectasis versus consolidation RIGHT lower lobe. Disc levels: Disc spaces are unremarkable or display only mild spondylosis except for T11-12, mildly hyperintense, concerning for early discitis. IMPRESSION: MRI CERVICAL SPINE: Suspected C3-4 diskitis, C3 and C4 osteomyelitis, 12 x 5 x 22 mm C2-3 ventral epidural abscess, as well as extensive retropharyngeal phlegmon and abscess. Possible increased cord signal at C2 and C3, but no features to suggest posttraumatic cord contusion. Mild cord flattening at this level related to the adjacent epidural abscess. Multilevel spondylosis with areas of stenosis due to osseous ridging. Small ventral epidural collection at C5-6 not clearly compressive. MRI THORACIC SPINE: No thoracic epidural abscess. T11-T12 discal hyperintensity and abnormal edema in the T12 vertebral body, could represent early discitis and osteomyelitis. Dependent atelectasis versus consolidation RIGHT lower lobe. Chest radiograph recommended. A call has been placed to the ordering provider. Electronically Signed   By: Staci Righter M.D.   On: 07/27/2018 21:05   Mr Thoracic Spine W Wo Contrast  Result Date: 07/27/2018 CLINICAL DATA:  Quadriplegia for greater than 48 hours. No sensory response below the neck. Sepsis with methicillin sensitive Staph aureus. Multiple falls prior to quadriplegia. Dementia. EXAM: MRI CERVICAL AND THORACIC SPINE WITHOUT AND WITH CONTRAST TECHNIQUE: Multiplanar and multiecho pulse sequences of  the cervical spine, to include the craniocervical junction and cervicothoracic junction, and thoracic spine, were obtained without and with intravenous contrast. CONTRAST:  Gadavist 10 mL. COMPARISON:  CT cervical spine 07/01/2018. MRI thoracic spine 07/01/2018. MRI lumbar spine 07/01/2018. MRA extracranial and intracranial 07/26/2018. FINDINGS: The patient was unable to remain motionless for the exam.  Small or subtle lesions could be overlooked. Yo MRI CERVICAL SPINE FINDINGS Alignment: Anatomic Vertebrae: Congenital C2-C3 fusion/assimilation. Discal hyperintensity at C3-4 with subtle enhancement involving the C3 and C4 vertebral bodies suggesting osteomyelitis/discitis. Cord: The cord is contacted, and slightly compressed, by a ventral epidural abscess epicenter C2-C3, measuring 12 x 5 x 22 mm. The cord is equivocally hyperintense opposite C2 and C3; see sagittal STIR series 4, image 7. There may be a second small epidural abscess in the ventral epidural space opposite C6, with mild to moderate cord flattening related to spondylotic ridging at C5-C6 but no clear abnormality of cord signal. Posterior Fossa, vertebral arteries, paraspinal tissues: No tonsillar herniation. Prevertebral enhancement extends from the foramen magnum to C6 representing retropharyngeal phlegmon. Features of retropharyngeal abscess extend from C3 through C5. Disc levels: C2-3: Rudimentary. Stenosis with cord flattening secondary to epidural abscess. C3-4: Suspected discitis and regional osteomyelitis. Osseous ridging results in canal stenosis and cord flattening. Prevertebral retropharyngeal abscess. C4-5: Spondylotic ridging contributes to mild stenosis and foraminal narrowing. C5-6: Discal hyperintensity in the setting of severe loss of disc height could represent additional disk infection. No visible adjacent osteomyelitis. Osseous ridging contributes to stenosis, below which there is possible additional small epidural abscess. C6-7:   Disc space narrowing, annular bulge. C7-T1:  Unremarkable. THORACIC SPINE FINDINGS Segmentation:  Standard Alignment:  Physiologic Vertebrae: No fracture. Subtle endplate hyperintensity superiorly at T12 associated with slight abnormal T11-T12 discal hyperintensity, concerning for early T12 osteomyelitis. These changes were not present previously. Cord: No abnormal cord signal. Paraspinal and other soft tissues: Dependent atelectasis versus consolidation RIGHT lower lobe. Disc levels: Disc spaces are unremarkable or display only mild spondylosis except for T11-12, mildly hyperintense, concerning for early discitis. IMPRESSION: MRI CERVICAL SPINE: Suspected C3-4 diskitis, C3 and C4 osteomyelitis, 12 x 5 x 22 mm C2-3 ventral epidural abscess, as well as extensive retropharyngeal phlegmon and abscess. Possible increased cord signal at C2 and C3, but no features to suggest posttraumatic cord contusion. Mild cord flattening at this level related to the adjacent epidural abscess. Multilevel spondylosis with areas of stenosis due to osseous ridging. Small ventral epidural collection at C5-6 not clearly compressive. MRI THORACIC SPINE: No thoracic epidural abscess. T11-T12 discal hyperintensity and abnormal edema in the T12 vertebral body, could represent early discitis and osteomyelitis. Dependent atelectasis versus consolidation RIGHT lower lobe. Chest radiograph recommended. A call has been placed to the ordering provider. Electronically Signed   By: Staci Righter M.D.   On: 07/27/2018 21:05   Dg Fluoro Guided Needle Plc Aspiration/injection Loc  Result Date: 07/27/2018 CLINICAL DATA:  Confusion.  Unable to move arms in legs. EXAM: DIAGNOSTIC LUMBAR PUNCTURE UNDER FLUOROSCOPIC GUIDANCE FLUOROSCOPY TIME:  Fluoroscopy Time:  42 seconds Radiation Exposure Index (if provided by the fluoroscopic device): Is 16.2 mGy Number of Acquired Spot Images: 0 PROCEDURE: Informed consent was obtained from the patient prior to the  procedure, including potential complications of headache, allergy, and pain. With the patient prone, the lower back was prepped with Betadine. 1% Lidocaine was used for local anesthesia. Lumbar puncture was performed initially at the surgical level at L4-5 using a 20 gauge needle. There was slow return of blood tinged fluid. Opening pressure very low, approximately 12-13 cm of water. Very little fluid could be collected due to the very low pressures despite tilting the head up. Therefore, the needle was repositioned at a 2nd level at L3-4. Same blood tension fluid returned, with same low pressure. Very little fluid could be collected.  A total of approximately 1 mL of blood-tinged cloudy fluid was collected. Fluid was sent for laboratory testing. The patient tolerated the procedure well and there were no apparent complications. IMPRESSION: Difficult lumbar puncture under fluoroscopic guidance due to very low pressures. Only a small amount of blood-tinged cloudy fluid could be collected despite tilting head significantly upward. Electronically Signed   By: Rolm Baptise M.D.   On: 07/27/2018 14:47     Assessment/Plan: Disseminated MSSA infection, with cervical ventral epidural abscess/cervical C3-4 osteomyelitis/discitis +/- T11 early discitis with quadriplegia = continue on ceftriaxone 2gm IV Q12 hr for now  Family is deciding whether to transition to comfort care since their father's wishes would not want to include feeding tube  Discussed with them that though he is more alert today, his neurologic deficits, motor function/quadriplegia, are unlikely to improve significantly. Even with treatment of iv therapy 8 wks, he would still need lifelong abtx and difficult to say if he would have any meaningful recovery  They have upcoming meeting with palliative care  Have discussed plan with dr Zigmund Daniel, dr Brigitte Pulse (PCP) and palliative care team and family.  Would postpone placement of picc line for now or any  further invasive procedures until palliative care discussion  Spent 45 min with family,patient regarding prognosis and coordination of care  Will sign off  [facility] for Infectious Diseases Cell: [phone] Pager: [phone]  07/28/2018, 12:16 PM

## 2018-07-28 NOTE — Progress Notes (Signed)
Daily Progress Note   Patient Name: Ricky Black       Date: 07/28/2018 DOB: 03/24/1941  Age: 78 y.o. MRN#: [id number] Attending Physician: Ricky Shell, MD Primary Care Physician: Ricky Redwood, MD Admit Date: 07/22/2018  Reason for Consultation/Follow-up: Establishing goals of care  Subjective: Met with Ricky Black, and Ricky Black for an extended period.  They have had conversations with Drs. Ricky Black, and Ricky Black and understand their father's prognosis.  They asked to review images again.  We discussed SNF with antibiotics vs comfort care and [facility]osp[facility].  After some debate and emotion was shared the family determined that Ricky Black would not want to live without the function of his limbs and he would not want to live in a facility for the rest of his life.    Ricky Black expressed a concern that Ricky Black's only living brother needed to have an opportunity to weigh in on the decision.    Assessment: Patient critically ill with spinal abscesses, osteomyelitis, and diskitis.  His condition can not be improved surgically.  Even if the infection cleared (which would be difficult) he would be unlikely to regain use of his limbs again.   Patient Profile/HPI:  78 y.o.malewith past medical history of arthritis, bradycardia, neuropathy, and moderate dementiawho was admitted on 1/3/2020with sepsis with encephalopathy and MSSA bacteremia. His extremities are unexpectedly flaccid and he has severe neck pain.  He was noted to be aspirating. Palliative medicine has been asked to assist with goals of care.   Length of Stay: 6  Current Medications: Scheduled Meds:  . donepezil  10 mg Oral QHS  . escitalopram  20 mg Oral Daily  . finasteride  5 mg Oral Daily  . irbesartan  150 mg Oral Daily  . liver  oil-zinc oxide   Topical QID  . memantine  10 mg Oral BID  . pregabalin  75 mg Oral TID  . tamsulosin  0.8 mg Oral QHS    Continuous Infusions: . sodium chloride 50 mL/hr at 07/28/18 1406  . cefTRIAXone (ROCEPHIN)  IV 2 g (07/28/18 1226)    PRN Meds: acetaminophen, HYDROcodone-acetaminophen, ketorolac, ondansetron **OR** ondansetron (ZOFRAN) IV, senna-docusate  Physical Exam        Well developed pleasantly demented man, sleeping soundly. CV rrr resp no distress Abdomen soft, nt, nd  Vital  Signs: BP 126/74 (BP Location: Left Arm)   Pulse 74   Temp 97.8 F (36.6 C)   Resp 16   Ht '6\' 2"'$  (1.88 m)   Wt 107.5 kg   SpO2 95%   BMI 30.43 kg/m  SpO2: SpO2: 95 % O2 Device: O2 Device: Room Air O2 Flow Rate: O2 Flow Rate (L/min): 2 L/min  Intake/output summary:   Intake/Output Summary (Last 24 hours) at 07/28/2018 1553 Last data filed at 07/28/2018 0600 Gross per 24 hour  Intake 728.83 ml  Output -  Net 728.83 ml   LBM: Last BM Date: 07/27/18 Baseline Weight: Weight: 107.5 kg Most recent weight: Weight: 107.5 kg       Palliative Assessment/Data: 20%    Flowsheet Rows     Most Recent Value  Intake Tab  Referral Department  Hospitalist  Unit at Time of Referral  Med/Surg Unit  Palliative Care Primary Diagnosis  Sepsis/Infectious Disease  Date Notified  07/25/18  Palliative Care Type  New Palliative care  Reason for referral  Clarify Goals of Care  Date of Admission  07/22/18  Date first seen by Palliative Care  07/26/18  # of days Palliative referral response time  1 Day(s)  # of days IP prior to Palliative referral  3  Clinical Assessment  Psychosocial & Spiritual Assessment  Palliative Care Outcomes      Patient Active Problem List   Diagnosis Date Noted  . Urinary tract infection without hematuria   . Staphylococcus aureus bacteremia   . Quadriplegia ([place])   . Abscess in epidural space of cervical spine   . Neck pain   . DNR (do not resuscitate)   .  Palliative care encounter   . Sepsis ([place]) 07/22/2018  . Acute lower UTI 07/22/2018  . Transaminitis 07/22/2018  . Acute encephalopathy 07/02/2018  . Acute cystitis with hematuria 07/02/2018  . Back pain 07/01/2018  . Delirium 07/01/2018  . Hematuria, microscopic 07/01/2018  . Bradycardia   . Dizziness   . Orthostatic hypotension   . Spinal arthritis   . Neuropathy   . Hypogonadism male   . Dementia ([place])   . Abnormal EKG   . Essential hypertension 05/10/2007  . ARTHRITIS 05/10/2007    Palliative Care Plan    Recommendations/Plan:  PMT will continue to follow to confirm Ricky Black on 1/10.  Will restart hydrocodone for pain -  in liquid form as the patient is having difficulty swallowing.   Goals of Care and Additional Recommendations:  Limitations on Scope of Treatment: Full Scope Treatment  Code Status:  DNR  Prognosis:   Unable to determine   Discharge Planning:  To Be Determined  Care plan was discussed with family, Dr. Rodena Black  Thank you for allowing the Palliative Medicine Team to assist in the care of this patient.  Total time spent:  60 min.     Greater than 50%  of this time was spent counseling and coordinating care related to the above assessment and plan.  Ricky Jenny, PA-C Palliative Medicine  Please contact Palliative MedicineTeam phone at [phone] for questions and concerns between 7 am - 7 pm.   Please see AMION for individual provider pager numbers.

## 2018-07-28 NOTE — Progress Notes (Signed)
SLP Cancellation Note  Patient Details Name: Ricky Black MRN: [id number] DOB: November 14, 1940   Cancelled treatment:       Reason Eval/Treat Not Completed: Other (comment)(SLP will await decisions re: MBS based on neurosurgery recommendation for comfort until plans are determined, spoke to RN)   Macario Golds 07/28/2018, 8:07 AM  Luanna Salk, [place] [facility] SLP [place] Pager [phone] Office [phone]

## 2018-07-28 NOTE — Progress Notes (Addendum)
STROKE TEAM PROGRESS NOTE   SUBJECTIVE (INTERVAL HISTORY) His daughters are at the bedside.  He is more awake alert today, still not orientated. Still has quadriplegia. Overnight MRI showed cervical epidural abscess with cord compression. Dr. Saintclair Halsted from [facility] on board and not surgery candidate. Pt family is going to meet with palliative care to consider comfort care.    OBJECTIVE Temp:  [97.9 F (36.6 C)-98.8 F (37.1 C)] 97.9 F (36.6 C) (01/09 0453) Pulse Rate:  [77-78] 77 (01/09 0453) Resp:  [20-22] 22 (01/09 0453) BP: (155-157)/(69-79) 155/79 (01/09 0453) SpO2:  [94 %-96 %] 96 % (01/09 0453)  No results for input(s): GLUCAP in the last 168 hours. Recent Labs  Lab 07/22/18 1005 07/23/18 0658 07/26/18 0533 07/26/18 0633 07/27/18 1009  NA 139 144 148* 147* 145  K 4.0 3.6 3.7 3.6 3.6  CL 100 110 115* 114* 113*  CO2 24 23 25 25 24   GLUCOSE 190* 194* 153* 163* 221*  BUN 46* 43* 26* 27* 40*  CREATININE 0.94 0.81 0.69 0.72 0.75  CALCIUM 9.0 8.3* 8.4* 8.4* 8.1*   Recent Labs  Lab 07/22/18 1005 07/23/18 0658 07/26/18 0533 07/27/18 1009  AST 63* 71* 72* 66*  ALT 63* 64* 87* 61*  ALKPHOS 107 83 95 78  BILITOT 1.4* 0.5 0.9 0.6  PROT 7.3 6.3* 5.6* 5.5*  ALBUMIN 2.7* 2.1* 1.7* 1.8*   Recent Labs  Lab 07/22/18 1005 07/23/18 0658 07/26/18 0533 07/26/18 0633 07/27/18 1009  WBC 13.3* 11.8* 13.9* 14.4* 12.6*  NEUTROABS 12.3*  --   --   --   --   HGB 13.0 11.4* 11.4* 11.0* 10.6*  HCT 40.3 36.3* 37.4* 36.1* 35.2*  MCV 103.6* 104.6* 107.8* 108.4* 110.0*  PLT 201 180 233 220 219   Recent Labs  Lab 07/26/18 0533  CKTOTAL 48*   No results for input(s): LABPROT, INR in the last 72 hours. No results for input(s): COLORURINE, LABSPEC, [place], GLUCOSEU, HGBUR, BILIRUBINUR, KETONESUR, PROTEINUR, UROBILINOGEN, NITRITE, LEUKOCYTESUR in the last 72 hours.  Invalid input(s): APPERANCEUR  No results found for: CHOL, TRIG, HDL, CHOLHDL, VLDL, LDLCALC No results found for:  HGBA1C No results found for: LABOPIA, COCAINSCRNUR, LABBENZ, AMPHETMU, THCU, LABBARB  No results for input(s): ETH in the last 168 hours.  I have personally reviewed the radiological images below and agree with the radiology interpretations.  Dg Neck Soft Tissue  Result Date: 07/26/2018 CLINICAL DATA:  Severe left sided neck pain worsening over last two days; limited range of motion EXAM: NECK SOFT TISSUES - 1+ VIEW COMPARISON:  Neck CT 07/01/2018 FINDINGS: Multilevel disc osteophytic disease with facet hypertrophy. Patient's head is tilted rightward. No acute findings in the cervical spine identified. No prevertebral soft tissue thickening. No abnormality in the oropharynx or hypopharynx identified. IMPRESSION: No acute findings identified by plain film radiography. Electronically Signed   By: Suzy Bouchard M.D.   On: 07/26/2018 14:17   Dg Chest 1 View  Result Date: 07/24/2018 CLINICAL DATA:  Shortness of breath and weakness. EXAM: CHEST  1 VIEW COMPARISON:  07/22/2018 FINDINGS: Lungs are hypoinflated with mild bibasilar opacification which may be due to atelectasis or infection. Cardiomediastinal silhouette and remainder the exam is unchanged. IMPRESSION: Bibasilar opacification which may be due to atelectasis or infection. Electronically Signed   By: Marin Olp M.D.   On: 07/24/2018 12:34   Dg Chest 1 View  Result Date: 07/01/2018 CLINICAL DATA:  Fall. EXAM: CHEST  1 VIEW COMPARISON:  Chest radiograph August 07, 2005  FINDINGS: The cardiac silhouette is upper limits of normal in size. Mediastinal silhouette is not suspicious. No pleural effusion or focal consolidation. No pneumothorax. Soft tissue planes and included osseous structures are nonacute. IMPRESSION: Borderline cardiomegaly, no acute pulmonary process. Electronically Signed   By: Elon Alas M.D.   On: 07/01/2018 23:05   Ct Head Wo Contrast  Result Date: 07/22/2018 CLINICAL DATA:  Altered level of consciousness, sepsis  EXAM: CT HEAD WITHOUT CONTRAST TECHNIQUE: Contiguous axial images were obtained from the base of the skull through the vertex without intravenous contrast. COMPARISON:  07/01/2018 FINDINGS: Brain: Similar atrophy pattern and chronic white matter microvascular ischemic changes throughout both cerebral hemispheres. No acute intracranial hemorrhage, mass lesion, new infarction, midline shift, herniation, hydrocephalus, or extra-axial fluid collection. No focal mass effect or edema. Cisterns are patent. Minor cerebellar atrophy as well. Vascular: No hyperdense vessel or unexpected calcification. Skull: Normal. Negative for fracture or focal lesion. Sinuses/Orbits: No acute finding. Other: None. IMPRESSION: Stable atrophy and chronic white matter microvascular ischemic changes. No acute intracranial abnormality by noncontrast CT. Electronically Signed   By: Jerilynn Mages.  Shick M.D.   On: 07/22/2018 13:47   Ct Head Wo Contrast  Result Date: 07/01/2018 CLINICAL DATA:  Altered mental status S. Fall. Urinary tract infection. Confusion. EXAM: CT HEAD WITHOUT CONTRAST CT CERVICAL SPINE WITHOUT CONTRAST TECHNIQUE: Multidetector CT imaging of the head and cervical spine was performed following the standard protocol without intravenous contrast. Multiplanar CT image reconstructions of the cervical spine were also generated. COMPARISON:  06/30/2018 FINDINGS: Despite efforts by the technologist and patient, motion artifact is present on today's exam and could not be eliminated. This reduces exam sensitivity and specificity. CT HEAD FINDINGS Brain: The brainstem, cerebellum, cerebral peduncles, thalami, basal ganglia, basilar cisterns, and ventricular system appear within normal limits. No intracranial hemorrhage, mass lesion, or acute CVA. Overall no significant intracranial abnormality. Vascular: Unremarkable Skull: Unremarkable Sinuses/Orbits: Chronic right maxillary and ethmoid sinusitis. Other: No supplemental non-categorized  findings. CT CERVICAL SPINE FINDINGS Alignment: No vertebral subluxation is observed. Skull base and vertebrae: Reduced sensitivity due to motion, no discrete fracture is identified. Fused C2 and C3 vertebra. Soft tissues and spinal canal: Bilateral common carotid atherosclerotic calcification. Disc levels: Foraminal impingement at all cervical levels due to uncinate spurring and facet arthropathy. Upper chest: Unremarkable Other: Served skip other IMPRESSION: 1. No acute intracranial findings or acute cervical spine findings. 2. Chronic right maxillary and ethmoid sinusitis. 3. Reduced sensitivity due to motion artifact. 4. Foraminal impingement at all cervical levels due to uncinate spurring and facet arthropathy. 5. Congenital fusion at C2-3. Electronically Signed   By: Van Clines M.D.   On: 07/01/2018 23:27   Ct Head Wo Contrast  Result Date: 06/30/2018 CLINICAL DATA:  Altered mental status.  Head trauma. EXAM: CT HEAD WITHOUT CONTRAST CT CERVICAL SPINE WITHOUT CONTRAST TECHNIQUE: Multidetector CT imaging of the head and cervical spine was performed following the standard protocol without intravenous contrast. Multiplanar CT image reconstructions of the cervical spine were also generated. COMPARISON:  Report from 09/07/2002 FINDINGS: Despite efforts by the technologist and patient, motion artifact is present on today's exam and could not be eliminated. This reduces exam sensitivity and specificity. CT HEAD FINDINGS Brain: The brainstem, cerebellum, cerebral peduncles, thalami, basal ganglia, basilar cisterns, and ventricular system appear within normal limits. No intracranial hemorrhage, mass lesion, or acute CVA. Vascular: Unremarkable Skull: Unremarkable Sinuses/Orbits: Mild chronic right maxillary sinusitis. Other: No supplemental non-categorized findings. CT CERVICAL SPINE FINDINGS Alignment: No vertebral subluxation  is observed. Skull base and vertebrae: There is congenital fusion at C2-3  between the vertebral bodies and posterior elements. Prominent loss of articular space anteriorly at C1-2. Schmorl's nodes at C4-5. No appreciable fracture. Soft tissues and spinal canal: No prevertebral soft tissue swelling. Bilateral common carotid atherosclerotic calcification. Disc levels: Facet and uncinate spurring cause foraminal impingement on the right at C3-4, C4-5, C5-6, and C6-7; and on the left at C4-5, C5-6, C6-7, and C7-T1. There is central narrowing of the thecal sac due to spurring at C3-4 and C4-5. Upper chest: Unremarkable Other: No supplemental non-categorized findings. IMPRESSION: 1. No acute intracranial findings or acute cervical spine findings. 2. Impingement at all levels between C3 and T1 due to facet and uncinate spurring. 3. Congenital fusion at C2-3. 4. Atherosclerosis. 5. Mild chronic right maxillary sinusitis. Electronically Signed   By: Van Clines M.D.   On: 06/30/2018 20:50   Ct Cervical Spine Wo Contrast  Result Date: 07/01/2018 CLINICAL DATA:  Altered mental status S. Fall. Urinary tract infection. Confusion. EXAM: CT HEAD WITHOUT CONTRAST CT CERVICAL SPINE WITHOUT CONTRAST TECHNIQUE: Multidetector CT imaging of the head and cervical spine was performed following the standard protocol without intravenous contrast. Multiplanar CT image reconstructions of the cervical spine were also generated. COMPARISON:  06/30/2018 FINDINGS: Despite efforts by the technologist and patient, motion artifact is present on today's exam and could not be eliminated. This reduces exam sensitivity and specificity. CT HEAD FINDINGS Brain: The brainstem, cerebellum, cerebral peduncles, thalami, basal ganglia, basilar cisterns, and ventricular system appear within normal limits. No intracranial hemorrhage, mass lesion, or acute CVA. Overall no significant intracranial abnormality. Vascular: Unremarkable Skull: Unremarkable Sinuses/Orbits: Chronic right maxillary and ethmoid sinusitis. Other: No  supplemental non-categorized findings. CT CERVICAL SPINE FINDINGS Alignment: No vertebral subluxation is observed. Skull base and vertebrae: Reduced sensitivity due to motion, no discrete fracture is identified. Fused C2 and C3 vertebra. Soft tissues and spinal canal: Bilateral common carotid atherosclerotic calcification. Disc levels: Foraminal impingement at all cervical levels due to uncinate spurring and facet arthropathy. Upper chest: Unremarkable Other: Served skip other IMPRESSION: 1. No acute intracranial findings or acute cervical spine findings. 2. Chronic right maxillary and ethmoid sinusitis. 3. Reduced sensitivity due to motion artifact. 4. Foraminal impingement at all cervical levels due to uncinate spurring and facet arthropathy. 5. Congenital fusion at C2-3. Electronically Signed   By: Van Clines M.D.   On: 07/01/2018 23:27   Ct Cervical Spine Wo Contrast  Result Date: 06/30/2018 CLINICAL DATA:  Altered mental status.  Head trauma. EXAM: CT HEAD WITHOUT CONTRAST CT CERVICAL SPINE WITHOUT CONTRAST TECHNIQUE: Multidetector CT imaging of the head and cervical spine was performed following the standard protocol without intravenous contrast. Multiplanar CT image reconstructions of the cervical spine were also generated. COMPARISON:  Report from 09/07/2002 FINDINGS: Despite efforts by the technologist and patient, motion artifact is present on today's exam and could not be eliminated. This reduces exam sensitivity and specificity. CT HEAD FINDINGS Brain: The brainstem, cerebellum, cerebral peduncles, thalami, basal ganglia, basilar cisterns, and ventricular system appear within normal limits. No intracranial hemorrhage, mass lesion, or acute CVA. Vascular: Unremarkable Skull: Unremarkable Sinuses/Orbits: Mild chronic right maxillary sinusitis. Other: No supplemental non-categorized findings. CT CERVICAL SPINE FINDINGS Alignment: No vertebral subluxation is observed. Skull base and vertebrae:  There is congenital fusion at C2-3 between the vertebral bodies and posterior elements. Prominent loss of articular space anteriorly at C1-2. Schmorl's nodes at C4-5. No appreciable fracture. Soft tissues and spinal canal: No  prevertebral soft tissue swelling. Bilateral common carotid atherosclerotic calcification. Disc levels: Facet and uncinate spurring cause foraminal impingement on the right at C3-4, C4-5, C5-6, and C6-7; and on the left at C4-5, C5-6, C6-7, and C7-T1. There is central narrowing of the thecal sac due to spurring at C3-4 and C4-5. Upper chest: Unremarkable Other: No supplemental non-categorized findings. IMPRESSION: 1. No acute intracranial findings or acute cervical spine findings. 2. Impingement at all levels between C3 and T1 due to facet and uncinate spurring. 3. Congenital fusion at C2-3. 4. Atherosclerosis. 5. Mild chronic right maxillary sinusitis. Electronically Signed   By: Van Clines M.D.   On: 06/30/2018 20:50   Mr Jodene Nam Neck W Wo Contrast  Result Date: 07/26/2018 CLINICAL DATA:  Initial evaluation for acute unexplained altered mental status, slowly progressive weakness, neck stiffness. EXAM: MRI HEAD WITHOUT AND WITH CONTRAST MRA NECK WITHOUT AND WITH CONTRAST TECHNIQUE: Multiplanar, multiecho pulse sequences of the brain and surrounding structures were obtained without and with intravenous contrast. Angiographic images of the neck were obtained using MRA technique without and with intravenous contrast. Carotid stenosis measurements (when applicable) are obtained utilizing NASCET criteria, using the distal internal carotid diameter as the denominator. CONTRAST:  10 cc of Gadavist. COMPARISON:  None available. FINDINGS: MRI HEAD FINDINGS Diffuse prominence of the CSF containing spaces compatible generalized age-related cerebral atrophy. Mild chronic microvascular changes present within the periventricular white matter. No abnormal foci of restricted diffusion to suggest acute  or subacute ischemia. Gray-white matter differentiation maintained. No encephalomalacia to suggest chronic infarction. No foci of susceptibility artifact to suggest acute or chronic intracranial hemorrhage. No mass lesion, midline shift or mass effect. No hydrocephalus. No extra-axial fluid collection. Pituitary gland within normal limits. Midline structures intact and normal. No abnormal enhancement. No findings to suggest meningitis or intracranial infection. Hand healed and hand Major intracranial vascular flow voids are well maintained. Craniocervical junction within normal limits. Degenerative spondylolysis at C3-4 with resultant at least moderate spinal stenosis (series 5, image 13). Finding incompletely evaluated on this exam. Bone marrow signal intensity within normal limits. No scalp soft tissue abnormality. Patient status post left-sided ocular lens replacement. Globes and orbital soft tissues demonstrate no acute finding. Mild mucosal thickening within the ethmoidal air cells and maxillary sinuses. Paranasal sinuses are otherwise clear. Trace right mastoid effusion noted, of doubtful significance. MRA NECK FINDINGS Source time-of-flight imaging demonstrates patent antegrade flow within both carotid and vertebral arteries bilaterally. No evidence for arterial dissection on axial T1 fat-sat imaging. Visualized aortic arch of normal caliber with normal branch pattern. No hemodynamically significant stenosis about the origin of the great vessels. Visualized subclavian arteries widely patent. Right common carotid artery patent from its origin to the bifurcation without stenosis. Atheromatous irregularity at the proximal right ICA with relatively mild 20-25% stenosis by NASCET criteria. Right ICA widely patent distally to the skull base. Left common carotid artery patent from its origin to the bifurcation without stenosis. Short-segment mild approximate 25% stenosis at the origin of the left ICA. Left ICA  otherwise widely patent to the skull base. Both of the vertebral arteries arise from the subclavian arteries. Short-segment approximate 60% stenosis at the origin of the right vertebral artery. Relatively mild no more than 20% narrowing at the origin of left vertebral artery. Vertebral arteries otherwise widely patent within the neck without stenosis or occlusion. IMPRESSION: MRI HEAD IMPRESSION: 1. No acute intracranial abnormality. No evidence for intracranial infection. 2. Mild age-related cerebral atrophy with chronic small vessel ischemic disease.  MRA NECK IMPRESSION: 1. Negative MRA with no evidence for acute abnormality about the major arterial vasculature of the neck. 2. Atheromatous stenoses about the proximal ICAs bilaterally with associated narrowing of up to approximately 20-25% by NASCET criteria. 3. Short-segment approximate 60% atheromatous stenosis at the origin of the right vertebral artery. Relatively mild no more than 25% narrowing at the origin of the left vertebral artery. Vertebral arteries otherwise widely patent within the neck. Electronically Signed   By: Jeannine Boga M.D.   On: 07/26/2018 23:06   Mr Jeri Cos BS Contrast  Result Date: 07/26/2018 CLINICAL DATA:  Initial evaluation for acute unexplained altered mental status, slowly progressive weakness, neck stiffness. EXAM: MRI HEAD WITHOUT AND WITH CONTRAST MRA NECK WITHOUT AND WITH CONTRAST TECHNIQUE: Multiplanar, multiecho pulse sequences of the brain and surrounding structures were obtained without and with intravenous contrast. Angiographic images of the neck were obtained using MRA technique without and with intravenous contrast. Carotid stenosis measurements (when applicable) are obtained utilizing NASCET criteria, using the distal internal carotid diameter as the denominator. CONTRAST:  10 cc of Gadavist. COMPARISON:  None available. FINDINGS: MRI HEAD FINDINGS Diffuse prominence of the CSF containing spaces compatible  generalized age-related cerebral atrophy. Mild chronic microvascular changes present within the periventricular white matter. No abnormal foci of restricted diffusion to suggest acute or subacute ischemia. Gray-white matter differentiation maintained. No encephalomalacia to suggest chronic infarction. No foci of susceptibility artifact to suggest acute or chronic intracranial hemorrhage. No mass lesion, midline shift or mass effect. No hydrocephalus. No extra-axial fluid collection. Pituitary gland within normal limits. Midline structures intact and normal. No abnormal enhancement. No findings to suggest meningitis or intracranial infection. Hand healed and hand Major intracranial vascular flow voids are well maintained. Craniocervical junction within normal limits. Degenerative spondylolysis at C3-4 with resultant at least moderate spinal stenosis (series 5, image 13). Finding incompletely evaluated on this exam. Bone marrow signal intensity within normal limits. No scalp soft tissue abnormality. Patient status post left-sided ocular lens replacement. Globes and orbital soft tissues demonstrate no acute finding. Mild mucosal thickening within the ethmoidal air cells and maxillary sinuses. Paranasal sinuses are otherwise clear. Trace right mastoid effusion noted, of doubtful significance. MRA NECK FINDINGS Source time-of-flight imaging demonstrates patent antegrade flow within both carotid and vertebral arteries bilaterally. No evidence for arterial dissection on axial T1 fat-sat imaging. Visualized aortic arch of normal caliber with normal branch pattern. No hemodynamically significant stenosis about the origin of the great vessels. Visualized subclavian arteries widely patent. Right common carotid artery patent from its origin to the bifurcation without stenosis. Atheromatous irregularity at the proximal right ICA with relatively mild 20-25% stenosis by NASCET criteria. Right ICA widely patent distally to the  skull base. Left common carotid artery patent from its origin to the bifurcation without stenosis. Short-segment mild approximate 25% stenosis at the origin of the left ICA. Left ICA otherwise widely patent to the skull base. Both of the vertebral arteries arise from the subclavian arteries. Short-segment approximate 60% stenosis at the origin of the right vertebral artery. Relatively mild no more than 20% narrowing at the origin of left vertebral artery. Vertebral arteries otherwise widely patent within the neck without stenosis or occlusion. IMPRESSION: MRI HEAD IMPRESSION: 1. No acute intracranial abnormality. No evidence for intracranial infection. 2. Mild age-related cerebral atrophy with chronic small vessel ischemic disease. MRA NECK IMPRESSION: 1. Negative MRA with no evidence for acute abnormality about the major arterial vasculature of the neck. 2. Atheromatous stenoses about  the proximal ICAs bilaterally with associated narrowing of up to approximately 20-25% by NASCET criteria. 3. Short-segment approximate 60% atheromatous stenosis at the origin of the right vertebral artery. Relatively mild no more than 25% narrowing at the origin of the left vertebral artery. Vertebral arteries otherwise widely patent within the neck. Electronically Signed   By: Jeannine Boga M.D.   On: 07/26/2018 23:06   Mr Thoracic Spine Wo Contrast  Result Date: 07/01/2018 CLINICAL DATA:  78 y/o M; worsening back pain, confusion, generalized weakness over the past couple of days. EXAM: MRI THORACIC AND LUMBAR SPINE WITHOUT CONTRAST TECHNIQUE: Multiplanar and multiecho pulse sequences of the thoracic and lumbar spine were obtained without intravenous contrast. The patient was confused and moving, unable to continue the exam, lumbar axial sequences were not acquired. COMPARISON:  06/29/2018 CT abdomen pelvis. FINDINGS: MRI THORACIC SPINE FINDINGS Alignment:  Physiologic. Vertebrae: No fracture, evidence of discitis, or  bone lesion. Cord:  Normal signal and morphology. Paraspinal and other soft tissues: Negative. Disc levels: Mild multilevel disc and facet degenerative changes. No significant foraminal or canal stenosis. MRI LUMBAR SPINE FINDINGS Segmentation:  Standard. Alignment:  Straightening of lumbar lordosis.  No listhesis. Vertebrae: No fracture, evidence of discitis, or bone lesion. Bilateral L3-4 facet edema, greater on the right, with edema in surrounding soft tissues, likely related to degenerative facet arthritis. Conus medullaris and cauda equina: Conus extends to the L1 level. Conus and cauda equina appear normal. Paraspinal and other soft tissues: Negative. Disc levels: Sagittal sequences only. 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 L1-2: No significant disc displacement, foraminal stenosis, or canal stenosis. L2-3: Disc bulge with left-greater-than-right facet hypertrophy. Mild left foraminal stenosis. Moderate to severe spinal canal stenosis. L3-4: Disc bulge with advanced bilateral facet hypertrophy, greater on the right. Moderate bilateral foraminal stenosis. Severe spinal canal stenosis. L4-5: Disc bulge with endplate marginal osteophytes and advanced facet hypertrophy. Moderate right and severe left foraminal stenosis. Mild spinal canal stenosis. L5-S1: Disc bulge with endplate marginal osteophytes and advanced bilateral facet hypertrophy. Moderate to severe bilateral foraminal stenosis. Mild spinal canal stenosis. The disc bulge narrows the bilateral recesses with contact on the descending S1 nerve roots. IMPRESSION: MR THORACIC SPINE IMPRESSION 1. No acute osseous or cord signal abnormality. 2. Mild multilevel discogenic degenerative changes of the thoracic spine. No foraminal or canal stenosis. MR LUMBAR SPINE IMPRESSION 1. Sagittal sequences only. 2. No acute osseous abnormality or malalignment. 3. Congenital lumbar spinal canal stenosis with short pedicles and lumbar spondylosis with  advanced facet arthropathy greatest at L2-3 and L3-4. 4. Bilateral L3-4 facet edema, likely degenerative. 5. Moderate to severe L2-3 and severe L3-4 spinal canal stenosis. Multilevel high-grade foraminal stenosis. Electronically Signed   By: Kristine Garbe M.D.   On: 07/01/2018 03:47   Mr Lumbar Spine Wo Contrast  Result Date: 07/01/2018 CLINICAL DATA:  78 y/o M; worsening back pain, confusion, generalized weakness over the past couple of days. EXAM: MRI THORACIC AND LUMBAR SPINE WITHOUT CONTRAST TECHNIQUE: Multiplanar and multiecho pulse sequences of the thoracic and lumbar spine were obtained without intravenous contrast. The patient was confused and moving, unable to continue the exam, lumbar axial sequences were not acquired. COMPARISON:  06/29/2018 CT abdomen pelvis. FINDINGS: MRI THORACIC SPINE FINDINGS Alignment:  Physiologic. Vertebrae: No fracture, evidence of discitis, or bone lesion. Cord:  Normal signal and morphology. Paraspinal and other soft tissues: Negative. Disc levels: Mild multilevel disc and facet degenerative changes. No significant foraminal or canal stenosis. MRI LUMBAR SPINE  FINDINGS Segmentation:  Standard. Alignment:  Straightening of lumbar lordosis.  No listhesis. Vertebrae: No fracture, evidence of discitis, or bone lesion. Bilateral L3-4 facet edema, greater on the right, with edema in surrounding soft tissues, likely related to degenerative facet arthritis. Conus medullaris and cauda equina: Conus extends to the L1 level. Conus and cauda equina appear normal. Paraspinal and other soft tissues: Negative. Disc levels: Sagittal sequences only. 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 L1-2: No significant disc displacement, foraminal stenosis, or canal stenosis. L2-3: Disc bulge with left-greater-than-right facet hypertrophy. Mild left foraminal stenosis. Moderate to severe spinal canal stenosis. L3-4: Disc bulge with advanced bilateral facet  hypertrophy, greater on the right. Moderate bilateral foraminal stenosis. Severe spinal canal stenosis. L4-5: Disc bulge with endplate marginal osteophytes and advanced facet hypertrophy. Moderate right and severe left foraminal stenosis. Mild spinal canal stenosis. L5-S1: Disc bulge with endplate marginal osteophytes and advanced bilateral facet hypertrophy. Moderate to severe bilateral foraminal stenosis. Mild spinal canal stenosis. The disc bulge narrows the bilateral recesses with contact on the descending S1 nerve roots. IMPRESSION: MR THORACIC SPINE IMPRESSION 1. No acute osseous or cord signal abnormality. 2. Mild multilevel discogenic degenerative changes of the thoracic spine. No foraminal or canal stenosis. MR LUMBAR SPINE IMPRESSION 1. Sagittal sequences only. 2. No acute osseous abnormality or malalignment. 3. 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 4. Bilateral L3-4 facet edema, likely degenerative. 5. Moderate to severe L2-3 and severe L3-4 spinal canal stenosis. Multilevel high-grade foraminal stenosis. Electronically Signed   By: Kristine Garbe M.D.   On: 07/01/2018 03:47   Mr Cervical Spine W Wo Contrast  Result Date: 07/27/2018 CLINICAL DATA:  Quadriplegia for greater than 48 hours. No sensory response below the neck. Sepsis with methicillin sensitive Staph aureus. Multiple falls prior to quadriplegia. Dementia. EXAM: MRI CERVICAL AND THORACIC SPINE WITHOUT AND WITH CONTRAST TECHNIQUE: Multiplanar and multiecho pulse sequences of the cervical spine, to include the craniocervical junction and cervicothoracic junction, and thoracic spine, were obtained without and with intravenous contrast. CONTRAST:  Gadavist 10 mL. COMPARISON:  CT cervical spine 07/01/2018. MRI thoracic spine 07/01/2018. MRI lumbar spine 07/01/2018. MRA extracranial and intracranial 07/26/2018. FINDINGS: The patient  was unable to remain motionless for the exam. Small or subtle lesions could be overlooked. Yo MRI CERVICAL SPINE FINDINGS Alignment: Anatomic Vertebrae: Congenital C2-C3 fusion/assimilation. Discal hyperintensity at C3-4 with subtle enhancement involving the C3 and C4 vertebral bodies suggesting osteomyelitis/discitis. Cord: The cord is contacted, and slightly compressed, by a ventral epidural abscess epicenter C2-C3, measuring 12 x 5 x 22 mm. The cord is equivocally hyperintense opposite C2 and C3; see sagittal STIR series 4, image 7. There may be a second small epidural abscess in the ventral epidural space opposite C6, with mild to moderate cord flattening related to spondylotic ridging at C5-C6 but no clear abnormality of cord signal. Posterior Fossa, vertebral arteries, paraspinal tissues: No tonsillar herniation. Prevertebral enhancement extends from the foramen magnum to C6 representing retropharyngeal phlegmon. Features of retropharyngeal abscess extend from C3 through C5. Disc levels: C2-3: Rudimentary. Stenosis with cord flattening secondary to epidural abscess. C3-4: Suspected discitis and regional osteomyelitis. Osseous ridging results in canal stenosis and cord flattening. Prevertebral retropharyngeal abscess. C4-5: Spondylotic ridging contributes to mild stenosis and foraminal narrowing. C5-6: Discal hyperintensity in the setting of severe loss of disc height could represent additional disk infection. No visible adjacent osteomyelitis. Osseous ridging contributes to stenosis,  below which there is possible additional small epidural abscess. C6-7:  Disc space narrowing, annular bulge. C7-T1:  Unremarkable. THORACIC SPINE FINDINGS Segmentation:  Standard Alignment:  Physiologic Vertebrae: No fracture. Subtle endplate hyperintensity superiorly at T12 associated with slight abnormal T11-T12 discal hyperintensity, concerning for early T12 osteomyelitis. These changes were not present previously. Cord: No  abnormal cord signal. Paraspinal and other soft tissues: Dependent atelectasis versus consolidation RIGHT lower lobe. Disc levels: Disc spaces are unremarkable or display only mild spondylosis except for T11-12, mildly hyperintense, concerning for early discitis. IMPRESSION: MRI CERVICAL SPINE: Suspected C3-4 diskitis, C3 and C4 osteomyelitis, 12 x 5 x 22 mm C2-3 ventral epidural abscess, as well as extensive retropharyngeal phlegmon and abscess. Possible increased cord signal at C2 and C3, but no features to suggest posttraumatic cord contusion. Mild cord flattening at this level related to the adjacent epidural abscess. Multilevel spondylosis with areas of stenosis due to osseous ridging. Small ventral epidural collection at C5-6 not clearly compressive. MRI THORACIC SPINE: No thoracic epidural abscess. T11-T12 discal hyperintensity and abnormal edema in the T12 vertebral body, could represent early discitis and osteomyelitis. Dependent atelectasis versus consolidation RIGHT lower lobe. Chest radiograph recommended. A call has been placed to the ordering provider. Electronically Signed   By: Staci Righter M.D.   On: 07/27/2018 21:05   Mr Thoracic Spine W Wo Contrast  Result Date: 07/27/2018 CLINICAL DATA:  Quadriplegia for greater than 48 hours. No sensory response below the neck. Sepsis with methicillin sensitive Staph aureus. Multiple falls prior to quadriplegia. Dementia. EXAM: MRI CERVICAL AND THORACIC SPINE WITHOUT AND WITH CONTRAST TECHNIQUE: Multiplanar and multiecho pulse sequences of the cervical spine, to include the craniocervical junction and cervicothoracic junction, and thoracic spine, were obtained without and with intravenous contrast. CONTRAST:  Gadavist 10 mL. COMPARISON:  CT cervical spine 07/01/2018. MRI thoracic spine 07/01/2018. MRI lumbar spine 07/01/2018. MRA extracranial and intracranial 07/26/2018. FINDINGS: The patient was unable to remain motionless for the exam. Small or subtle  lesions could be overlooked. Yo MRI CERVICAL SPINE FINDINGS Alignment: Anatomic Vertebrae: Congenital C2-C3 fusion/assimilation. Discal hyperintensity at C3-4 with subtle enhancement involving the C3 and C4 vertebral bodies suggesting osteomyelitis/discitis. Cord: The cord is contacted, and slightly compressed, by a ventral epidural abscess epicenter C2-C3, measuring 12 x 5 x 22 mm. The cord is equivocally hyperintense opposite C2 and C3; see sagittal STIR series 4, image 7. There may be a second small epidural abscess in the ventral epidural space opposite C6, with mild to moderate cord flattening related to spondylotic ridging at C5-C6 but no clear abnormality of cord signal. Posterior Fossa, vertebral arteries, paraspinal tissues: No tonsillar herniation. Prevertebral enhancement extends from the foramen magnum to C6 representing retropharyngeal phlegmon. Features of retropharyngeal abscess extend from C3 through C5. Disc levels: C2-3: Rudimentary. Stenosis with cord flattening secondary to epidural abscess. C3-4: Suspected discitis and regional osteomyelitis. Osseous ridging results in canal stenosis and cord flattening. Prevertebral retropharyngeal abscess. C4-5: Spondylotic ridging contributes to mild stenosis and foraminal narrowing. C5-6: Discal hyperintensity in the setting of severe loss of disc height could represent additional disk infection. No visible adjacent osteomyelitis. Osseous ridging contributes to stenosis, below which there is possible additional small epidural abscess. C6-7:  Disc space narrowing, annular bulge. C7-T1:  Unremarkable. THORACIC SPINE FINDINGS Segmentation:  Standard Alignment:  Physiologic Vertebrae: No fracture. Subtle endplate hyperintensity superiorly at T12 associated with slight abnormal T11-T12 discal hyperintensity, concerning for early T12 osteomyelitis. These changes were not present previously. Cord: No abnormal cord  signal. Paraspinal and other soft tissues:  Dependent atelectasis versus consolidation RIGHT lower lobe. Disc levels: Disc spaces are unremarkable or display only mild spondylosis except for T11-12, mildly hyperintense, concerning for early discitis. IMPRESSION: MRI CERVICAL SPINE: Suspected C3-4 diskitis, C3 and C4 osteomyelitis, 12 x 5 x 22 mm C2-3 ventral epidural abscess, as well as extensive retropharyngeal phlegmon and abscess. Possible increased cord signal at C2 and C3, but no features to suggest posttraumatic cord contusion. Mild cord flattening at this level related to the adjacent epidural abscess. Multilevel spondylosis with areas of stenosis due to osseous ridging. Small ventral epidural collection at C5-6 not clearly compressive. MRI THORACIC SPINE: No thoracic epidural abscess. T11-T12 discal hyperintensity and abnormal edema in the T12 vertebral body, could represent early discitis and osteomyelitis. Dependent atelectasis versus consolidation RIGHT lower lobe. Chest radiograph recommended. A call has been placed to the ordering provider. Electronically Signed   By: Staci Righter M.D.   On: 07/27/2018 21:05   US Abdomen Complete  Result Date: 07/23/2018 CLINICAL DATA:  Elevated liver function tests. EXAM: ABDOMEN ULTRASOUND COMPLETE COMPARISON:  CT of the abdomen without contrast on 07/22/2018 FINDINGS: Gallbladder: No gallstones or wall thickening visualized. No sonographic Murphy sign noted by sonographer. Common bile duct: Diameter: 5 mm. Liver: The liver demonstrates coarse echotexture and increased echogenicity, likely reflecting diffuse steatosis. No overt cirrhotic contour abnormalities or focal lesions are identified. There is no evidence of intrahepatic biliary ductal dilatation. Left lobe hepatic cyst shows mild internal septation and measures approximately 2.6 cm in greatest diameter. No solid masses appreciated by ultrasound. Portal vein is patent on color Doppler imaging with normal direction of blood flow towards the liver.  IVC: No abnormality visualized. Pancreas: Visualized portion unremarkable. Spleen: Size and appearance within normal limits. Right Kidney: Length: 12.6 cm. Echogenicity within normal limits. No solid mass or hydronephrosis visualized. 3 cm cyst of the upper pole has a benign appearance by ultrasound. Left Kidney: Length: 12.9 cm. Echogenicity within normal limits. No mass or hydronephrosis visualized. Abdominal aorta: No aneurysm visualized. Other findings: None. IMPRESSION: Increased echogenicity of the liver parenchyma consistent with diffuse steatosis. No evidence of biliary obstruction. Simple cyst in the left lobe of the liver. Electronically Signed   By: Aletta Edouard M.D.   On: 07/23/2018 08:28   Dg Chest Port 1 View  Result Date: 07/22/2018 CLINICAL DATA:  Per EMS-states recurrent UTI and has been on 3 different antibiotics and not getting better, possible sepsis. EXAM: PORTABLE CHEST - 1 VIEW COMPARISON:  07/01/2018 and previous FINDINGS: Relatively low lung volumes. Mild atelectasis at the lung bases. Heart size upper limits normal for technique. Aortic Atherosclerosis (ICD10-170.0). Blunting of right lateral costophrenic angle, cannot exclude small effusion. Bilateral shoulder DJD. IMPRESSION: 1. Low lung volumes with bibasilar atelectasis. 2. Possible small right effusion. Electronically Signed   By: Lucrezia Europe M.D.   On: 07/22/2018 10:57   Dg Fluoro Guided Needle Plc Aspiration/injection Loc  Result Date: 07/27/2018 CLINICAL DATA:  Confusion.  Unable to move arms in legs. EXAM: DIAGNOSTIC LUMBAR PUNCTURE UNDER FLUOROSCOPIC GUIDANCE FLUOROSCOPY TIME:  Fluoroscopy Time:  42 seconds Radiation Exposure Index (if provided by the fluoroscopic device): Is 16.2 mGy Number of Acquired Spot Images: 0 PROCEDURE: Informed consent was obtained from the patient prior to the procedure, including potential complications of headache, allergy, and pain. With the patient prone, the lower back was prepped with  Betadine. 1% Lidocaine was used for local anesthesia. Lumbar puncture was performed initially at the  surgical level at L4-5 using a 20 gauge needle. There was slow return of blood tinged fluid. Opening pressure very low, approximately 12-13 cm of water. Very little fluid could be collected due to the very low pressures despite tilting the head up. Therefore, the needle was repositioned at a 2nd level at L3-4. Same blood tension fluid returned, with same low pressure. Very little fluid could be collected. A total of approximately 1 mL of blood-tinged cloudy fluid was collected. Fluid was sent for laboratory testing. The patient tolerated the procedure well and there were no apparent complications. IMPRESSION: Difficult lumbar puncture under fluoroscopic guidance due to very low pressures. Only a small amount of blood-tinged cloudy fluid could be collected despite tilting head significantly upward. Electronically Signed   By: Rolm Baptise M.D.   On: 07/27/2018 14:47   Ct Renal Stone Study  Result Date: 07/22/2018 CLINICAL DATA:  Flank pain.  Urinary tract infection. EXAM: CT ABDOMEN AND PELVIS WITHOUT CONTRAST TECHNIQUE: Multidetector CT imaging of the abdomen and pelvis was performed following the standard protocol without IV contrast. COMPARISON:  06/29/2018 FINDINGS: Lower chest: Small bilateral pleural effusions with dependent atelectasis. Hepatobiliary: Diffuse hepatic steatosis. Lobulated cyst in the lateral segment of the left lobe of the liver is stable. Gallbladder is within normal limits. Pancreas: Unremarkable Spleen: Unremarkable Adrenals/Urinary Tract: There is no hydronephrosis. No urinary calculus. There are simple cysts in both kidneys. These are not significantly changed. Bladder is grossly within normal limits. Stomach/Bowel: Sigmoid diverticulosis. No obvious mass in the colon. No evidence of small-bowel obstruction. Small hiatal hernia is noted. Vascular/Lymphatic: Atherosclerotic vascular  calcifications are seen. No abnormal retroperitoneal adenopathy. Reproductive: Normal prostate. Other: No free fluid. Musculoskeletal: No vertebral compression deformity. Postoperative changes from L5 decompression. IMPRESSION: No evidence of urinary calculus or urinary obstruction. Small pleural effusions and dependent atelectasis. Chronic changes are noted. Electronically Signed   By: Marybelle Killings M.D.   On: 07/22/2018 16:10   Ct Angio Abd/pel W And/or Wo Contrast  Result Date: 06/29/2018 CLINICAL DATA:  Low back pain beginning this morning. Evaluate for abdominal aortic aneurysm. EXAM: CT ANGIOGRAPHY ABDOMEN AND PELVIS WITH CONTRAST AND WITHOUT CONTRAST TECHNIQUE: Multidetector CT imaging of the abdomen and pelvis was performed using the standard protocol during bolus administration of intravenous contrast. Multiplanar reconstructed images and MIPs were obtained and reviewed to evaluate the vascular anatomy. CONTRAST:  15mL ISOVUE-370 IOPAMIDOL (ISOVUE-370) INJECTION 76% COMPARISON:  CT abdomen and pelvis - 04/27/2016; 03/01/2013; 07/12/2003 FINDINGS: VASCULAR Aorta: Moderate amount of predominantly calcified atherosclerotic plaque within a normal caliber abdominal aorta, not resulting in hemodynamically significant stenosis. No abdominal aortic dissection or periaortic stranding. Celiac: There is a minimal amount eccentric calcified plaque about the origin of the celiac artery, not resulting in hemodynamically significant stenosis. Conventional branching pattern. SMA: There is a minimal amount of mixed calcified and noncalcified atherosclerotic plaque involving the origin and proximal aspect of the SMA, not resulting in hemodynamically significant stenosis. Conventional branching pattern. The distal tributaries of the SMA appear widely patent without discrete intraluminal filling defect suggest distal embolism. Renals: Solitary bilaterally; there is a moderate amount of eccentric calcified atherosclerotic  plaque involving the origin of the bilateral renal arteries which approaches 50% luminal narrowing bilaterally though is without associated asymmetric renal atrophy or perinephric stranding. IMA: Widely patent. Inflow: There is a minimal amount of crescentic calcified atherosclerotic plaque within the bilateral normal caliber common iliac arteries, not resulting in hemodynamically significant stenosis. The bilateral external iliac arteries are tortuous though  widely patent of normal caliber. The bilateral internal iliac arteries are mildly disease though patent and of normal caliber. Proximal Outflow: There is a minimal amount of eccentric probably calcified atherosclerotic plaque involving the bilateral common femoral arteries, not resulting in hemodynamically significant stenosis. The bilateral superficial and deep femoral arteries appear widely patent throughout their imaged courses. Veins: The IVC and pelvic venous system appear widely patent on this arterial phase examination. Review of the MIP images confirms the above findings. _________________________________________________________ NON-VASCULAR Lower chest: Limited visualization of the lower thorax demonstrates minimal subsegmental atelectasis within the image right lower lobe. No focal airspace opacities. No pleural effusion. Hepatobiliary: There is diffuse decreased attenuation of the hepatic parenchyma on this postcontrast examination suggestive of hepatic steatosis. No discrete hyperenhancing hepatic lesions. Normal appearance of the gallbladder given degree distention. No radiopaque gallstones. No ascites. Pancreas: Normal appearance of the pancreas Spleen: Normal appearance of the spleen Adrenals/Urinary Tract: There is symmetric enhancement of the bilateral kidneys. Note is made of an approximately 2.4 cm hypoattenuating (11 Hounsfield unit) left-sided renal cyst (image 37, series 5 as well as an approximately 3.3 cm exophytic hypoattenuating  right-sided renal cyst (image 24). There is geographic cortical atrophy involving the superior pole of the right kidney (image coronal image 134, series 8). No discrete worrisome renal lesions. No definite evidence of nephrolithiasis on this postcontrast examination. There is a very minimal amount of likely age and body habitus related perinephric stranding. No urine obstruction. There is mild thickening of the bilateral adrenal glands without discrete nodule. Normal appearance of the urinary bladder given degree of distention. Stomach/Bowel: Scattered colonic diverticulosis primarily involving the distal aspect of the descending and sigmoid colon, without evidence of superimposed acute diverticulitis. Normal appearance of the terminal ileum and appendix. Moderate size hiatal hernia. No pneumoperitoneum, pneumatosis or portal venous gas. Lymphatic: No bulky retroperitoneal, mesenteric, pelvic or inguinal lymphadenopathy. Reproductive: Dystrophic calcifications within normal sized prostate gland, though note, evaluation degraded secondary streak artifact from the patient's right total hip prosthesis. Other: Minimal amount of subcutaneous edema about the midline of the low back. Musculoskeletal: No definite acute or aggressive osseous abnormalities. Post right total hip replacement without definitive evidence of hardware failure loosening though note the caudal aspect of the right femoral stem component was not imaged. There are multiple peripherally calcified ossicles superior to the right greater trochanter likely the sequela of prior trauma and/or iatrogenic. Severe degenerative change of the contralateral left hip with joint space loss, subchondral sclerosis and osteophytosis. No evidence avascular necrosis. Moderate severe degenerative change throughout the lumbar spine. Stigmata of DISH throughout the thoracic and lumbar spine. Post L5 laminectomy with linear calcifications posterior to the sacrum with  associated linear subcutaneous scarring. IMPRESSION: VASCULAR 1. No definite explanation for patient's acute back pain. Specifically, no evidence of abdominal aortic aneurysm or dissection. 2.  Aortic Atherosclerosis (ICD10-I70.0). 3. Suspected hemodynamically significant stenosis ease involving the bilateral renal arteries without associated asymmetric renal atrophy or delayed renal enhancement. NON-VASCULAR 1. Colonic diverticulosis without evidence of superimposed acute diverticulitis. 2. Suspected hepatic steatosis. Correlation with LFTs could be performed as clinically indicated. 3. Moderate size hiatal hernia. Electronically Signed   By: Sandi Mariscal M.D.   On: 06/29/2018 18:53    PHYSICAL EXAM  Temp:  [97.9 F (36.6 C)-98.8 F (37.1 C)] 97.9 F (36.6 C) (01/09 0453) Pulse Rate:  [77-78] 77 (01/09 0453) Resp:  [20-22] 22 (01/09 0453) BP: (155-157)/(69-79) 155/79 (01/09 0453) SpO2:  [94 %-96 %] 96 % (01/09 0453)  General - well nourished, well developed, in no apparent distress.    Ophthalmologic - fundi not visualized due to noncooperation.    Cardiovascular - regular rate and rhythm  Neuro - awake alert, eyes open, able to follow simple commands.  Not orientated to place, time or people.  Moderate dysarthria and hypophonia, able to name 3 out of 4, able to repeat but paucity of speech.  PERRL, EOMI, visual field full, facial symmetrical, tongue midline.  No tenderness on neck palpation.  Bilateral upper and lower extremity flaccid, 0/5, diminished muscle tone, DTR diminished, no Babinski.  No ankle clonus.  Sensation not corporative, but appears to be decreased to pain stimulation bilaterally, no significant sensation level or not corporative on sensation testing.  DTR diminished, no Babinski.  Gait not tested.  Foley catheter in place.   ASSESSMENT Ricky Black is a 78 y.o. male with history of dementia, hypertension, recurrent MSSA UTI, lower back pain admitted for fever,  altered mental status and progressive weakness.  He was found to have recurrent MSSA UTI, bacteremia, urinary retention, encephalopathy and quadriplegia.  He is encephalopathy most likely due to UTI and sepsis, now much improved after IV Abx, will d/c EEG.  ID on board on Rocephin patient clinically improved, now afebrile, WBC trending down and neck pain largely resolved.  However still has severe quadriplegia, given the history of neck pain, fever, bacteremia, had MRI C/T-spine with and without contrast which showed C2-3 ventral epidural abscess, C3-4 diskitis and C3-4 osteomyelitis. NSG consulted and pt not surgical candidate.   PLAN:  Agree with further palliative care discussion to consider palliative care in this setting   Continue IV antibiotics as per ID  D/c EEG  Supportive care.  I had long discussion with 2 daughters at bedside and one daughter over the phone, updated pt current condition, treatment options and poor prognosis. They expressed understanding and appreciation. I also discussed with Dr. Rodena Piety  Neurology will sign off. Please call with questions. Thanks for the consult.   Hospital day # 6   Rosalin Hawking, MD PhD Stroke Neurology 07/28/2018 1:53 PM  I spent  35 minutes in total face-to-face time with the patient, more than 50% of which was spent in counseling and coordination of care, reviewing test results, images and medication, and discussing the diagnosis of cervical epidural abscess, cervical osteomyelitis and diskitis, quadriplegia, sepsis, and encephalopathy, treatment plan and potential prognosis. This patient's care requiresreview of multiple databases, neurological assessment, discussion with family, other specialists and medical decision making of high complexity.        To contact Stroke Continuity provider, please refer to [email or web address] After hours, contact General Neurology

## 2018-07-28 NOTE — Care Management Important Message (Signed)
Important Message  Patient Details  Name: JAIYON WANDER MRN: [id number] Date of Birth: October 23, 1940   Medicare Important Message Given:  Yes    Kerin Salen 07/28/2018, 10:58 AMImportant Message  Patient Details  Name: TU BAYLE MRN: [id number] Date of Birth: 01/01/1941   Medicare Important Message Given:  Yes    Kerin Salen 07/28/2018, 10:58 AM

## 2018-07-29 LAB — HSV DNA BY PCR (REFERENCE LAB): HSV 1 DNA: NEGATIVE

## 2018-07-29 LAB — VDRL, CSF: VDRL Quant, CSF: NONREACTIVE

## 2018-07-29 LAB — HERPES SIMPLEX VIRUS(HSV) DNA BY PCR: HSV 2 DNA: NEGATIVE

## 2018-07-29 MED ORDER — CETIRIZINE HCL 5 MG/5ML PO SOLN
5.0000 mg | Freq: Every day | ORAL | Status: DC
  Administered 2018-07-29: 5 mg via ORAL
  Filled 2018-07-29 (×2): qty 5

## 2018-07-29 MED ORDER — HALOPERIDOL LACTATE 5 MG/ML IJ SOLN: 0.5000 mg | INTRAMUSCULAR | Status: DC | PRN

## 2018-07-29 MED ORDER — GLYCOPYRROLATE 0.2 MG/ML IJ SOLN
0.2000 mg | INTRAMUSCULAR | Status: DC | PRN
  Filled 2018-07-29: qty 1

## 2018-07-29 MED ORDER — GLYCOPYRROLATE 1 MG PO TABS
1.0000 mg | ORAL_TABLET | ORAL | Status: DC | PRN
  Filled 2018-07-29: qty 1

## 2018-07-29 MED ORDER — LORAZEPAM 2 MG/ML IJ SOLN: 0.5000 mg | Freq: Four times a day (QID) | INTRAMUSCULAR | Status: DC | PRN

## 2018-07-29 MED ORDER — HALOPERIDOL 0.5 MG PO TABS
0.5000 mg | ORAL_TABLET | ORAL | Status: DC | PRN
  Filled 2018-07-29: qty 1

## 2018-07-29 MED ORDER — HALOPERIDOL LACTATE 2 MG/ML PO CONC
0.5000 mg | ORAL | Status: DC | PRN
  Filled 2018-07-29: qty 0.3

## 2018-07-29 MED ORDER — OXYMETAZOLINE HCL 0.05 % NA SOLN
1.0000 | Freq: Two times a day (BID) | NASAL | Status: DC
  Administered 2018-07-29 – 2018-07-30 (×3): 1 via NASAL
  Filled 2018-07-29: qty 15

## 2018-07-29 MED ORDER — BIOTENE DRY MOUTH MT LIQD: 15.0000 mL | OROMUCOSAL | Status: DC | PRN

## 2018-07-29 MED ORDER — POLYVINYL ALCOHOL 1.4 % OP SOLN
1.0000 [drp] | Freq: Four times a day (QID) | OPHTHALMIC | Status: DC | PRN
  Filled 2018-07-29: qty 15

## 2018-07-29 MED ORDER — FLUTICASONE PROPIONATE 50 MCG/ACT NA SUSP
1.0000 | Freq: Every day | NASAL | Status: DC
  Administered 2018-07-29: 1 via NASAL
  Filled 2018-07-29: qty 16

## 2018-07-29 MED ORDER — MORPHINE SULFATE (PF) 2 MG/ML IV SOLN
2.0000 mg | INTRAVENOUS | Status: DC | PRN
  Administered 2018-07-30 (×4): 2 mg via INTRAVENOUS
  Administered 2018-07-31 (×2): 4 mg via INTRAVENOUS
  Administered 2018-07-31: 2 mg via INTRAVENOUS
  Administered 2018-07-31: 4 mg via INTRAVENOUS
  Filled 2018-07-29 (×2): qty 2
  Filled 2018-07-29 (×5): qty 1
  Filled 2018-07-29: qty 2

## 2018-07-29 NOTE — Progress Notes (Signed)
Met with patient and approximately 10 family members at bedside.   Family confirmed full comfort care.  They are requesting transfer to [facility] when bed available.  Family asks that antibiotics be continued until discharge.  He has also had coughing episodes and nasal drip     PE:  Well developed ill appearing male,  Lethargic, awake, appropriate. CV rrr resp no distress Abdomen soft, nt, nd Skin hot to touch.  Recommendations:   Will add afrin, zyrtec, flonase. Shift orders to comfort.  Social work order placed for [employer] referral.  Prognosis - days.  Disposition:  [facility].  Family has my contact information and I encouraged them to call with concerns.  He is receiving excellent RN care.  PMT will check in over the weekend for symptom management.  Florentina Jenny, PA-C Palliative Medicine Pager: [phone]

## 2018-07-29 NOTE — Progress Notes (Signed)
Patient has transitioned to comfort care.   Family would like residential hospice. Preference is [facility].   LCSW made referral to [facility].   LCSW will continue to follow for bed availability.   Carolin Coy [facility]

## 2018-07-29 NOTE — Progress Notes (Signed)
PROGRESS NOTE    Ricky Black  OZD:[id number] DOB: [birth year] DOA: 07/22/2018 PCP: Marton Redwood, MD  Brief Narrative:  78 y.o.Mwith hx dementia, previously home dwelling, HTN, and 2 recent admissions for MSSA UTIwho presents with slow progressive weakness.  Caveat that patient is unable to provide any history, which is collected from wife at bedside instead.   The patient was discharged mid-December for UTI, MSSA, discharged on nitrofurantoin. He was seeming to do better for a time, ambulating short distances, but then in the last 1-2 weeks has started to decline again, no longer getting out of bed, weaker and weaker, less responsive until today he had a fever and was sent to the ER. There has been no cough or respiratory distress. There is been no obvious urinary symptoms. No vomiting or reported abdominal pain. ED course: -Temp 101F, heart rate 91, respirations 31,blood pressure 130/79 -Na139, K4.0, Cr0.9, WBC13.3K, Hgb13 -Transaminases 60s, Tbili 1.4 -INR 1.1 -Lactic acid 1.7 -UA showed many RBCs and WBCs -CXR showed no pneumonia or effusion -CT head unremarkable  Assessment & Plan:   Principal Problem:   Sepsis ([facility]) Active Problems:   Essential hypertension   Dementia (HCC)   Acute encephalopathy   Acute lower UTI   Transaminitis   Neck pain   DNR (do not resuscitate)   Palliative care encounter   Urinary tract infection without hematuria   Staphylococcus aureus bacteremia   Quadriplegia (HCC)   Abscess in epidural space of cervical spine   Cervical discitis    #1 epidural abscess/C3-C4 osteomyelitis/discitis-patient is not able to move his hands or legs.  He is more awake today and appropriately answering questions today.  Appreciate neurosurgery input from urology input and ID input.  #2 MSSA bacteremia continue Rocephin PICC line on hold at this time while family deciding about comfort care.  #3 acute metabolic encephalopathy secondary to  bacteremia.   Estimated body mass index is 30.43 kg/m as calculated from the following:   Height as of this encounter: 6\' 2"  (1.88 m).   Weight as of this encounter: 107.5 kg.  DVT prophylaxis: Lovenox Code Status: DO NOT RESUSCITATE Family Communication: Discussed with patient's daughters and wife Disposition Plan: Patient's family waiting for patient's brother to get here tonight by flight and then go on to hospice care after that palliative care following. Consultants: Infectious disease neurology palliative care  Procedures: Lumbar puncture Antimicrobials: Rocephin  Subjective: Awake when I saw him this morning he said he was hungry not able to move any extremities.  Objective: Vitals:   07/28/18 0453 07/28/18 1538 07/28/18 2039 07/29/18 0609  BP: (!) 155/79 126/74 136/68 (!) 154/82  Pulse: 77 74 71 85  Resp: (!) 22 16 19 14   Temp: 97.9 F (36.6 C) 97.8 F (36.6 C) 98.9 F (37.2 C) 100.1 F (37.8 C)  TempSrc:    Oral  SpO2: 96% 95% 96% 96%  Weight:      Height:        Intake/Output Summary (Last 24 hours) at 07/29/2018 1341 Last data filed at 07/29/2018 0610 Gross per 24 hour  Intake 1340.17 ml  Output 925 ml  Net 415.17 ml   Filed Weights   07/22/18 1232  Weight: 107.5 kg    Examination:  General exam: Appears calm and comfortable  Respiratory system: Clear to auscultation. Respiratory effort normal. Cardiovascular system: S1 & S2 heard, RRR. No JVD, murmurs, rubs, gallops or clicks. No pedal edema. Gastrointestinal system: Abdomen is nondistended, soft and nontender. No  organomegaly or masses felt. Normal bowel sounds heard. Central nervous system: Awake not moving any extremities Extremities: Symmetric 5 x 5 power. Skin: No rashes, lesions or ulcers    Data Reviewed: I have personally reviewed following labs and imaging studies  CBC: Recent Labs  Lab 07/23/18 0658 07/26/18 0533 07/26/18 0633 07/27/18 1009  WBC 11.8* 13.9* 14.4* 12.6*  HGB  11.4* 11.4* 11.0* 10.6*  HCT 36.3* 37.4* 36.1* 35.2*  MCV 104.6* 107.8* 108.4* 110.0*  PLT 180 233 220 614   Basic Metabolic Panel: Recent Labs  Lab 07/23/18 0658 07/26/18 0533 07/26/18 0633 07/27/18 1009  NA 144 148* 147* 145  K 3.6 3.7 3.6 3.6  CL 110 115* 114* 113*  CO2 23 25 25 24   GLUCOSE 194* 153* 163* 221*  BUN 43* 26* 27* 40*  CREATININE 0.81 0.69 0.72 0.75  CALCIUM 8.3* 8.4* 8.4* 8.1*   GFR: Estimated Creatinine Clearance: 97.7 mL/min (by C-G formula based on SCr of 0.75 mg/dL). Liver Function Tests: Recent Labs  Lab 07/23/18 0658 07/26/18 0533 07/27/18 1009  AST 71* 72* 66*  ALT 64* 87* 61*  ALKPHOS 83 95 78  BILITOT 0.5 0.9 0.6  PROT 6.3* 5.6* 5.5*  ALBUMIN 2.1* 1.7* 1.8*   No results for input(s): LIPASE, AMYLASE in the last 168 hours. No results for input(s): AMMONIA in the last 168 hours. Coagulation Profile: No results for input(s): INR, PROTIME in the last 168 hours. Cardiac Enzymes: Recent Labs  Lab 07/26/18 0533  CKTOTAL 48*   BNP (last 3 results) No results for input(s): PROBNP in the last 8760 hours. HbA1C: No results for input(s): HGBA1C in the last 72 hours. CBG: No results for input(s): GLUCAP in the last 168 hours. Lipid Profile: No results for input(s): CHOL, HDL, LDLCALC, TRIG, CHOLHDL, LDLDIRECT in the last 72 hours. Thyroid Function Tests: No results for input(s): TSH, T4TOTAL, FREET4, T3FREE, THYROIDAB in the last 72 hours. Anemia Panel: No results for input(s): VITAMINB12, FOLATE, FERRITIN, TIBC, IRON, RETICCTPCT in the last 72 hours. Sepsis Labs: No results for input(s): PROCALCITON, LATICACIDVEN in the last 168 hours.  Recent Results (from the past 240 hour(s))  Culture, blood (Routine x 2)     Status: Abnormal   Collection Time: 07/22/18 10:05 AM  Result Value Ref Range Status   Specimen Description BLOOD BLOOD RIGHT FOREARM  Final   Special Requests   Final    BOTTLES DRAWN AEROBIC AND ANAEROBIC Blood Culture  adequate volume Performed at [facility] [address] [place], [place] [address]    Culture  Setup Time   Final    IN BOTH AEROBIC AND ANAEROBIC BOTTLES GRAM POSITIVE COCCI CRITICAL RESULT CALLED TO, READ BACK BY AND VERIFIED WITHLavell Luster PHARMD 0086 07/23/18 A BROWNING    Culture STAPHYLOCOCCUS AUREUS (A)  Final   Report Status 07/25/2018 FINAL  Final   Organism ID, Bacteria STAPHYLOCOCCUS AUREUS  Final      Susceptibility   Staphylococcus aureus - MIC*    CIPROFLOXACIN <=0.5 SENSITIVE Sensitive     ERYTHROMYCIN <=0.25 SENSITIVE Sensitive     GENTAMICIN <=0.5 SENSITIVE Sensitive     OXACILLIN <=0.25 SENSITIVE Sensitive     TETRACYCLINE <=1 SENSITIVE Sensitive     VANCOMYCIN <=0.5 SENSITIVE Sensitive     TRIMETH/SULFA 160 RESISTANT Resistant     CLINDAMYCIN <=0.25 SENSITIVE Sensitive     RIFAMPIN <=0.5 SENSITIVE Sensitive     Inducible Clindamycin NEGATIVE Sensitive     * STAPHYLOCOCCUS AUREUS  Blood Culture ID Panel (Reflexed)     Status: Abnormal   Collection Time: 07/22/18 10:05 AM  Result Value Ref Range Status   Enterococcus species NOT DETECTED NOT DETECTED Final   Listeria monocytogenes NOT DETECTED NOT DETECTED Final   Staphylococcus species DETECTED (A) NOT DETECTED Final    Comment: CRITICAL RESULT CALLED TO, READ BACK BY AND VERIFIED WITHLavell Luster PHARMD [id number] 07/23/18 A BROWNING    Staphylococcus aureus (BCID) DETECTED (A) NOT DETECTED Final    Comment: Methicillin (oxacillin) susceptible Staphylococcus aureus (MSSA). Preferred therapy is anti staphylococcal beta lactam antibiotic (Cefazolin or Nafcillin), unless clinically contraindicated. CRITICAL RESULT CALLED TO, READ BACK BY AND VERIFIED WITHLavell Luster PHARMD [id number] 07/23/18 A BROWNING    Methicillin resistance NOT DETECTED NOT DETECTED Final   Streptococcus species NOT DETECTED NOT DETECTED Final   Streptococcus agalactiae NOT DETECTED NOT DETECTED Final   Streptococcus pneumoniae NOT  DETECTED NOT DETECTED Final   Streptococcus pyogenes NOT DETECTED NOT DETECTED Final   Acinetobacter baumannii NOT DETECTED NOT DETECTED Final   Enterobacteriaceae species NOT DETECTED NOT DETECTED Final   Enterobacter cloacae complex NOT DETECTED NOT DETECTED Final   Escherichia coli NOT DETECTED NOT DETECTED Final   Klebsiella oxytoca NOT DETECTED NOT DETECTED Final   Klebsiella pneumoniae NOT DETECTED NOT DETECTED Final   Proteus species NOT DETECTED NOT DETECTED Final   Serratia marcescens NOT DETECTED NOT DETECTED Final   Haemophilus influenzae NOT DETECTED NOT DETECTED Final   Neisseria meningitidis NOT DETECTED NOT DETECTED Final   Pseudomonas aeruginosa NOT DETECTED NOT DETECTED Final   Candida albicans NOT DETECTED NOT DETECTED Final   Candida glabrata NOT DETECTED NOT DETECTED Final   Candida krusei NOT DETECTED NOT DETECTED Final   Candida parapsilosis NOT DETECTED NOT DETECTED Final   Candida tropicalis NOT DETECTED NOT DETECTED Final    Comment: Performed at [facility], [place] [address], [place], [place] [address]  Culture, blood (Routine x 2)     Status: Abnormal   Collection Time: 07/22/18 11:45 AM  Result Value Ref Range Status   Specimen Description   Final    BLOOD LEFT ANTECUBITAL Performed at [facility] [address], [place], [place] [address]    Special Requests   Final    BOTTLES DRAWN AEROBIC AND ANAEROBIC Blood Culture adequate volume Performed at [facility] [address], [place], [place] [address]    Culture  Setup Time   Final    GRAM POSITIVE COCCI IN BOTH AEROBIC AND ANAEROBIC BOTTLES CRITICAL VALUE NOTED.  VALUE IS CONSISTENT WITH PREVIOUSLY REPORTED AND CALLED VALUE. Performed at [facility], [place] [address] [place], [place] [address]    Culture (A)  Final    STAPHYLOCOCCUS AUREUS SUSCEPTIBILITIES PERFORMED ON PREVIOUS CULTURE WITHIN THE LAST 5 DAYS.    Report Status 07/25/2018 FINAL   Final  Urine culture     Status: Abnormal   Collection Time: 07/22/18 12:36 PM  Result Value Ref Range Status   Specimen Description   Final    URINE, CATHETERIZED Performed at [facility], [address] [address], [place], [place] [address]    Special Requests   Final    Normal Performed at [facility], [place] [address], [place], [place] [address]    Culture >=100,000 COLONIES/mL STAPHYLOCOCCUS AUREUS (A)  Final   Report Status 07/24/2018 FINAL  Final   Organism ID, Bacteria STAPHYLOCOCCUS AUREUS (A)  Final  Susceptibility   Staphylococcus aureus - MIC*    CIPROFLOXACIN <=0.5 SENSITIVE Sensitive     GENTAMICIN <=0.5 SENSITIVE Sensitive     NITROFURANTOIN <=16 SENSITIVE Sensitive     OXACILLIN <=0.25 SENSITIVE Sensitive     TETRACYCLINE <=1 SENSITIVE Sensitive     VANCOMYCIN <=0.5 SENSITIVE Sensitive     TRIMETH/SULFA 80 RESISTANT Resistant     CLINDAMYCIN <=0.25 SENSITIVE Sensitive     RIFAMPIN <=0.5 SENSITIVE Sensitive     Inducible Clindamycin NEGATIVE Sensitive     * >=100,000 COLONIES/mL STAPHYLOCOCCUS AUREUS  MRSA PCR Screening     Status: Abnormal   Collection Time: 07/23/18  2:03 AM  Result Value Ref Range Status   MRSA by PCR POSITIVE (A) NEGATIVE Final    Comment:        The GeneXpert MRSA Assay (FDA approved for NASAL specimens only), is one component of a comprehensive MRSA colonization surveillance program. It is not intended to diagnose MRSA infection nor to guide or monitor treatment for MRSA infections. RESULT CALLED TO, READ BACK BY AND VERIFIED WITH: HILL,D RN @0353  ON 07/23/18 JACKSON,K Performed at [facility], [place] [address], [place], [place] [address]   Culture, blood (routine x 2)     Status: None (Preliminary result)   Collection Time: 07/25/18  5:04 PM  Result Value Ref Range Status   Specimen Description   Final    BLOOD LEFT HAND Performed at [facility]  [address], [place], [place] [address]    Special Requests   Final    BOTTLES DRAWN AEROBIC AND ANAEROBIC Blood Culture adequate volume Performed at [facility] [address] [place], [place] [address]    Culture   Final    NO GROWTH 4 DAYS Performed at [facility], [place] [address] [place], [place] [address]    Report Status PENDING  Incomplete  Culture, blood (routine x 2)     Status: None (Preliminary result)   Collection Time: 07/25/18  5:21 PM  Result Value Ref Range Status   Specimen Description   Final    BLOOD LEFT HAND Performed at [facility] [address], [place], [place] [address]    Special Requests   Final    BOTTLES DRAWN AEROBIC AND ANAEROBIC Blood Culture adequate volume Performed at [facility] [address], [place], [place] [address]    Culture   Final    NO GROWTH 4 DAYS Performed at [facility], [place] [address], [place], [place] [address]    Report Status PENDING  Incomplete  CSF culture     Status: None (Preliminary result)   Collection Time: 07/27/18  2:29 PM  Result Value Ref Range Status   Specimen Description   Final    CSF Performed at [facility] [address], [place], [place] [address]    Special Requests   Final    NONE Performed at [facility], [place] [address] [place], [place] [address]    Gram Stain   Final    NO WBC SEEN NO ORGANISMS SEEN CYTOSPIN SMEAR Gram Stain Report Called to,Read Back By and Verified With: C.TRAN AT 1559 ON 07/27/18 BY N.THOMPSON Performed at [name], [place] [address], [place], [place] [address]    Culture   Final    NO GROWTH < 24 HOURS Performed at [facility] [address] [place], [place]  [address]    Report Status PENDING  Incomplete         Radiology Studies: Mr Cervical Spine W Wo Contrast  Result Date: 07/27/2018 CLINICAL DATA:  Quadriplegia for  greater than 48 hours. No sensory response below the neck. Sepsis with methicillin sensitive Staph aureus. Multiple falls prior to quadriplegia. Dementia. EXAM: MRI CERVICAL AND THORACIC SPINE WITHOUT AND WITH CONTRAST TECHNIQUE: Multiplanar and multiecho pulse sequences of the cervical spine, to include the craniocervical junction and cervicothoracic junction, and thoracic spine, were obtained without and with intravenous contrast. CONTRAST:  Gadavist 10 mL. COMPARISON:  CT cervical spine 07/01/2018. MRI thoracic spine 07/01/2018. MRI lumbar spine 07/01/2018. MRA extracranial and intracranial 07/26/2018. FINDINGS: The patient was unable to remain motionless for the exam. Small or subtle lesions could be overlooked. Yo MRI CERVICAL SPINE FINDINGS Alignment: Anatomic Vertebrae: Congenital C2-C3 fusion/assimilation. Discal hyperintensity at C3-4 with subtle enhancement involving the C3 and C4 vertebral bodies suggesting osteomyelitis/discitis. Cord: The cord is contacted, and slightly compressed, by a ventral epidural abscess epicenter C2-C3, measuring 12 x 5 x 22 mm. The cord is equivocally hyperintense opposite C2 and C3; see sagittal STIR series 4, image 7. There may be a second small epidural abscess in the ventral epidural space opposite C6, with mild to moderate cord flattening related to spondylotic ridging at C5-C6 but no clear abnormality of cord signal. Posterior Fossa, vertebral arteries, paraspinal tissues: No tonsillar herniation. Prevertebral enhancement extends from the foramen magnum to C6 representing retropharyngeal phlegmon. Features of retropharyngeal abscess extend from C3 through C5. Disc levels: C2-3: Rudimentary. Stenosis with cord flattening secondary to epidural abscess. C3-4: Suspected discitis and regional osteomyelitis. Osseous ridging results in canal stenosis and cord flattening. Prevertebral retropharyngeal abscess. C4-5: Spondylotic ridging contributes to mild stenosis and foraminal  narrowing. C5-6: Discal hyperintensity in the setting of severe loss of disc height could represent additional disk infection. No visible adjacent osteomyelitis. Osseous ridging contributes to stenosis, below which there is possible additional small epidural abscess. C6-7:  Disc space narrowing, annular bulge. C7-T1:  Unremarkable. THORACIC SPINE FINDINGS Segmentation:  Standard Alignment:  Physiologic Vertebrae: No fracture. Subtle endplate hyperintensity superiorly at T12 associated with slight abnormal T11-T12 discal hyperintensity, concerning for early T12 osteomyelitis. These changes were not present previously. Cord: No abnormal cord signal. Paraspinal and other soft tissues: Dependent atelectasis versus consolidation RIGHT lower lobe. Disc levels: Disc spaces are unremarkable or display only mild spondylosis except for T11-12, mildly hyperintense, concerning for early discitis. IMPRESSION: MRI CERVICAL SPINE: Suspected C3-4 diskitis, C3 and C4 osteomyelitis, 12 x 5 x 22 mm C2-3 ventral epidural abscess, as well as extensive retropharyngeal phlegmon and abscess. Possible increased cord signal at C2 and C3, but no features to suggest posttraumatic cord contusion. Mild cord flattening at this level related to the adjacent epidural abscess. Multilevel spondylosis with areas of stenosis due to osseous ridging. Small ventral epidural collection at C5-6 not clearly compressive. MRI THORACIC SPINE: No thoracic epidural abscess. T11-T12 discal hyperintensity and abnormal edema in the T12 vertebral body, could represent early discitis and osteomyelitis. Dependent atelectasis versus consolidation RIGHT lower lobe. Chest radiograph recommended. A call has been placed to the ordering provider. Electronically Signed   By: Staci Righter M.D.   On: 07/27/2018 21:05   Mr Thoracic Spine W Wo Contrast  Result Date: 07/27/2018 CLINICAL DATA:  Quadriplegia for greater than 48 hours. No sensory response below the neck. Sepsis  with methicillin sensitive Staph aureus. Multiple falls prior to quadriplegia. Dementia. EXAM: MRI  CERVICAL AND THORACIC SPINE WITHOUT AND WITH CONTRAST TECHNIQUE: Multiplanar and multiecho pulse sequences of the cervical spine, to include the craniocervical junction and cervicothoracic junction, and thoracic spine, were obtained without and with intravenous contrast. CONTRAST:  Gadavist 10 mL. COMPARISON:  CT cervical spine 07/01/2018. MRI thoracic spine 07/01/2018. MRI lumbar spine 07/01/2018. MRA extracranial and intracranial 07/26/2018. FINDINGS: The patient was unable to remain motionless for the exam. Small or subtle lesions could be overlooked. Yo MRI CERVICAL SPINE FINDINGS Alignment: Anatomic Vertebrae: Congenital C2-C3 fusion/assimilation. Discal hyperintensity at C3-4 with subtle enhancement involving the C3 and C4 vertebral bodies suggesting osteomyelitis/discitis. Cord: The cord is contacted, and slightly compressed, by a ventral epidural abscess epicenter C2-C3, measuring 12 x 5 x 22 mm. The cord is equivocally hyperintense opposite C2 and C3; see sagittal STIR series 4, image 7. There may be a second small epidural abscess in the ventral epidural space opposite C6, with mild to moderate cord flattening related to spondylotic ridging at C5-C6 but no clear abnormality of cord signal. Posterior Fossa, vertebral arteries, paraspinal tissues: No tonsillar herniation. Prevertebral enhancement extends from the foramen magnum to C6 representing retropharyngeal phlegmon. Features of retropharyngeal abscess extend from C3 through C5. Disc levels: C2-3: Rudimentary. Stenosis with cord flattening secondary to epidural abscess. C3-4: Suspected discitis and regional osteomyelitis. Osseous ridging results in canal stenosis and cord flattening. Prevertebral retropharyngeal abscess. C4-5: Spondylotic ridging contributes to mild stenosis and foraminal narrowing. C5-6: Discal hyperintensity in the setting of severe loss  of disc height could represent additional disk infection. No visible adjacent osteomyelitis. Osseous ridging contributes to stenosis, below which there is possible additional small epidural abscess. C6-7:  Disc space narrowing, annular bulge. C7-T1:  Unremarkable. THORACIC SPINE FINDINGS Segmentation:  Standard Alignment:  Physiologic Vertebrae: No fracture. Subtle endplate hyperintensity superiorly at T12 associated with slight abnormal T11-T12 discal hyperintensity, concerning for early T12 osteomyelitis. These changes were not present previously. Cord: No abnormal cord signal. Paraspinal and other soft tissues: Dependent atelectasis versus consolidation RIGHT lower lobe. Disc levels: Disc spaces are unremarkable or display only mild spondylosis except for T11-12, mildly hyperintense, concerning for early discitis. IMPRESSION: MRI CERVICAL SPINE: Suspected C3-4 diskitis, C3 and C4 osteomyelitis, 12 x 5 x 22 mm C2-3 ventral epidural abscess, as well as extensive retropharyngeal phlegmon and abscess. Possible increased cord signal at C2 and C3, but no features to suggest posttraumatic cord contusion. Mild cord flattening at this level related to the adjacent epidural abscess. Multilevel spondylosis with areas of stenosis due to osseous ridging. Small ventral epidural collection at C5-6 not clearly compressive. MRI THORACIC SPINE: No thoracic epidural abscess. T11-T12 discal hyperintensity and abnormal edema in the T12 vertebral body, could represent early discitis and osteomyelitis. Dependent atelectasis versus consolidation RIGHT lower lobe. Chest radiograph recommended. A call has been placed to the ordering provider. Electronically Signed   By: Staci Righter M.D.   On: 07/27/2018 21:05   Dg Fluoro Guided Needle Plc Aspiration/injection Loc  Result Date: 07/27/2018 CLINICAL DATA:  Confusion.  Unable to move arms in legs. EXAM: DIAGNOSTIC LUMBAR PUNCTURE UNDER FLUOROSCOPIC GUIDANCE FLUOROSCOPY TIME:  Fluoroscopy  Time:  42 seconds Radiation Exposure Index (if provided by the fluoroscopic device): Is 16.2 mGy Number of Acquired Spot Images: 0 PROCEDURE: Informed consent was obtained from the patient prior to the procedure, including potential complications of headache, allergy, and pain. With the patient prone, the lower back was prepped with Betadine. 1% Lidocaine was used for local anesthesia. Lumbar puncture was performed initially  at the surgical level at L4-5 using a 20 gauge needle. There was slow return of blood tinged fluid. Opening pressure very low, approximately 12-13 cm of water. Very little fluid could be collected due to the very low pressures despite tilting the head up. Therefore, the needle was repositioned at a 2nd level at L3-4. Same blood tension fluid returned, with same low pressure. Very little fluid could be collected. A total of approximately 1 mL of blood-tinged cloudy fluid was collected. Fluid was sent for laboratory testing. The patient tolerated the procedure well and there were no apparent complications. IMPRESSION: Difficult lumbar puncture under fluoroscopic guidance due to very low pressures. Only a small amount of blood-tinged cloudy fluid could be collected despite tilting head significantly upward. Electronically Signed   By: Rolm Baptise M.D.   On: 07/27/2018 14:47        Scheduled Meds: . escitalopram  20 mg Oral Daily  . irbesartan  150 mg Oral Daily  . liver oil-zinc oxide   Topical QID  . pregabalin  75 mg Oral TID  . tamsulosin  0.8 mg Oral QHS   Continuous Infusions: . sodium chloride 50 mL/hr at 07/29/18 1106  . cefTRIAXone (ROCEPHIN)  IV 2 g (07/29/18 0908)     LOS: 7 days    Georgette Shell, MD [facility] If 7PM-7AM, please contact night-coverage [email or web address] Password [email or web address] 07/29/2018, 1:41 PM

## 2018-07-29 NOTE — Progress Notes (Signed)
[facility])   Received request from [employer], [place] for family interest in [facility] Chart reviewed and spoke with family to acknowledge referral.  Unfortunately [facility] is not able to offer a room today.  Family would also consider [facility] but they do prefer [facility] due to location.  [facility] Liaisons are aware of this and will follow up if they have availability.  Family and CSW are aware [facility] liaison will follow up with CSW and family tomorrow or sooner if room becomes available at either location. Please do not hesitate to call with questions.  Thank you,   Farrel Gordon, RN, [facility] Liaison  [facility] are on AMION

## 2018-07-30 DIAGNOSIS — Z7189 Other specified counseling: Secondary | ICD-10-CM

## 2018-07-30 DIAGNOSIS — Z515 Encounter for palliative care: Secondary | ICD-10-CM

## 2018-07-30 LAB — CULTURE, BLOOD (ROUTINE X 2)
Culture: NO GROWTH
Culture: NO GROWTH
Special Requests: ADEQUATE
Special Requests: ADEQUATE

## 2018-07-30 NOTE — Progress Notes (Signed)
Daily Progress Note   Patient Name: Ricky Black       Date: 07/30/2018 DOB: 1941-05-09  Age: 78 y.o. MRN#: [id number] Attending Physician: Tawni Millers Primary Care Physician: Marton Redwood, MD Admit Date: 07/22/2018  Reason for Consultation/Follow-up: Terminal Care  Subjective: Patient is some what awake, mumbles at times.  Daughters are at bedside  See below  Length of Stay: 8  Current Medications: Scheduled Meds:  . cetirizine HCl  5 mg Oral Daily  . escitalopram  20 mg Oral Daily  . fluticasone  1 spray Each Nare Daily  . irbesartan  150 mg Oral Daily  . liver oil-zinc oxide   Topical QID  . oxymetazoline  1 spray Each Nare BID  . pregabalin  75 mg Oral TID  . tamsulosin  0.8 mg Oral QHS    Continuous Infusions: . sodium chloride 50 mL/hr at 07/30/18 0717  . cefTRIAXone (ROCEPHIN)  IV 2 g (07/30/18 1050)    PRN Meds: acetaminophen, antiseptic oral rinse, glycopyrrolate **OR** glycopyrrolate **OR** glycopyrrolate, haloperidol **OR** haloperidol **OR** haloperidol lactate, HYDROcodone-acetaminophen, ketorolac, LORazepam, morphine injection, ondansetron **OR** ondansetron (ZOFRAN) IV, polyvinyl alcohol, senna-docusate  Physical Exam         Awake but not as alert In mild to moderate distress Has a fever Clear breath sounds S 1 S 2  No edema Extremities in heel pads, warm to touch, no coolness, no mottling noted Abdomen is not distended  Vital Signs: BP (!) 163/96 (BP Location: Left Arm)   Pulse 95   Temp (!) 100.5 F (38.1 C) (Axillary)   Resp 15   Ht 6\' 2"  (1.88 m)   Wt 107.5 kg   SpO2 93%   BMI 30.43 kg/m  SpO2: SpO2: 93 % O2 Device: O2 Device: Room Air O2 Flow Rate: O2 Flow Rate (L/min): 2 L/min  Intake/output summary:   Intake/Output  Summary (Last 24 hours) at 07/30/2018 1300 Last data filed at 07/30/2018 0900 Gross per 24 hour  Intake 521.48 ml  Output 925 ml  Net -403.52 ml   LBM: Last BM Date: 07/27/18 Baseline Weight: Weight: 107.5 kg Most recent weight: Weight: 107.5 kg       Palliative Assessment/Data: PPS 10%   Flowsheet Rows     Most Recent Value  Intake Tab  Referral Department  Hospitalist  Unit at Time of Referral  Med/Surg Unit  Palliative Care Primary Diagnosis  Sepsis/Infectious Disease  Date Notified  07/25/18  Palliative Care Type  New Palliative care  Reason for referral  Clarify Goals of Care  Date of Admission  07/22/18  Date first seen by Palliative Care  07/26/18  # of days Palliative referral response time  1 Day(s)  # of days IP prior to Palliative referral  3  Clinical Assessment  Psychosocial & Spiritual Assessment  Palliative Care Outcomes      Patient Active Problem List   Diagnosis Date Noted  . Urinary tract infection without hematuria   . Staphylococcus aureus bacteremia   . Quadriplegia ([place])   . Abscess in epidural space of cervical spine   . Cervical discitis   . Neck pain   . DNR (do not resuscitate)   . Palliative care encounter   . Sepsis ([place]) 07/22/2018  . Acute lower UTI 07/22/2018  . Transaminitis 07/22/2018  . Acute encephalopathy 07/02/2018  . Acute cystitis with hematuria 07/02/2018  . Back pain 07/01/2018  . Delirium 07/01/2018  . Hematuria, microscopic 07/01/2018  . Bradycardia   . Dizziness   . Orthostatic hypotension   . Spinal arthritis   . Neuropathy   . Hypogonadism male   . Dementia ([place])   . Abnormal EKG   . Essential hypertension 05/10/2007  . ARTHRITIS 05/10/2007    Palliative Care Assessment & Plan   Patient Profile:    Assessment:  sepsis Epidural abscess C3-C4 disc osteomyelitis MSSA bacteremia Acute metabolic encephalopathy Fever Generalized pain.    Recommendations/Plan:  Family meeting with patient's 2  daughters at bedside this am, reviewed current comfort measures. Await hospice bed availability. Several grand children, of various ages. We discussed about end of life signs and symptoms and anticipatory grief.   Morphine IV PRN to be used for comfort.   Will D/C Po medications not directly contributory to comfort.   Prognosis hours to some very limited number of days discussed frankly but compassionately with daughters.    Code Status:    Code Status Orders  (From admission, onward)         Start     Ordered   07/29/18 1410  Do not attempt resuscitation (DNR)  Continuous    Question Answer Comment  In the event of cardiac or respiratory ARREST Do not call a "code blue"   In the event of cardiac or respiratory ARREST Do not perform Intubation, CPR, defibrillation or ACLS   In the event of cardiac or respiratory ARREST Use medication by any route, position, wound care, and other measures to relive pain and suffering. May use oxygen, suction and manual treatment of airway obstruction as needed for comfort.      07/29/18 1412        Code Status History    Date Active Date Inactive Code Status Order ID Comments User Context   07/26/2018 2353 07/29/2018 1412 DNR [id number]  Fuller Canada, PA-C Inpatient   07/22/2018 1525 07/26/2018 1245 Full Code [id number]  Edwin Dada, MD ED   07/02/2018 0448 07/04/2018 2323 Full Code [id number]  Rise Patience, MD Inpatient   07/01/2018 0106 07/01/2018 1946 Full Code [id number]  [name], DO ED       Prognosis:   < 2 weeks  Discharge Planning:  Hospice facility  Care plan was discussed with  Patient, RN, 2 dtrs at bedside   Thank you for allowing the Palliative  Medicine Team to assist in the care of this patient.   Time In: 8.30 Time Out: 9.05 Total Time 35 Prolonged Time Billed  no       Greater than 50%  of this time was spent counseling and coordinating care related to the above assessment and plan.  Loistine Chance, MD [phone] Please contact Palliative Medicine Team phone at [phone] for questions and concerns.

## 2018-07-30 NOTE — Progress Notes (Signed)
[id number] -- Hospice and [place] [facility]) [place] RN Visit  Unfortunately, [facility] is not able to offer a room today.  Family and Ria Comment, [place] notified and are aware [facility] Liaison will follow up with CSW and family tomorrow or sooner if room becomes available.  Please do not hesitate to call with questions.  Thank you, Margaretmary Eddy, RN, [facility] Liaison [phone]  [place] are on [facility]ION.

## 2018-07-30 NOTE — Progress Notes (Signed)
PROGRESS NOTE    Ricky Black  YDX:[id number] DOB: [birth year] DOA: 07/22/2018 PCP: Marton Redwood, MD    Brief Narrative:  78 year old male who presented with confusion and weakness.  He does have history of dementia, hypertension and recurrent urinary tract infections.  Was noted to have progressive weakness over the last 2 weeks, to the point where he was unable to get out of bed, on the day of admission he was less responsive and febrile.  On his initial physical examination his temperature was 101 F, heart rate 91, respiratory rate 31, blood pressure 130/79, his lungs were clear to auscultation bilaterally, heart S1-S2 present and rhythmic, abdomen was soft, nontender, no lower extremity edema.  He was not able to follow commands or make eye contact.  He was admitted to the hospital working diagnosis of sepsis due to urine tract infection complicated by acute encephalopathy.  Assessment & Plan:   Principal Problem:   Sepsis ([place]) Active Problems:   Essential hypertension   Dementia ([place])   Acute encephalopathy   Acute lower UTI   Transaminitis   Neck pain   DNR (do not resuscitate)   Palliative care encounter   Urinary tract infection without hematuria   Staphylococcus aureus bacteremia   Quadriplegia ([facility])   Abscess in epidural space of cervical spine   Cervical discitis   1. Epidural abscess C3-C4 osteomyelytis/ discitis. Patient with very poor prognosis, currently under palliative care and pending placement at hospice. No signs of distress on physical examination. Will continue gentle hydration with hypotonic saline, discontinue IV antibiotic therapy (ceftriaxone).   2. MSSA bacteremia. Discontinue antibiotic therapy with IV Ceftriaxone, patient with poor prognosis and patient's family have decided for comfort meassures.   3. Metabolic encephalopathy. Aspiration precautions, as needed morphine, hydrocodone, haldol and lorazepam.    DVT prophylaxis: scd  Code Status:   dnr  Family Communication: I spoke with patient's family at the bedside and all questions were addressed.   Disposition Plan/ discharge barriers: Pending placement at hospice.   Body mass index is 30.43 kg/m. Malnutrition Type:      Malnutrition Characteristics:      Nutrition Interventions:     RN Pressure Injury Documentation:     Consultants:   Palliative care  ID  Procedures:     Antimicrobials:    Subjective: Patient has been somnolent by the time of my examination, all information per his family at the bedside. His pain has been well controlled with IV analgesics, he was noted to have difficulty with po analgesics, (coughing).   Objective: Vitals:   07/29/18 1354 07/29/18 2124 07/30/18 0532 07/30/18 0820  BP: 123/72 (!) 155/101 (!) 163/96   Pulse: 91 94 95   Resp: 18 16 15    Temp: 99.1 F (37.3 C) 99.1 F (37.3 C) (!) 101.2 F (38.4 C) (!) 100.5 F (38.1 C)  TempSrc: Oral Oral Axillary Axillary  SpO2: 94% 91% 93%   Weight:      Height:        Intake/Output Summary (Last 24 hours) at 07/30/2018 1147 Last data filed at 07/30/2018 0900 Gross per 24 hour  Intake 521.48 ml  Output 925 ml  Net -403.52 ml   Filed Weights   07/22/18 1232  Weight: 107.5 kg    Examination:   General: deconditioned and ill looking appearing  Neurology: poorly responsive, eyes closed, not answering questions, or following commands.  E ENT: mild pallor, no icterus, oral mucosa moist Cardiovascular: No JVD. S1-S2 present, rhythmic,  no gallops, rubs, or murmurs. No lower extremity edema. Pulmonary: decreased breath sounds bilaterally, adequate air movement, no wheezing, rhonchi or rales. Gastrointestinal. Abdomen with no organomegaly, non tender, no rebound or guarding Skin. No rashes Musculoskeletal: no joint deformities     Data Reviewed: I have personally reviewed following labs and imaging studies  CBC: Recent Labs  Lab 07/26/18 0533 07/26/18 0633  07/27/18 1009  WBC 13.9* 14.4* 12.6*  HGB 11.4* 11.0* 10.6*  HCT 37.4* 36.1* 35.2*  MCV 107.8* 108.4* 110.0*  PLT 233 220 193   Basic Metabolic Panel: Recent Labs  Lab 07/26/18 0533 07/26/18 0633 07/27/18 1009  NA 148* 147* 145  K 3.7 3.6 3.6  CL 115* 114* 113*  CO2 25 25 24   GLUCOSE 153* 163* 221*  BUN 26* 27* 40*  CREATININE 0.69 0.72 0.75  CALCIUM 8.4* 8.4* 8.1*   GFR: Estimated Creatinine Clearance: 97.7 mL/min (by C-G formula based on SCr of 0.75 mg/dL). Liver Function Tests: Recent Labs  Lab 07/26/18 0533 07/27/18 1009  AST 72* 66*  ALT 87* 61*  ALKPHOS 95 78  BILITOT 0.9 0.6  PROT 5.6* 5.5*  ALBUMIN 1.7* 1.8*   No results for input(s): LIPASE, AMYLASE in the last 168 hours. No results for input(s): AMMONIA in the last 168 hours. Coagulation Profile: No results for input(s): INR, PROTIME in the last 168 hours. Cardiac Enzymes: Recent Labs  Lab 07/26/18 0533  CKTOTAL 48*   BNP (last 3 results) No results for input(s): PROBNP in the last 8760 hours. HbA1C: No results for input(s): HGBA1C in the last 72 hours. CBG: No results for input(s): GLUCAP in the last 168 hours. Lipid Profile: No results for input(s): CHOL, HDL, LDLCALC, TRIG, CHOLHDL, LDLDIRECT in the last 72 hours. Thyroid Function Tests: No results for input(s): TSH, T4TOTAL, FREET4, T3FREE, THYROIDAB in the last 72 hours. Anemia Panel: No results for input(s): VITAMINB12, FOLATE, FERRITIN, TIBC, IRON, RETICCTPCT in the last 72 hours.    Radiology Studies: I have reviewed all of the imaging during this hospital visit personally     Scheduled Meds: . cetirizine HCl  5 mg Oral Daily  . escitalopram  20 mg Oral Daily  . fluticasone  1 spray Each Nare Daily  . irbesartan  150 mg Oral Daily  . liver oil-zinc oxide   Topical QID  . oxymetazoline  1 spray Each Nare BID  . pregabalin  75 mg Oral TID  . tamsulosin  0.8 mg Oral QHS   Continuous Infusions: . sodium chloride 50 mL/hr at  07/30/18 0717  . cefTRIAXone (ROCEPHIN)  IV 2 g (07/30/18 1050)     LOS: 8 days        Mauricio Gerome Apley, MD [facility] Pager [phone]

## 2018-07-31 DIAGNOSIS — F0391 Unspecified dementia with behavioral disturbance: Secondary | ICD-10-CM

## 2018-07-31 LAB — CSF CULTURE W GRAM STAIN
Culture: NO GROWTH
Gram Stain: NONE SEEN

## 2018-07-31 LAB — CSF CULTURE

## 2018-07-31 NOTE — Progress Notes (Addendum)
Patient discharging to [facility]. Confirmed bed & faxed required docs. Wife, Inez Catalina, aware of d/c plan and transport. [facility] will be used for transport.  RN call report: [phone].  Pricilla Holm, MSW, [place] [facility] [phone]

## 2018-07-31 NOTE — Plan of Care (Signed)
Patient is on comfort care.

## 2018-07-31 NOTE — Progress Notes (Signed)
Report called to Clarene Critchley at [facility].

## 2018-07-31 NOTE — Progress Notes (Signed)
  Name: Ricky Black Location: [place]  Petra [place] of Visit: End of Life.   Chaplain responded to a page to provide emotional support via prayer.   Daughters, wife, brother and friends were at beside. Chaplain available by pager if family is in need for more support.

## 2018-07-31 NOTE — Discharge Summary (Addendum)
Physician Discharge Summary  Ricky Black:[id number] DOB: 01-31-1941 DOA: 07/22/2018  PCP: Marton Redwood, MD  Admit date: 07/22/2018 Discharge date: 07/31/2018  Admitted From: Home  Disposition:  Hospice   Recommendations for Outpatient Follow-up and new medication changes:  1. Patient on comfort measures, palliative care.   Home Health: na  Equipment/Devices: na   Discharge Condition: stable  CODE STATUS: DNR   Diet recommendation: As tolerated, palliative care.   Brief/Interim Summary: 78 year old male who presented with confusion and weakness. He does have significant medical history of dementia, hypertension and recurrent urinary tract infections. Patinetwas noted to have progressive weakness over the last 2 weeks, to the point where he was unable to get out of bed, on the day of admission he was less responsive and febrile. On his initial physical examination his temperature was 101 F, heart rate 91, respiratory rate 31, blood pressure 130/79,his lungs were clear to auscultation bilaterally, heart S1-S2 present and rhythmic, abdomen was soft, nontender, no lower extremity edema. He was not able to follow commands or make eye contact.  Sodium 139, potassium 4.0, chloride 100, bicarb 24, glucose 190, BUN 46, creatinine 0.94, AST 63, ALT 63, white count 13.3, hemoglobin 13.0, hematocrit 40.3, platelets 201.  Chest radiograph with bibasilar atelectasis, CT of the head, and renal stone study with no acute changes.  Urinalysis with greater than 50 red cells and greater than 50 white cells  He was admitted to the hospital working diagnosis of sepsis due to urine tract infection complicated by acute encephalopathy.  Further work up with cervical spine MRI showed discitis at C3-C4 with osteomyelitis and C2-C3 ventral epidural abscess with extensive retropharyngeal phlegmon and abscess.  Thoracic spine with no epidural abscess.  T11, T12 discal hyper intensity and abnormal edema suggesting  early discitis and osteomyelitis.   1.  Epidural abscess C3/C4, osteomyelitis/discitis/ due to MSSA, complicated with metabolic encephalitis and sepsis. (present on admission). Patient was admitted to the medical ward, he was placed on broad-spectrum antibiotics, blood cultures and urine culture grew MSSA, lumbar puncture showed more than 600 protein, unable to perform cell count, hazy fluid. He was placed on IV cefazolin, transthoracic echocardiography showed normal LV systolic function with no evidence of intra-cardiac or valvular vegetations.  Patient remained with clinical quadriplegia, and poorly responsive.  Patient was seen by neurosurgery, determined to be very poor prognosis and poor surgical candidate.  Palliative care services were consulted and patient was placed on comfort measures.  Currently patient will be discharged to hospice.  Antibiotic therapy has been discontinued.  2.  Hypertension.  Hypertensive agents were held due to acute illness.  3.  Dementia.  Patient remained encephalopathic likely exacerbated by systemic infection.  4.  MSSA urinary tract infection, present on admission.  Likely related to MSSA bacteremia.     Discharge Diagnoses:  Principal Problem:   Sepsis ([place]) Active Problems:   Essential hypertension   Dementia ([place])   Acute encephalopathy   Acute lower UTI   Transaminitis   Neck pain   DNR (do not resuscitate)   Palliative care encounter   Urinary tract infection without hematuria   Staphylococcus aureus bacteremia   Quadriplegia ([place])   Abscess in epidural space of cervical spine   Cervical discitis   Goals of care, counseling/discussion   Dying care    Discharge Instructions  Discharge Instructions    Diet - low sodium heart healthy   Complete by:  As directed    Discharge instructions  Complete by:  As directed    Follow with hospice instructions.   Increase activity slowly   Complete by:  As directed      Allergies as of  07/31/2018   No Known Allergies     Medication List    STOP taking these medications   bethanechol 25 MG tablet Commonly known as:  URECHOLINE   celecoxib 200 MG capsule Commonly known as:  CELEBREX   cyclobenzaprine 5 MG tablet Commonly known as:  FLEXERIL   donepezil 10 MG tablet Commonly known as:  ARICEPT   escitalopram 20 MG tablet Commonly known as:  LEXAPRO   finasteride 5 MG tablet Commonly known as:  PROSCAR   hydrochlorothiazide 25 MG tablet Commonly known as:  HYDRODIURIL   HYDROcodone-acetaminophen 7.5-325 MG tablet Commonly known as:  NORCO   LORazepam 0.5 MG tablet Commonly known as:  ATIVAN   LYRICA 75 MG capsule Generic drug:  pregabalin   Melatonin 3 MG Tabs   NAMENDA 10 MG tablet Generic drug:  memantine   potassium chloride 10 MEQ tablet Commonly known as:  K-DUR   tamsulosin 0.4 MG Caps capsule Commonly known as:  FLOMAX   valsartan 160 MG tablet Commonly known as:  DIOVAN       No Known Allergies  Consultations:  Palliative care  ID  Neurosurgery   Procedures/Studies: Dg Neck Soft Tissue  Result Date: 07/26/2018 CLINICAL DATA:  Severe left sided neck pain worsening over last two days; limited range of motion EXAM: NECK SOFT TISSUES - 1+ VIEW COMPARISON:  Neck CT 07/01/2018 FINDINGS: Multilevel disc osteophytic disease with facet hypertrophy. Patient's head is tilted rightward. No acute findings in the cervical spine identified. No prevertebral soft tissue thickening. No abnormality in the oropharynx or hypopharynx identified. IMPRESSION: No acute findings identified by plain film radiography. Electronically Signed   By: Suzy Bouchard M.D.   On: 07/26/2018 14:17   Dg Chest 1 View  Result Date: 07/24/2018 CLINICAL DATA:  Shortness of breath and weakness. EXAM: CHEST  1 VIEW COMPARISON:  07/22/2018 FINDINGS: Lungs are hypoinflated with mild bibasilar opacification which may be due to atelectasis or infection. Cardiomediastinal  silhouette and remainder the exam is unchanged. IMPRESSION: Bibasilar opacification which may be due to atelectasis or infection. Electronically Signed   By: Marin Olp M.D.   On: 07/24/2018 12:34   Dg Chest 1 View  Result Date: 07/01/2018 CLINICAL DATA:  Fall. EXAM: CHEST  1 VIEW COMPARISON:  Chest radiograph August 07, 2005 FINDINGS: The cardiac silhouette is upper limits of normal in size. Mediastinal silhouette is not suspicious. No pleural effusion or focal consolidation. No pneumothorax. Soft tissue planes and included osseous structures are nonacute. IMPRESSION: Borderline cardiomegaly, no acute pulmonary process. Electronically Signed   By: Elon Alas M.D.   On: 07/01/2018 23:05   Ct Head Wo Contrast  Result Date: 07/22/2018 CLINICAL DATA:  Altered level of consciousness, sepsis EXAM: CT HEAD WITHOUT CONTRAST TECHNIQUE: Contiguous axial images were obtained from the base of the skull through the vertex without intravenous contrast. COMPARISON:  07/01/2018 FINDINGS: Brain: Similar atrophy pattern and chronic white matter microvascular ischemic changes throughout both cerebral hemispheres. No acute intracranial hemorrhage, mass lesion, new infarction, midline shift, herniation, hydrocephalus, or extra-axial fluid collection. No focal mass effect or edema. Cisterns are patent. Minor cerebellar atrophy as well. Vascular: No hyperdense vessel or unexpected calcification. Skull: Normal. Negative for fracture or focal lesion. Sinuses/Orbits: No acute finding. Other: None. IMPRESSION: Stable atrophy and chronic white  matter microvascular ischemic changes. No acute intracranial abnormality by noncontrast CT. Electronically Signed   By: Jerilynn Mages.  Shick M.D.   On: 07/22/2018 13:47   Ct Head Wo Contrast  Result Date: 07/01/2018 CLINICAL DATA:  Altered mental status S. Fall. Urinary tract infection. Confusion. EXAM: CT HEAD WITHOUT CONTRAST CT CERVICAL SPINE WITHOUT CONTRAST TECHNIQUE: Multidetector CT  imaging of the head and cervical spine was performed following the standard protocol without intravenous contrast. Multiplanar CT image reconstructions of the cervical spine were also generated. COMPARISON:  06/30/2018 FINDINGS: Despite efforts by the technologist and patient, motion artifact is present on today's exam and could not be eliminated. This reduces exam sensitivity and specificity. CT HEAD FINDINGS Brain: The brainstem, cerebellum, cerebral peduncles, thalami, basal ganglia, basilar cisterns, and ventricular system appear within normal limits. No intracranial hemorrhage, mass lesion, or acute CVA. Overall no significant intracranial abnormality. Vascular: Unremarkable Skull: Unremarkable Sinuses/Orbits: Chronic right maxillary and ethmoid sinusitis. Other: No supplemental non-categorized findings. CT CERVICAL SPINE FINDINGS Alignment: No vertebral subluxation is observed. Skull base and vertebrae: Reduced sensitivity due to motion, no discrete fracture is identified. Fused C2 and C3 vertebra. Soft tissues and spinal canal: Bilateral common carotid atherosclerotic calcification. Disc levels: Foraminal impingement at all cervical levels due to uncinate spurring and facet arthropathy. Upper chest: Unremarkable Other: Served skip other IMPRESSION: 1. No acute intracranial findings or acute cervical spine findings. 2. Chronic right maxillary and ethmoid sinusitis. 3. Reduced sensitivity due to motion artifact. 4. Foraminal impingement at all cervical levels due to uncinate spurring and facet arthropathy. 5. Congenital fusion at C2-3. Electronically Signed   By: Van Clines M.D.   On: 07/01/2018 23:27   Ct Cervical Spine Wo Contrast  Result Date: 07/01/2018 CLINICAL DATA:  Altered mental status S. Fall. Urinary tract infection. Confusion. EXAM: CT HEAD WITHOUT CONTRAST CT CERVICAL SPINE WITHOUT CONTRAST TECHNIQUE: Multidetector CT imaging of the head and cervical spine was performed following the  standard protocol without intravenous contrast. Multiplanar CT image reconstructions of the cervical spine were also generated. COMPARISON:  06/30/2018 FINDINGS: Despite efforts by the technologist and patient, motion artifact is present on today's exam and could not be eliminated. This reduces exam sensitivity and specificity. CT HEAD FINDINGS Brain: The brainstem, cerebellum, cerebral peduncles, thalami, basal ganglia, basilar cisterns, and ventricular system appear within normal limits. No intracranial hemorrhage, mass lesion, or acute CVA. Overall no significant intracranial abnormality. Vascular: Unremarkable Skull: Unremarkable Sinuses/Orbits: Chronic right maxillary and ethmoid sinusitis. Other: No supplemental non-categorized findings. CT CERVICAL SPINE FINDINGS Alignment: No vertebral subluxation is observed. Skull base and vertebrae: Reduced sensitivity due to motion, no discrete fracture is identified. Fused C2 and C3 vertebra. Soft tissues and spinal canal: Bilateral common carotid atherosclerotic calcification. Disc levels: Foraminal impingement at all cervical levels due to uncinate spurring and facet arthropathy. Upper chest: Unremarkable Other: Served skip other IMPRESSION: 1. No acute intracranial findings or acute cervical spine findings. 2. Chronic right maxillary and ethmoid sinusitis. 3. Reduced sensitivity due to motion artifact. 4. Foraminal impingement at all cervical levels due to uncinate spurring and facet arthropathy. 5. Congenital fusion at C2-3. Electronically Signed   By: Van Clines M.D.   On: 07/01/2018 23:27   Mr Jodene Nam Neck W Wo Contrast  Result Date: 07/26/2018 CLINICAL DATA:  Initial evaluation for acute unexplained altered mental status, slowly progressive weakness, neck stiffness. EXAM: MRI HEAD WITHOUT AND WITH CONTRAST MRA NECK WITHOUT AND WITH CONTRAST TECHNIQUE: Multiplanar, multiecho pulse sequences of the brain and surrounding structures  were obtained without and  with intravenous contrast. Angiographic images of the neck were obtained using MRA technique without and with intravenous contrast. Carotid stenosis measurements (when applicable) are obtained utilizing NASCET criteria, using the distal internal carotid diameter as the denominator. CONTRAST:  10 cc of Gadavist. COMPARISON:  None available. FINDINGS: MRI HEAD FINDINGS Diffuse prominence of the CSF containing spaces compatible generalized age-related cerebral atrophy. Mild chronic microvascular changes present within the periventricular white matter. No abnormal foci of restricted diffusion to suggest acute or subacute ischemia. Gray-white matter differentiation maintained. No encephalomalacia to suggest chronic infarction. No foci of susceptibility artifact to suggest acute or chronic intracranial hemorrhage. No mass lesion, midline shift or mass effect. No hydrocephalus. No extra-axial fluid collection. Pituitary gland within normal limits. Midline structures intact and normal. No abnormal enhancement. No findings to suggest meningitis or intracranial infection. Hand healed and hand Major intracranial vascular flow voids are well maintained. Craniocervical junction within normal limits. Degenerative spondylolysis at C3-4 with resultant at least moderate spinal stenosis (series 5, image 13). Finding incompletely evaluated on this exam. Bone marrow signal intensity within normal limits. No scalp soft tissue abnormality. Patient status post left-sided ocular lens replacement. Globes and orbital soft tissues demonstrate no acute finding. Mild mucosal thickening within the ethmoidal air cells and maxillary sinuses. Paranasal sinuses are otherwise clear. Trace right mastoid effusion noted, of doubtful significance. MRA NECK FINDINGS Source time-of-flight imaging demonstrates patent antegrade flow within both carotid and vertebral arteries bilaterally. No evidence for arterial dissection on axial T1 fat-sat imaging.  Visualized aortic arch of normal caliber with normal branch pattern. No hemodynamically significant stenosis about the origin of the great vessels. Visualized subclavian arteries widely patent. Right common carotid artery patent from its origin to the bifurcation without stenosis. Atheromatous irregularity at the proximal right ICA with relatively mild 20-25% stenosis by NASCET criteria. Right ICA widely patent distally to the skull base. Left common carotid artery patent from its origin to the bifurcation without stenosis. Short-segment mild approximate 25% stenosis at the origin of the left ICA. Left ICA otherwise widely patent to the skull base. Both of the vertebral arteries arise from the subclavian arteries. Short-segment approximate 60% stenosis at the origin of the right vertebral artery. Relatively mild no more than 20% narrowing at the origin of left vertebral artery. Vertebral arteries otherwise widely patent within the neck without stenosis or occlusion. IMPRESSION: MRI HEAD IMPRESSION: 1. No acute intracranial abnormality. No evidence for intracranial infection. 2. Mild age-related cerebral atrophy with chronic small vessel ischemic disease. MRA NECK IMPRESSION: 1. Negative MRA with no evidence for acute abnormality about the major arterial vasculature of the neck. 2. Atheromatous stenoses about the proximal ICAs bilaterally with associated narrowing of up to approximately 20-25% by NASCET criteria. 3. Short-segment approximate 60% atheromatous stenosis at the origin of the right vertebral artery. Relatively mild no more than 25% narrowing at the origin of the left vertebral artery. Vertebral arteries otherwise widely patent within the neck. Electronically Signed   By: Jeannine Boga M.D.   On: 07/26/2018 23:06   Mr Jeri Cos VP Contrast  Result Date: 07/26/2018 CLINICAL DATA:  Initial evaluation for acute unexplained altered mental status, slowly progressive weakness, neck stiffness. EXAM: MRI  HEAD WITHOUT AND WITH CONTRAST MRA NECK WITHOUT AND WITH CONTRAST TECHNIQUE: Multiplanar, multiecho pulse sequences of the brain and surrounding structures were obtained without and with intravenous contrast. Angiographic images of the neck were obtained using MRA technique without and with intravenous contrast. Carotid  stenosis measurements (when applicable) are obtained utilizing NASCET criteria, using the distal internal carotid diameter as the denominator. CONTRAST:  10 cc of Gadavist. COMPARISON:  None available. FINDINGS: MRI HEAD FINDINGS Diffuse prominence of the CSF containing spaces compatible generalized age-related cerebral atrophy. Mild chronic microvascular changes present within the periventricular white matter. No abnormal foci of restricted diffusion to suggest acute or subacute ischemia. Gray-white matter differentiation maintained. No encephalomalacia to suggest chronic infarction. No foci of susceptibility artifact to suggest acute or chronic intracranial hemorrhage. No mass lesion, midline shift or mass effect. No hydrocephalus. No extra-axial fluid collection. Pituitary gland within normal limits. Midline structures intact and normal. No abnormal enhancement. No findings to suggest meningitis or intracranial infection. Hand healed and hand Major intracranial vascular flow voids are well maintained. Craniocervical junction within normal limits. Degenerative spondylolysis at C3-4 with resultant at least moderate spinal stenosis (series 5, image 13). Finding incompletely evaluated on this exam. Bone marrow signal intensity within normal limits. No scalp soft tissue abnormality. Patient status post left-sided ocular lens replacement. Globes and orbital soft tissues demonstrate no acute finding. Mild mucosal thickening within the ethmoidal air cells and maxillary sinuses. Paranasal sinuses are otherwise clear. Trace right mastoid effusion noted, of doubtful significance. MRA NECK FINDINGS Source  time-of-flight imaging demonstrates patent antegrade flow within both carotid and vertebral arteries bilaterally. No evidence for arterial dissection on axial T1 fat-sat imaging. Visualized aortic arch of normal caliber with normal branch pattern. No hemodynamically significant stenosis about the origin of the great vessels. Visualized subclavian arteries widely patent. Right common carotid artery patent from its origin to the bifurcation without stenosis. Atheromatous irregularity at the proximal right ICA with relatively mild 20-25% stenosis by NASCET criteria. Right ICA widely patent distally to the skull base. Left common carotid artery patent from its origin to the bifurcation without stenosis. Short-segment mild approximate 25% stenosis at the origin of the left ICA. Left ICA otherwise widely patent to the skull base. Both of the vertebral arteries arise from the subclavian arteries. Short-segment approximate 60% stenosis at the origin of the right vertebral artery. Relatively mild no more than 20% narrowing at the origin of left vertebral artery. Vertebral arteries otherwise widely patent within the neck without stenosis or occlusion. IMPRESSION: MRI HEAD IMPRESSION: 1. No acute intracranial abnormality. No evidence for intracranial infection. 2. Mild age-related cerebral atrophy with chronic small vessel ischemic disease. MRA NECK IMPRESSION: 1. Negative MRA with no evidence for acute abnormality about the major arterial vasculature of the neck. 2. Atheromatous stenoses about the proximal ICAs bilaterally with associated narrowing of up to approximately 20-25% by NASCET criteria. 3. Short-segment approximate 60% atheromatous stenosis at the origin of the right vertebral artery. Relatively mild no more than 25% narrowing at the origin of the left vertebral artery. Vertebral arteries otherwise widely patent within the neck. Electronically Signed   By: Jeannine Boga M.D.   On: 07/26/2018 23:06   Mr  Cervical Spine W Wo Contrast  Result Date: 07/27/2018 CLINICAL DATA:  Quadriplegia for greater than 48 hours. No sensory response below the neck. Sepsis with methicillin sensitive Staph aureus. Multiple falls prior to quadriplegia. Dementia. EXAM: MRI CERVICAL AND THORACIC SPINE WITHOUT AND WITH CONTRAST TECHNIQUE: Multiplanar and multiecho pulse sequences of the cervical spine, to include the craniocervical junction and cervicothoracic junction, and thoracic spine, were obtained without and with intravenous contrast. CONTRAST:  Gadavist 10 mL. COMPARISON:  CT cervical spine 07/01/2018. MRI thoracic spine 07/01/2018. MRI lumbar spine 07/01/2018. MRA extracranial  and intracranial 07/26/2018. FINDINGS: The patient was unable to remain motionless for the exam. Small or subtle lesions could be overlooked. Yo MRI CERVICAL SPINE FINDINGS Alignment: Anatomic Vertebrae: Congenital C2-C3 fusion/assimilation. Discal hyperintensity at C3-4 with subtle enhancement involving the C3 and C4 vertebral bodies suggesting osteomyelitis/discitis. Cord: The cord is contacted, and slightly compressed, by a ventral epidural abscess epicenter C2-C3, measuring 12 x 5 x 22 mm. The cord is equivocally hyperintense opposite C2 and C3; see sagittal STIR series 4, image 7. There may be a second small epidural abscess in the ventral epidural space opposite C6, with mild to moderate cord flattening related to spondylotic ridging at C5-C6 but no clear abnormality of cord signal. Posterior Fossa, vertebral arteries, paraspinal tissues: No tonsillar herniation. Prevertebral enhancement extends from the foramen magnum to C6 representing retropharyngeal phlegmon. Features of retropharyngeal abscess extend from C3 through C5. Disc levels: C2-3: Rudimentary. Stenosis with cord flattening secondary to epidural abscess. C3-4: Suspected discitis and regional osteomyelitis. Osseous ridging results in canal stenosis and cord flattening. Prevertebral  retropharyngeal abscess. C4-5: Spondylotic ridging contributes to mild stenosis and foraminal narrowing. C5-6: Discal hyperintensity in the setting of severe loss of disc height could represent additional disk infection. No visible adjacent osteomyelitis. Osseous ridging contributes to stenosis, below which there is possible additional small epidural abscess. C6-7:  Disc space narrowing, annular bulge. C7-T1:  Unremarkable. THORACIC SPINE FINDINGS Segmentation:  Standard Alignment:  Physiologic Vertebrae: No fracture. Subtle endplate hyperintensity superiorly at T12 associated with slight abnormal T11-T12 discal hyperintensity, concerning for early T12 osteomyelitis. These changes were not present previously. Cord: No abnormal cord signal. Paraspinal and other soft tissues: Dependent atelectasis versus consolidation RIGHT lower lobe. Disc levels: Disc spaces are unremarkable or display only mild spondylosis except for T11-12, mildly hyperintense, concerning for early discitis. IMPRESSION: MRI CERVICAL SPINE: Suspected C3-4 diskitis, C3 and C4 osteomyelitis, 12 x 5 x 22 mm C2-3 ventral epidural abscess, as well as extensive retropharyngeal phlegmon and abscess. Possible increased cord signal at C2 and C3, but no features to suggest posttraumatic cord contusion. Mild cord flattening at this level related to the adjacent epidural abscess. Multilevel spondylosis with areas of stenosis due to osseous ridging. Small ventral epidural collection at C5-6 not clearly compressive. MRI THORACIC SPINE: No thoracic epidural abscess. T11-T12 discal hyperintensity and abnormal edema in the T12 vertebral body, could represent early discitis and osteomyelitis. Dependent atelectasis versus consolidation RIGHT lower lobe. Chest radiograph recommended. A call has been placed to the ordering provider. Electronically Signed   By: Staci Righter M.D.   On: 07/27/2018 21:05   Mr Thoracic Spine W Wo Contrast  Result Date:  07/27/2018 CLINICAL DATA:  Quadriplegia for greater than 48 hours. No sensory response below the neck. Sepsis with methicillin sensitive Staph aureus. Multiple falls prior to quadriplegia. Dementia. EXAM: MRI CERVICAL AND THORACIC SPINE WITHOUT AND WITH CONTRAST TECHNIQUE: Multiplanar and multiecho pulse sequences of the cervical spine, to include the craniocervical junction and cervicothoracic junction, and thoracic spine, were obtained without and with intravenous contrast. CONTRAST:  Gadavist 10 mL. COMPARISON:  CT cervical spine 07/01/2018. MRI thoracic spine 07/01/2018. MRI lumbar spine 07/01/2018. MRA extracranial and intracranial 07/26/2018. FINDINGS: The patient was unable to remain motionless for the exam. Small or subtle lesions could be overlooked. Yo MRI CERVICAL SPINE FINDINGS Alignment: Anatomic Vertebrae: Congenital C2-C3 fusion/assimilation. Discal hyperintensity at C3-4 with subtle enhancement involving the C3 and C4 vertebral bodies suggesting osteomyelitis/discitis. Cord: The cord is contacted, and slightly compressed, by a ventral  epidural abscess epicenter C2-C3, measuring 12 x 5 x 22 mm. The cord is equivocally hyperintense opposite C2 and C3; see sagittal STIR series 4, image 7. There may be a second small epidural abscess in the ventral epidural space opposite C6, with mild to moderate cord flattening related to spondylotic ridging at C5-C6 but no clear abnormality of cord signal. Posterior Fossa, vertebral arteries, paraspinal tissues: No tonsillar herniation. Prevertebral enhancement extends from the foramen magnum to C6 representing retropharyngeal phlegmon. Features of retropharyngeal abscess extend from C3 through C5. Disc levels: C2-3: Rudimentary. Stenosis with cord flattening secondary to epidural abscess. C3-4: Suspected discitis and regional osteomyelitis. Osseous ridging results in canal stenosis and cord flattening. Prevertebral retropharyngeal abscess. C4-5: Spondylotic ridging  contributes to mild stenosis and foraminal narrowing. C5-6: Discal hyperintensity in the setting of severe loss of disc height could represent additional disk infection. No visible adjacent osteomyelitis. Osseous ridging contributes to stenosis, below which there is possible additional small epidural abscess. C6-7:  Disc space narrowing, annular bulge. C7-T1:  Unremarkable. THORACIC SPINE FINDINGS Segmentation:  Standard Alignment:  Physiologic Vertebrae: No fracture. Subtle endplate hyperintensity superiorly at T12 associated with slight abnormal T11-T12 discal hyperintensity, concerning for early T12 osteomyelitis. These changes were not present previously. Cord: No abnormal cord signal. Paraspinal and other soft tissues: Dependent atelectasis versus consolidation RIGHT lower lobe. Disc levels: Disc spaces are unremarkable or display only mild spondylosis except for T11-12, mildly hyperintense, concerning for early discitis. IMPRESSION: MRI CERVICAL SPINE: Suspected C3-4 diskitis, C3 and C4 osteomyelitis, 12 x 5 x 22 mm C2-3 ventral epidural abscess, as well as extensive retropharyngeal phlegmon and abscess. Possible increased cord signal at C2 and C3, but no features to suggest posttraumatic cord contusion. Mild cord flattening at this level related to the adjacent epidural abscess. Multilevel spondylosis with areas of stenosis due to osseous ridging. Small ventral epidural collection at C5-6 not clearly compressive. MRI THORACIC SPINE: No thoracic epidural abscess. T11-T12 discal hyperintensity and abnormal edema in the T12 vertebral body, could represent early discitis and osteomyelitis. Dependent atelectasis versus consolidation RIGHT lower lobe. Chest radiograph recommended. A call has been placed to the ordering provider. Electronically Signed   By: Staci Righter M.D.   On: 07/27/2018 21:05   US Abdomen Complete  Result Date: 07/23/2018 CLINICAL DATA:  Elevated liver function tests. EXAM: ABDOMEN  ULTRASOUND COMPLETE COMPARISON:  CT of the abdomen without contrast on 07/22/2018 FINDINGS: Gallbladder: No gallstones or wall thickening visualized. No sonographic Murphy sign noted by sonographer. Common bile duct: Diameter: 5 mm. Liver: The liver demonstrates coarse echotexture and increased echogenicity, likely reflecting diffuse steatosis. No overt cirrhotic contour abnormalities or focal lesions are identified. There is no evidence of intrahepatic biliary ductal dilatation. Left lobe hepatic cyst shows mild internal septation and measures approximately 2.6 cm in greatest diameter. No solid masses appreciated by ultrasound. Portal vein is patent on color Doppler imaging with normal direction of blood flow towards the liver. IVC: No abnormality visualized. Pancreas: Visualized portion unremarkable. Spleen: Size and appearance within normal limits. Right Kidney: Length: 12.6 cm. Echogenicity within normal limits. No solid mass or hydronephrosis visualized. 3 cm cyst of the upper pole has a benign appearance by ultrasound. Left Kidney: Length: 12.9 cm. Echogenicity within normal limits. No mass or hydronephrosis visualized. Abdominal aorta: No aneurysm visualized. Other findings: None. IMPRESSION: Increased echogenicity of the liver parenchyma consistent with diffuse steatosis. No evidence of biliary obstruction. Simple cyst in the left lobe of the liver. Electronically Signed  By: Aletta Edouard M.D.   On: 07/23/2018 08:28   Dg Chest Port 1 View  Result Date: 07/22/2018 CLINICAL DATA:  Per EMS-states recurrent UTI and has been on 3 different antibiotics and not getting better, possible sepsis. EXAM: PORTABLE CHEST - 1 VIEW COMPARISON:  07/01/2018 and previous FINDINGS: Relatively low lung volumes. Mild atelectasis at the lung bases. Heart size upper limits normal for technique. Aortic Atherosclerosis (ICD10-170.0). Blunting of right lateral costophrenic angle, cannot exclude small effusion. Bilateral  shoulder DJD. IMPRESSION: 1. Low lung volumes with bibasilar atelectasis. 2. Possible small right effusion. Electronically Signed   By: Lucrezia Europe M.D.   On: 07/22/2018 10:57   Dg Fluoro Guided Needle Plc Aspiration/injection Loc  Result Date: 07/27/2018 CLINICAL DATA:  Confusion.  Unable to move arms in legs. EXAM: DIAGNOSTIC LUMBAR PUNCTURE UNDER FLUOROSCOPIC GUIDANCE FLUOROSCOPY TIME:  Fluoroscopy Time:  42 seconds Radiation Exposure Index (if provided by the fluoroscopic device): Is 16.2 mGy Number of Acquired Spot Images: 0 PROCEDURE: Informed consent was obtained from the patient prior to the procedure, including potential complications of headache, allergy, and pain. With the patient prone, the lower back was prepped with Betadine. 1% Lidocaine was used for local anesthesia. Lumbar puncture was performed initially at the surgical level at L4-5 using a 20 gauge needle. There was slow return of blood tinged fluid. Opening pressure very low, approximately 12-13 cm of water. Very little fluid could be collected due to the very low pressures despite tilting the head up. Therefore, the needle was repositioned at a 2nd level at L3-4. Same blood tension fluid returned, with same low pressure. Very little fluid could be collected. A total of approximately 1 mL of blood-tinged cloudy fluid was collected. Fluid was sent for laboratory testing. The patient tolerated the procedure well and there were no apparent complications. IMPRESSION: Difficult lumbar puncture under fluoroscopic guidance due to very low pressures. Only a small amount of blood-tinged cloudy fluid could be collected despite tilting head significantly upward. Electronically Signed   By: Rolm Baptise M.D.   On: 07/27/2018 14:47   Ct Renal Stone Study  Result Date: 07/22/2018 CLINICAL DATA:  Flank pain.  Urinary tract infection. EXAM: CT ABDOMEN AND PELVIS WITHOUT CONTRAST TECHNIQUE: Multidetector CT imaging of the abdomen and pelvis was performed  following the standard protocol without IV contrast. COMPARISON:  06/29/2018 FINDINGS: Lower chest: Small bilateral pleural effusions with dependent atelectasis. Hepatobiliary: Diffuse hepatic steatosis. Lobulated cyst in the lateral segment of the left lobe of the liver is stable. Gallbladder is within normal limits. Pancreas: Unremarkable Spleen: Unremarkable Adrenals/Urinary Tract: There is no hydronephrosis. No urinary calculus. There are simple cysts in both kidneys. These are not significantly changed. Bladder is grossly within normal limits. Stomach/Bowel: Sigmoid diverticulosis. No obvious mass in the colon. No evidence of small-bowel obstruction. Small hiatal hernia is noted. Vascular/Lymphatic: Atherosclerotic vascular calcifications are seen. No abnormal retroperitoneal adenopathy. Reproductive: Normal prostate. Other: No free fluid. Musculoskeletal: No vertebral compression deformity. Postoperative changes from L5 decompression. IMPRESSION: No evidence of urinary calculus or urinary obstruction. Small pleural effusions and dependent atelectasis. Chronic changes are noted. Electronically Signed   By: Marybelle Killings M.D.   On: 07/22/2018 16:10       Subjective: Patient is not interactive, sedated, most information from his family at the bedside. No apparent pain or dyspnea. Patient's family prefer patient to keep foley catheter.   Discharge Exam: Vitals:   07/30/18 0820 07/30/18 2101  BP:  (!) 142/78  Pulse:  81  Resp:  18  Temp: (!) 100.5 F (38.1 C)   SpO2:  94%   Vitals:   07/29/18 2124 07/30/18 0532 07/30/18 0820 07/30/18 2101  BP: (!) 155/101 (!) 163/96  (!) 142/78  Pulse: 94 95  81  Resp: 16 15  18   Temp: 99.1 F (37.3 C) (!) 101.2 F (38.4 C) (!) 100.5 F (38.1 C)   TempSrc: Oral Axillary Axillary   SpO2: 91% 93%  94%  Weight:      Height:        General: sedated and ill looking appearing, not in distress.  Neurology: not responsive E ENT: positive pallor, no  icterus, oral mucosa moist Cardiovascular: No JVD. S1-S2 present, rhythmic, no gallops, rubs, or murmurs. No lower extremity edema. Pulmonary: positive breath sounds bilaterally, poor inspiratory effort, no wheezing, rhonchi or rales. Gastrointestinal. Abdomen with no organomegaly, non tender, no rebound or guarding Skin. No rashes Musculoskeletal: no joint deformities  The results of significant diagnostics from this hospitalization (including imaging, microbiology, ancillary and laboratory) are listed below for reference.     Microbiology: Recent Results (from the past 240 hour(s))  Culture, blood (Routine x 2)     Status: Abnormal   Collection Time: 07/22/18 10:05 AM  Result Value Ref Range Status   Specimen Description BLOOD BLOOD RIGHT FOREARM  Final   Special Requests   Final    BOTTLES DRAWN AEROBIC AND ANAEROBIC Blood Culture adequate volume Performed at [facility] [address] [place], [place] [address]    Culture  Setup Time   Final    IN BOTH AEROBIC AND ANAEROBIC BOTTLES GRAM POSITIVE COCCI CRITICAL RESULT CALLED TO, READ BACK BY AND VERIFIED WITHLavell Luster PHARMD [id number] 07/23/18 A BROWNING    Culture STAPHYLOCOCCUS AUREUS (A)  Final   Report Status 07/25/2018 FINAL  Final   Organism ID, Bacteria STAPHYLOCOCCUS AUREUS  Final      Susceptibility   Staphylococcus aureus - MIC*    CIPROFLOXACIN <=0.5 SENSITIVE Sensitive     ERYTHROMYCIN <=0.25 SENSITIVE Sensitive     GENTAMICIN <=0.5 SENSITIVE Sensitive     OXACILLIN <=0.25 SENSITIVE Sensitive     TETRACYCLINE <=1 SENSITIVE Sensitive     VANCOMYCIN <=0.5 SENSITIVE Sensitive     TRIMETH/SULFA 160 RESISTANT Resistant     CLINDAMYCIN <=0.25 SENSITIVE Sensitive     RIFAMPIN <=0.5 SENSITIVE Sensitive     Inducible Clindamycin NEGATIVE Sensitive     * STAPHYLOCOCCUS AUREUS  Blood Culture ID Panel (Reflexed)     Status: Abnormal   Collection Time: 07/22/18 10:05 AM  Result Value Ref Range Status    Enterococcus species NOT DETECTED NOT DETECTED Final   Listeria monocytogenes NOT DETECTED NOT DETECTED Final   Staphylococcus species DETECTED (A) NOT DETECTED Final    Comment: CRITICAL RESULT CALLED TO, READ BACK BY AND VERIFIED WITHLavell Luster PHARMD [id number] 07/23/18 A BROWNING    Staphylococcus aureus (BCID) DETECTED (A) NOT DETECTED Final    Comment: Methicillin (oxacillin) susceptible Staphylococcus aureus (MSSA). Preferred therapy is anti staphylococcal beta lactam antibiotic (Cefazolin or Nafcillin), unless clinically contraindicated. CRITICAL RESULT CALLED TO, READ BACK BY AND VERIFIED WITHLavell Luster PHARMD [id number] 07/23/18 A BROWNING    Methicillin resistance NOT DETECTED NOT DETECTED Final   Streptococcus species NOT DETECTED NOT DETECTED Final   Streptococcus agalactiae NOT DETECTED NOT DETECTED Final   Streptococcus pneumoniae NOT DETECTED NOT DETECTED Final   Streptococcus pyogenes NOT DETECTED NOT DETECTED Final  Acinetobacter baumannii NOT DETECTED NOT DETECTED Final   Enterobacteriaceae species NOT DETECTED NOT DETECTED Final   Enterobacter cloacae complex NOT DETECTED NOT DETECTED Final   Escherichia coli NOT DETECTED NOT DETECTED Final   Klebsiella oxytoca NOT DETECTED NOT DETECTED Final   Klebsiella pneumoniae NOT DETECTED NOT DETECTED Final   Proteus species NOT DETECTED NOT DETECTED Final   Serratia marcescens NOT DETECTED NOT DETECTED Final   Haemophilus influenzae NOT DETECTED NOT DETECTED Final   Neisseria meningitidis NOT DETECTED NOT DETECTED Final   Pseudomonas aeruginosa NOT DETECTED NOT DETECTED Final   Candida albicans NOT DETECTED NOT DETECTED Final   Candida glabrata NOT DETECTED NOT DETECTED Final   Candida krusei NOT DETECTED NOT DETECTED Final   Candida parapsilosis NOT DETECTED NOT DETECTED Final   Candida tropicalis NOT DETECTED NOT DETECTED Final    Comment: Performed at [facility], [place] [address], [place], [place] [address]  Culture, blood  (Routine x 2)     Status: Abnormal   Collection Time: 07/22/18 11:45 AM  Result Value Ref Range Status   Specimen Description   Final    BLOOD LEFT ANTECUBITAL Performed at [facility] [address] [place], [place] [address]    Special Requests   Final    BOTTLES DRAWN AEROBIC AND ANAEROBIC Blood Culture adequate volume Performed at [facility] [address] [place], [place] [address]    Culture  Setup Time   Final    GRAM POSITIVE COCCI IN BOTH AEROBIC AND ANAEROBIC BOTTLES CRITICAL VALUE NOTED.  VALUE IS CONSISTENT WITH PREVIOUSLY REPORTED AND CALLED VALUE. Performed at [facility], [place] [address] [place], [place] [address]    Culture (A)  Final    STAPHYLOCOCCUS AUREUS SUSCEPTIBILITIES PERFORMED ON PREVIOUS CULTURE WITHIN THE LAST 5 DAYS.    Report Status 07/25/2018 FINAL  Final  Urine culture     Status: Abnormal   Collection Time: 07/22/18 12:36 PM  Result Value Ref Range Status   Specimen Description   Final    URINE, CATHETERIZED Performed at [facility], [place] [address], [place], [place] [address]    Special Requests   Final    Normal Performed at [facility], [place] [address] [place], [place] [address]    Culture >=100,000 COLONIES/mL STAPHYLOCOCCUS AUREUS (A)  Final   Report Status 07/24/2018 FINAL  Final   Organism ID, Bacteria STAPHYLOCOCCUS AUREUS (A)  Final      Susceptibility   Staphylococcus aureus - MIC*    CIPROFLOXACIN <=0.5 SENSITIVE Sensitive     GENTAMICIN <=0.5 SENSITIVE Sensitive     NITROFURANTOIN <=16 SENSITIVE Sensitive     OXACILLIN <=0.25 SENSITIVE Sensitive     TETRACYCLINE <=1 SENSITIVE Sensitive     VANCOMYCIN <=0.5 SENSITIVE Sensitive     TRIMETH/SULFA 80 RESISTANT Resistant     CLINDAMYCIN <=0.25 SENSITIVE Sensitive     RIFAMPIN <=0.5 SENSITIVE Sensitive     Inducible Clindamycin NEGATIVE Sensitive     * >=100,000 COLONIES/mL STAPHYLOCOCCUS  AUREUS  MRSA PCR Screening     Status: Abnormal   Collection Time: 07/23/18  2:03 AM  Result Value Ref Range Status   MRSA by PCR POSITIVE (A) NEGATIVE Final    Comment:        The GeneXpert MRSA Assay (FDA approved for NASAL specimens only), is one component of a comprehensive MRSA colonization surveillance program. It is not intended to diagnose MRSA infection nor to  guide or monitor treatment for MRSA infections. RESULT CALLED TO, READ BACK BY AND VERIFIED WITH: HILL,D RN @0353  ON 07/23/18 JACKSON,K Performed at [facility], [place] [address], [place], [place] [address]   Culture, blood (routine x 2)     Status: None   Collection Time: 07/25/18  5:04 PM  Result Value Ref Range Status   Specimen Description   Final    BLOOD LEFT HAND Performed at [facility] [address] [place], [place] [address]    Special Requests   Final    BOTTLES DRAWN AEROBIC AND ANAEROBIC Blood Culture adequate volume Performed at [facility] [address], [place], [place] [address]    Culture   Final    NO GROWTH 5 DAYS Performed at [facility], [place] [address], [place], [place] [address]    Report Status 07/30/2018 FINAL  Final  Culture, blood (routine x 2)     Status: None   Collection Time: 07/25/18  5:21 PM  Result Value Ref Range Status   Specimen Description   Final    BLOOD LEFT HAND Performed at [facility] [address] [place], [place] [address]    Special Requests   Final    BOTTLES DRAWN AEROBIC AND ANAEROBIC Blood Culture adequate volume Performed at [facility] [address], [place], [place] [address]    Culture   Final    NO GROWTH 5 DAYS Performed at [facility], [place] [address], [place], [place] [address]    Report Status 07/30/2018 FINAL  Final  CSF culture     Status: None   Collection Time: 07/27/18  2:29 PM  Result Value Ref Range Status    Specimen Description   Final    CSF Performed at [facility] [address], [place], [place] [address]    Special Requests   Final    NONE Performed at [facility], [place] [address], [place], [place] [address]    Gram Stain   Final    NO WBC SEEN NO ORGANISMS SEEN CYTOSPIN SMEAR Gram Stain Report Called to,Read Back By and Verified With: C.TRAN AT 1559 ON 07/27/18 BY N.THOMPSON Performed at [facility], [place] [address] [place], [place] [address]    Culture   Final    NO GROWTH 3 DAYS Performed at [facility], [place] [address] [place], [place] [address]    Report Status 07/31/2018 FINAL  Final     Labs: BNP (last 3 results) No results for input(s): BNP in the last 8760 hours. Basic Metabolic Panel: Recent Labs  Lab 07/26/18 0533 07/26/18 0633 07/27/18 1009  NA 148* 147* 145  K 3.7 3.6 3.6  CL 115* 114* 113*  CO2 25 25 24   GLUCOSE 153* 163* 221*  BUN 26* 27* 40*  CREATININE 0.69 0.72 0.75  CALCIUM 8.4* 8.4* 8.1*   Liver Function Tests: Recent Labs  Lab 07/26/18 0533 07/27/18 1009  AST 72* 66*  ALT 87* 61*  ALKPHOS 95 78  BILITOT 0.9 0.6  PROT 5.6* 5.5*  ALBUMIN 1.7* 1.8*   No results for input(s): LIPASE, AMYLASE in the last 168 hours. No results for input(s): AMMONIA in the last 168 hours. CBC: Recent Labs  Lab 07/26/18 0533 07/26/18 0633 07/27/18 1009  WBC 13.9* 14.4* 12.6*  HGB 11.4* 11.0* 10.6*  HCT 37.4* 36.1* 35.2*  MCV 107.8* 108.4* 110.0*  PLT 233 220 219   Cardiac Enzymes: Recent Labs  Lab 07/26/18 0533  CKTOTAL 48*   BNP: Invalid input(s): POCBNP CBG: No results for input(s): GLUCAP in the last 168 hours. D-Dimer No results for input(s): DDIMER in the last 72 hours. Hgb A1c No results for input(s): HGBA1C in the last 72 hours. Lipid Profile No results for input(s): CHOL, HDL, LDLCALC, TRIG, CHOLHDL, LDLDIRECT in the last 72 hours. Thyroid function studies No  results for input(s): TSH, T4TOTAL, T3FREE, THYROIDAB in the last 72 hours.  Invalid input(s): FREET3 Anemia work up No results for input(s): VITAMINB12, FOLATE, FERRITIN, TIBC, IRON, RETICCTPCT in the last 72 hours. Urinalysis    Component Value Date/Time   COLORURINE AMBER (A) 07/22/2018 1235   APPEARANCEUR CLOUDY (A) 07/22/2018 1235   LABSPEC 1.023 07/22/2018 1235   PHURINE 6.0 07/22/2018 1235   GLUCOSEU NEGATIVE 07/22/2018 1235   HGBUR LARGE (A) 07/22/2018 1235   BILIRUBINUR NEGATIVE 07/22/2018 1235   KETONESUR NEGATIVE 07/22/2018 1235   PROTEINUR 100 (A) 07/22/2018 1235   NITRITE POSITIVE (A) 07/22/2018 1235   LEUKOCYTESUR LARGE (A) 07/22/2018 1235   Sepsis Labs Invalid input(s): PROCALCITONIN,  WBC,  LACTICIDVEN Microbiology Recent Results (from the past 240 hour(s))  Culture, blood (Routine x 2)     Status: Abnormal   Collection Time: 07/22/18 10:05 AM  Result Value Ref Range Status   Specimen Description BLOOD BLOOD RIGHT FOREARM  Final   Special Requests   Final    BOTTLES DRAWN AEROBIC AND ANAEROBIC Blood Culture adequate volume Performed at [facility], [place] [address] [place], [place] [address]    Culture  Setup Time   Final    IN BOTH AEROBIC AND ANAEROBIC BOTTLES GRAM POSITIVE COCCI CRITICAL RESULT CALLED TO, READ BACK BY AND VERIFIED WITHLavell Luster PHARMD 0623 07/23/18 A BROWNING    Culture STAPHYLOCOCCUS AUREUS (A)  Final   Report Status 07/25/2018 FINAL  Final   Organism ID, Bacteria STAPHYLOCOCCUS AUREUS  Final      Susceptibility   Staphylococcus aureus - MIC*    CIPROFLOXACIN <=0.5 SENSITIVE Sensitive     ERYTHROMYCIN <=0.25 SENSITIVE Sensitive     GENTAMICIN <=0.5 SENSITIVE Sensitive     OXACILLIN <=0.25 SENSITIVE Sensitive     TETRACYCLINE <=1 SENSITIVE Sensitive     VANCOMYCIN <=0.5 SENSITIVE Sensitive     TRIMETH/SULFA 160 RESISTANT Resistant     CLINDAMYCIN <=0.25 SENSITIVE Sensitive     RIFAMPIN <=0.5 SENSITIVE Sensitive      Inducible Clindamycin NEGATIVE Sensitive     * STAPHYLOCOCCUS AUREUS  Blood Culture ID Panel (Reflexed)     Status: Abnormal   Collection Time: 07/22/18 10:05 AM  Result Value Ref Range Status   Enterococcus species NOT DETECTED NOT DETECTED Final   Listeria monocytogenes NOT DETECTED NOT DETECTED Final   Staphylococcus species DETECTED (A) NOT DETECTED Final    Comment: CRITICAL RESULT CALLED TO, READ BACK BY AND VERIFIED WITHLavell Luster PHARMD [id number] 07/23/18 A BROWNING    Staphylococcus aureus (BCID) DETECTED (A) NOT DETECTED Final    Comment: Methicillin (oxacillin) susceptible Staphylococcus aureus (MSSA). Preferred therapy is anti staphylococcal beta lactam antibiotic (Cefazolin or Nafcillin), unless clinically contraindicated. CRITICAL RESULT CALLED TO, READ BACK BY AND VERIFIED WITHLavell Luster PHARMD [id number] 07/23/18 A BROWNING    Methicillin resistance NOT DETECTED NOT DETECTED Final   Streptococcus species NOT DETECTED NOT DETECTED Final   Streptococcus agalactiae NOT DETECTED NOT DETECTED Final   Streptococcus pneumoniae NOT  DETECTED NOT DETECTED Final   Streptococcus pyogenes NOT DETECTED NOT DETECTED Final   Acinetobacter baumannii NOT DETECTED NOT DETECTED Final   Enterobacteriaceae species NOT DETECTED NOT DETECTED Final   Enterobacter cloacae complex NOT DETECTED NOT DETECTED Final   Escherichia coli NOT DETECTED NOT DETECTED Final   Klebsiella oxytoca NOT DETECTED NOT DETECTED Final   Klebsiella pneumoniae NOT DETECTED NOT DETECTED Final   Proteus species NOT DETECTED NOT DETECTED Final   Serratia marcescens NOT DETECTED NOT DETECTED Final   Haemophilus influenzae NOT DETECTED NOT DETECTED Final   Neisseria meningitidis NOT DETECTED NOT DETECTED Final   Pseudomonas aeruginosa NOT DETECTED NOT DETECTED Final   Candida albicans NOT DETECTED NOT DETECTED Final   Candida glabrata NOT DETECTED NOT DETECTED Final   Candida krusei NOT DETECTED NOT DETECTED Final   Candida  parapsilosis NOT DETECTED NOT DETECTED Final   Candida tropicalis NOT DETECTED NOT DETECTED Final    Comment: Performed at [facility], [place] [address], [place], [place] [address]  Culture, blood (Routine x 2)     Status: Abnormal   Collection Time: 07/22/18 11:45 AM  Result Value Ref Range Status   Specimen Description   Final    BLOOD LEFT ANTECUBITAL Performed at [facility] [address], [place], [place] [address]    Special Requests   Final    BOTTLES DRAWN AEROBIC AND ANAEROBIC Blood Culture adequate volume Performed at [facility] [address], [place], [place] [address]    Culture  Setup Time   Final    GRAM POSITIVE COCCI IN BOTH AEROBIC AND ANAEROBIC BOTTLES CRITICAL VALUE NOTED.  VALUE IS CONSISTENT WITH PREVIOUSLY REPORTED AND CALLED VALUE. Performed at [facility], [place] [address] [place], [place] [address]    Culture (A)  Final    STAPHYLOCOCCUS AUREUS SUSCEPTIBILITIES PERFORMED ON PREVIOUS CULTURE WITHIN THE LAST 5 DAYS.    Report Status 07/25/2018 FINAL  Final  Urine culture     Status: Abnormal   Collection Time: 07/22/18 12:36 PM  Result Value Ref Range Status   Specimen Description   Final    URINE, CATHETERIZED Performed at [facility], [place] [address], [place], [place] [address]    Special Requests   Final    Normal Performed at [facility], [place] [address] [place], [place] [address]    Culture >=100,000 COLONIES/mL STAPHYLOCOCCUS AUREUS (A)  Final   Report Status 07/24/2018 FINAL  Final   Organism ID, Bacteria STAPHYLOCOCCUS AUREUS (A)  Final      Susceptibility   Staphylococcus aureus - MIC*    CIPROFLOXACIN <=0.5 SENSITIVE Sensitive     GENTAMICIN <=0.5 SENSITIVE Sensitive     NITROFURANTOIN <=16 SENSITIVE Sensitive     OXACILLIN <=0.25 SENSITIVE Sensitive     TETRACYCLINE <=1 SENSITIVE Sensitive     VANCOMYCIN <=0.5 SENSITIVE Sensitive      TRIMETH/SULFA 80 RESISTANT Resistant     CLINDAMYCIN <=0.25 SENSITIVE Sensitive     RIFAMPIN <=0.5 SENSITIVE Sensitive     Inducible Clindamycin NEGATIVE Sensitive     * >=100,000 COLONIES/mL STAPHYLOCOCCUS AUREUS  MRSA PCR Screening     Status: Abnormal   Collection Time: 07/23/18  2:03 AM  Result Value Ref Range Status   MRSA by PCR POSITIVE (A) NEGATIVE Final    Comment:        The GeneXpert MRSA Assay (FDA approved for NASAL specimens only), is one component of a  comprehensive MRSA colonization surveillance program. It is not intended to diagnose MRSA infection nor to guide or monitor treatment for MRSA infections. RESULT CALLED TO, READ BACK BY AND VERIFIED WITH: HILL,D RN @0353  ON 07/23/18 JACKSON,K Performed at [facility], [place] [address] [place], [place] [address]   Culture, blood (routine x 2)     Status: None   Collection Time: 07/25/18  5:04 PM  Result Value Ref Range Status   Specimen Description   Final    BLOOD LEFT HAND Performed at [facility] [address], [place], [place] [address]    Special Requests   Final    BOTTLES DRAWN AEROBIC AND ANAEROBIC Blood Culture adequate volume Performed at [facility] [address] [place], [place] [address]    Culture   Final    NO GROWTH 5 DAYS Performed at [facility], [place] [address] [place], [place] [address]    Report Status 07/30/2018 FINAL  Final  Culture, blood (routine x 2)     Status: None   Collection Time: 07/25/18  5:21 PM  Result Value Ref Range Status   Specimen Description   Final    BLOOD LEFT HAND Performed at [facility] [address], [place], [place] [address]    Special Requests   Final    BOTTLES DRAWN AEROBIC AND ANAEROBIC Blood Culture adequate volume Performed at [facility] [address] [place], [place] [address]    Culture   Final    NO GROWTH 5 DAYS Performed at [facility], [place] [address] [place], [place] [address]    Report Status 07/30/2018 FINAL  Final  CSF culture     Status: None   Collection Time: 07/27/18  2:29 PM  Result Value Ref Range Status   Specimen Description   Final    CSF Performed at [facility] [address] [place], [place] [address]    Special Requests   Final    NONE Performed at [facility], [place] [address] [place], [place] [address]    Gram Stain   Final    NO WBC SEEN NO ORGANISMS SEEN CYTOSPIN SMEAR Gram Stain Report Called to,Read Back By and Verified With: C.TRAN AT 1559 ON 07/27/18 BY N.THOMPSON Performed at [facility], [place] [address], [place], [place] [address]    Culture   Final    NO GROWTH 3 DAYS Performed at [facility], [place] [address] [place],  [address]    Report Status 07/31/2018 FINAL  Final     Time coordinating discharge: 45 minutes  SIGNED:   Tawni Millers, MD  [facility] 07/31/2018, 1:31 PM Pager [phone]  If 7PM-7AM, please contact night-coverage [email or web address] Password [email or web address]

## 2018-07-31 NOTE — Progress Notes (Signed)
Patient was given 4 mg prior to transport to aid in comfort with movement.

## 2018-07-31 NOTE — Progress Notes (Signed)
Daily Progress Note   Patient Name: Ricky Black       Date: 07/31/2018 DOB: Oct 16, 1940  Age: 78 y.o. MRN#: [id number] Attending Physician: Tawni Millers Primary Care Physician: Marton Redwood, MD Admit Date: 07/22/2018  Reason for Consultation/Follow-up: Terminal Care  Subjective: Patient is less alert, appears comfortable at the moment   Daughters as well as wife are at bedside.   See below  Length of Stay: 9  Current Medications: Scheduled Meds:  . fluticasone  1 spray Each Nare Daily  . liver oil-zinc oxide   Topical QID  . oxymetazoline  1 spray Each Nare BID    Continuous Infusions:   PRN Meds: acetaminophen, antiseptic oral rinse, glycopyrrolate **OR** glycopyrrolate **OR** glycopyrrolate, haloperidol **OR** haloperidol **OR** haloperidol lactate, HYDROcodone-acetaminophen, ketorolac, LORazepam, morphine injection, ondansetron **OR** ondansetron (ZOFRAN) IV, polyvinyl alcohol  Physical Exam         Awake but not alert In mild distress  Clear breath sounds S 1 S 2  No edema Extremities in heel pads, warm to touch, no coolness, no mottling noted Abdomen is not distended  Vital Signs: BP (!) 142/78 (BP Location: Left Arm)   Pulse 81   Temp (!) 100.5 F (38.1 C) (Axillary)   Resp 18   Ht 6\' 2"  (1.88 m)   Wt 107.5 kg   SpO2 94%   BMI 30.43 kg/m  SpO2: SpO2: 94 % O2 Device: O2 Device: Room Air O2 Flow Rate: O2 Flow Rate (L/min): 2 L/min  Intake/output summary:   Intake/Output Summary (Last 24 hours) at 07/31/2018 1123 Last data filed at 07/31/2018 0600 Gross per 24 hour  Intake 600 ml  Output 900 ml  Net -300 ml   LBM: Last BM Date: 07/27/18 Baseline Weight: Weight: 107.5 kg Most recent weight: Weight: 107.5 kg       Palliative  Assessment/Data: PPS 10%   Flowsheet Rows     Most Recent Value  Intake Tab  Referral Department  Hospitalist  Unit at Time of Referral  Med/Surg Unit  Palliative Care Primary Diagnosis  Sepsis/Infectious Disease  Date Notified  07/25/18  Palliative Care Type  New Palliative care  Reason for referral  Clarify Goals of Care  Date of Admission  07/22/18  Date first seen by Palliative Care  07/26/18  # of days Palliative  referral response time  1 Day(s)  # of days IP prior to Palliative referral  3  Clinical Assessment  Psychosocial & Spiritual Assessment  Palliative Care Outcomes      Patient Active Problem List   Diagnosis Date Noted  . Goals of care, counseling/discussion   . Dying care   . Urinary tract infection without hematuria   . Staphylococcus aureus bacteremia   . Quadriplegia ([place])   . Abscess in epidural space of cervical spine   . Cervical discitis   . Neck pain   . DNR (do not resuscitate)   . Palliative care encounter   . Sepsis ([place]) 07/22/2018  . Acute lower UTI 07/22/2018  . Transaminitis 07/22/2018  . Acute encephalopathy 07/02/2018  . Acute cystitis with hematuria 07/02/2018  . Back pain 07/01/2018  . Delirium 07/01/2018  . Hematuria, microscopic 07/01/2018  . Bradycardia   . Dizziness   . Orthostatic hypotension   . Spinal arthritis   . Neuropathy   . Hypogonadism male   . Dementia ([employer])   . Abnormal EKG   . Essential hypertension 05/10/2007  . ARTHRITIS 05/10/2007    Palliative Care Assessment & Plan   Patient Profile:    Assessment:  sepsis Epidural abscess C3-C4 disc osteomyelitis MSSA bacteremia Acute metabolic encephalopathy Fever Generalized pain.    Recommendations/Plan:  Family meeting with patient's 2 daughters and wife at bedside this am, reviewed current comfort measures. Await hospice bed availability.   Morphine IV PRN to be used for comfort.   Will D/C IVF today, discussed with family.    Prognosis hours to  some very limited number of days discussed frankly but compassionately with daughters.    Chaplain consult for end of life care, prayer as per family request.    Code Status:    Code Status Orders  (From admission, onward)         Start     Ordered   07/29/18 1410  Do not attempt resuscitation (DNR)  Continuous    Question Answer Comment  In the event of cardiac or respiratory ARREST Do not call a "code blue"   In the event of cardiac or respiratory ARREST Do not perform Intubation, CPR, defibrillation or ACLS   In the event of cardiac or respiratory ARREST Use medication by any route, position, wound care, and other measures to relive pain and suffering. May use oxygen, suction and manual treatment of airway obstruction as needed for comfort.      07/29/18 1412        Code Status History    Date Active Date Inactive Code Status Order ID Comments User Context   07/26/2018 2751 07/29/2018 1412 DNR [id number]  Fuller Canada, PA-C Inpatient   07/22/2018 1525 07/26/2018 1245 Full Code [id number]  Edwin Dada, MD ED   07/02/2018 0448 07/04/2018 2323 Full Code [id number]  Rise Patience, MD Inpatient   07/01/2018 0106 07/01/2018 1946 Full Code [id number]  [name], DO ED       Prognosis:   < 2 weeks  Discharge Planning:  Hospice facility  Care plan was discussed with  Patient, RN, wife and 2 dtrs at bedside   Thank you for allowing the Palliative Medicine Team to assist in the care of this patient.   Time In: 10 Time Out: 10.25 Total Time 25 Prolonged Time Billed  no       Greater than 50%  of this time was spent counseling and coordinating care related  to the above assessment and plan.  Loistine Chance, MD [phone] Please contact Palliative Medicine Team phone at [phone] for questions and concerns.

## 2018-08-03 ENCOUNTER — Other Ambulatory Visit: Payer: Self-pay

## 2018-08-03 NOTE — Patient Outreach (Signed)
[facility]) Care Management  08/03/2018  TRAYVION EMBLETON 06/20/1941 [id number]    EMMI-GENERAL DISCHARGE  RED ON EMMI ALERT Day # 1 Date: 08/02/18  10:35 AM Red Alert Reason: "Scheduled follow-up? No", "Other questions/problems? Yes"   Outreach attempt # 1 unsuccessful.    Spoke with patients wife Anshul Meddings who answered the call and advised her husband has passed away.    Plan: RN CM will close case due to patient has expired. Note sent to [facility] CMA requesting future EMMI calls be discontinued.    Barb Merino, RN,CCM [facility] Care Management Care Management Coordinator Direct Phone: [phone] Toll Free: [phone] Fax: [phone]

## 2018-08-20 DEATH — deceased

## 2020-05-07 IMAGING — CT CT CERVICAL SPINE W/O CM
4 of 9 series · 9 of 33 positions shown, 10 images · non-contrast
Comparison: 06/30/2018

CLINICAL DATA: Altered mental status S. Fall. Urinary tract
infection. Confusion.

EXAM:
CT HEAD WITHOUT CONTRAST
CT CERVICAL SPINE WITHOUT CONTRAST
TECHNIQUE: Multidetector CT imaging of the head and cervical spine was
performed following the standard protocol without intravenous
contrast. Multiplanar CT image reconstructions of the cervical spine
were also generated.

[Series 10: c spine soft · axial · 0.26mm/px · z∈[-254,-194]mm · 2 of 90 slices shown]
[im 30/90  soft-tissue]
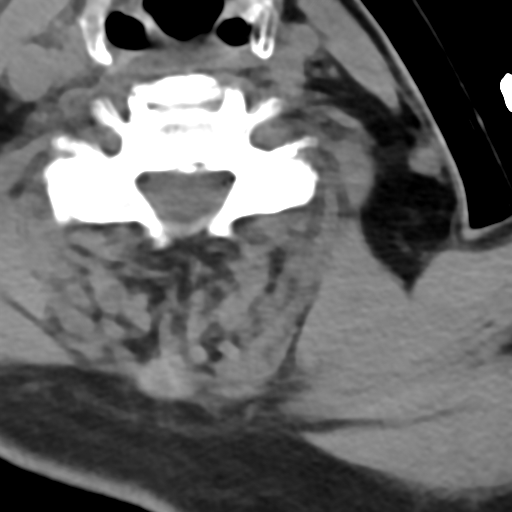
[im 60/90  soft-tissue]
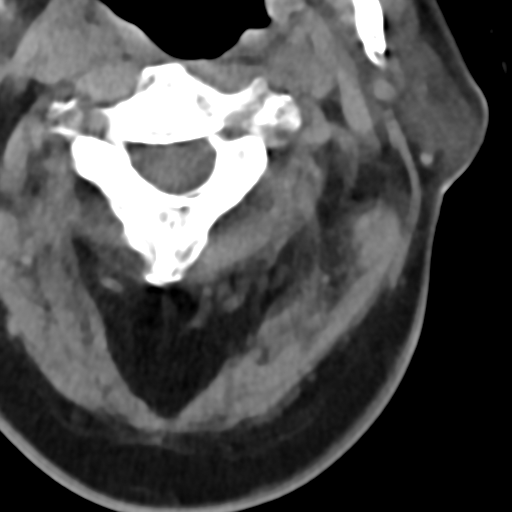

[Series 11: sag bone · sagittal · 0.26mm/px · 4 of 61 slices shown]
[im 13/61  bone]
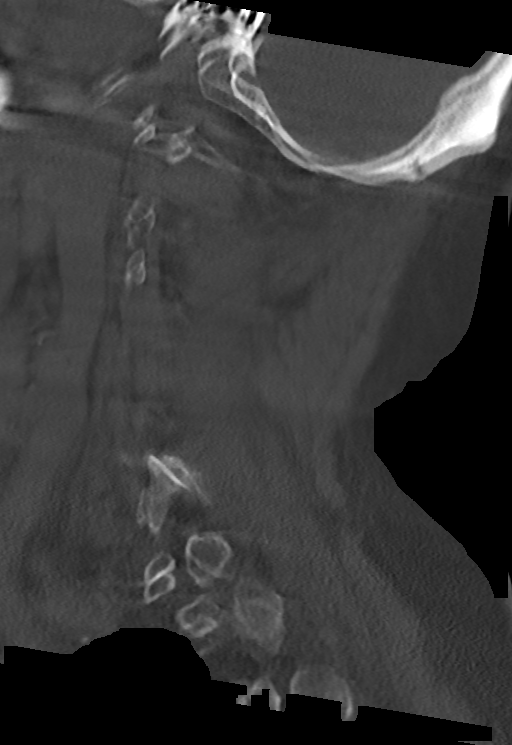
[im 25/61  bone]
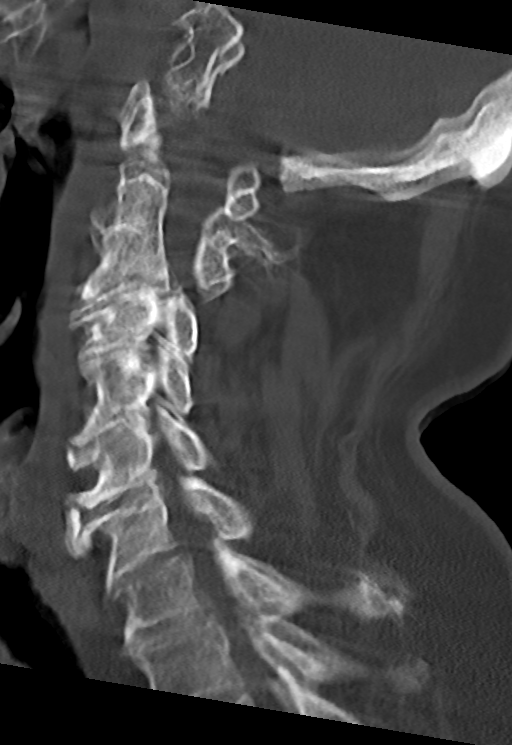
[im 37/61  bone]
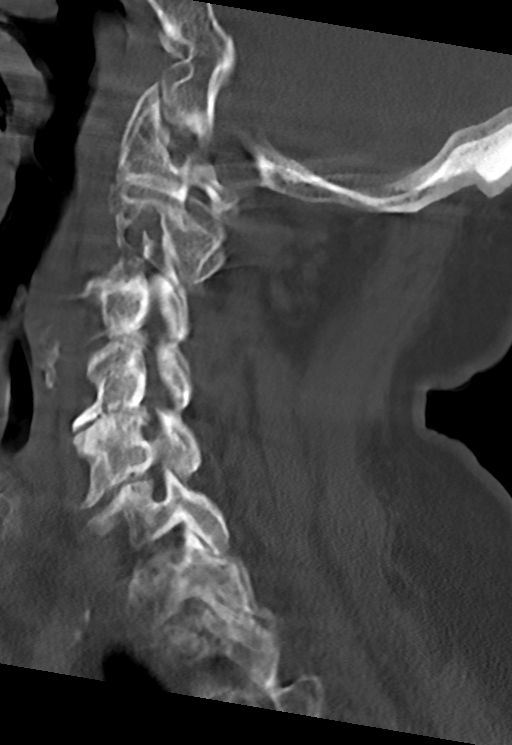
[im 49/61  bone]
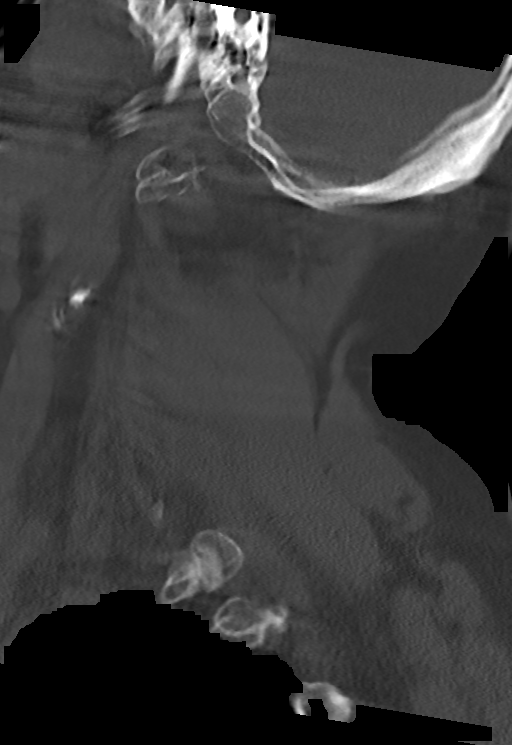

[Series 12: cor bone · coronal · 0.23mm/px · 1 of 61 slices shown]
[im 31/61  bone]
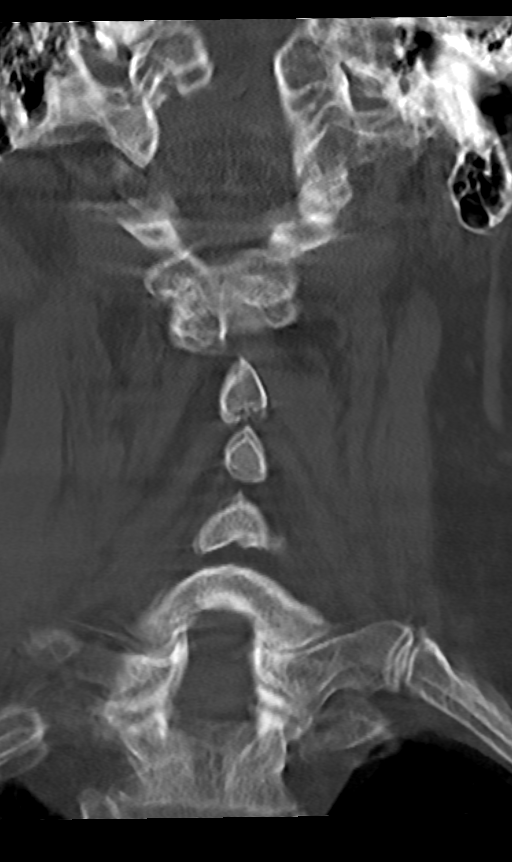

[Series 13: orthogonal axials · axial · 0.21mm/px · z∈[-278,-209]mm · 2 of 93 slices shown, 3 images]
[im 31/93  soft-tissue]
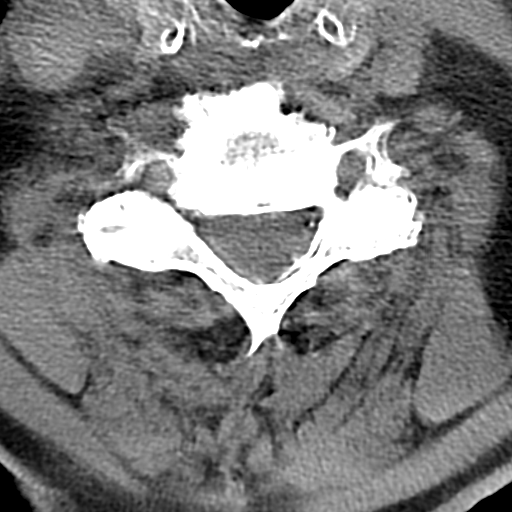
[im 31/93  bone]
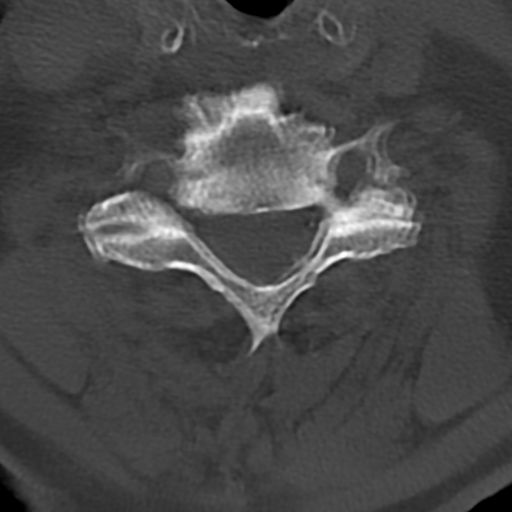
[im 62/93  bone]
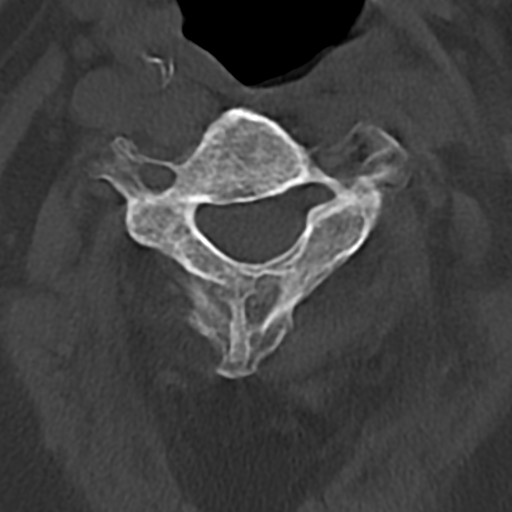

[9 of 33 positions shown; findings below may reference images not displayed]

FINDINGS: Despite efforts by the technologist and patient, motion artifact is
present on today's exam and could not be eliminated. This reduces
exam sensitivity and specificity.

CT HEAD FINDINGS

Brain: The brainstem, cerebellum, cerebral peduncles, thalami, basal
ganglia, basilar cisterns, and ventricular system appear within
normal limits. No intracranial hemorrhage, mass lesion, or acute
CVA. Overall no significant intracranial abnormality.

Vascular: Unremarkable

Skull: Unremarkable

Sinuses/Orbits: Chronic right maxillary and ethmoid sinusitis.

Other: No supplemental non-categorized findings.

CT CERVICAL SPINE FINDINGS

Alignment: No vertebral subluxation is observed.

Skull base and vertebrae: Reduced sensitivity due to motion, no
discrete fracture is identified. Fused C2 and C3 vertebra.

Soft tissues and spinal canal: Bilateral common carotid
atherosclerotic calcification.

Disc levels: Foraminal impingement at all cervical levels due to
uncinate spurring and facet arthropathy.

Upper chest: Unremarkable

Other: Served skip other
IMPRESSION: 1. No acute intracranial findings or acute cervical spine findings.
2. Chronic right maxillary and ethmoid sinusitis.
3. Reduced sensitivity due to motion artifact.
4. Foraminal impingement at all cervical levels due to uncinate
spurring and facet arthropathy.
5. Congenital fusion at C2-3.
